# Patient Record
Sex: Female | Born: 1980 | Race: White | Hispanic: Yes | Marital: Married | Smoking: Never smoker
Health system: Southern US, Community
[De-identification: ages and names within clinical notes are randomized; demographics above are authoritative.]

## PROBLEM LIST (undated history)

## (undated) DIAGNOSIS — E559 Vitamin D deficiency, unspecified: Secondary | ICD-10-CM

## (undated) DIAGNOSIS — L709 Acne, unspecified: Secondary | ICD-10-CM

## (undated) DIAGNOSIS — E785 Hyperlipidemia, unspecified: Secondary | ICD-10-CM

## (undated) HISTORY — DX: Acne, unspecified: L70.9

## (undated) HISTORY — DX: Hyperlipidemia, unspecified: E78.5

## (undated) HISTORY — DX: Vitamin D deficiency, unspecified: E55.9

---

## 2007-10-10 ENCOUNTER — Inpatient Hospital Stay (HOSPITAL_COMMUNITY): Admission: AD | Admit: 2007-10-10 | Discharge: 2007-10-10 | Payer: Self-pay | Admitting: Gynecology

## 2007-10-10 LAB — CONVERTED CEMR LAB: Chlamydia, DNA Probe: NEGATIVE

## 2007-10-10 IMAGING — US US OB COMP LESS 14 WK
1 series · 14 of 20 positions shown · non-contrast
Comparison: none

CLINICAL DATA: 7 weeks.  By dates, bleeding

OBSTETRIC <14 WK ULTRASOUND
TECHNIQUE: Transabdominal ultrasound was performed for evaluation
of the gestation as well as the maternal uterus and adnexal
regions.

[Series 1: us ob comp less 14 wk · 0.26mm/px · 14 of 20 slices shown]
[im 1/20]
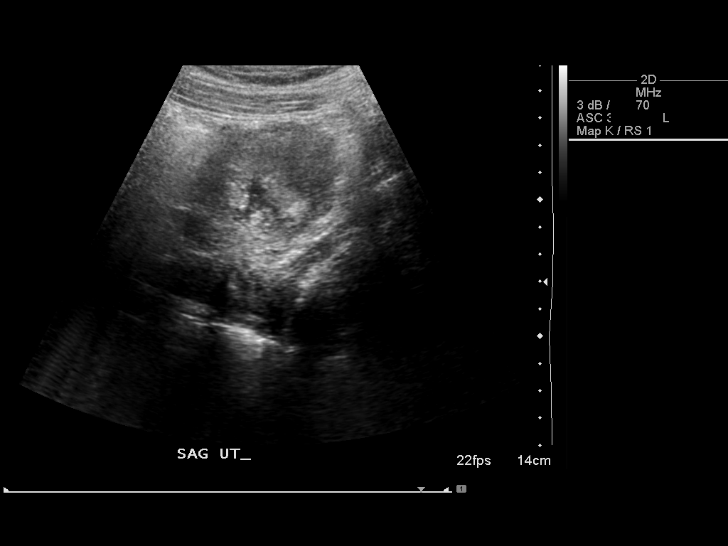
[im 3/20]
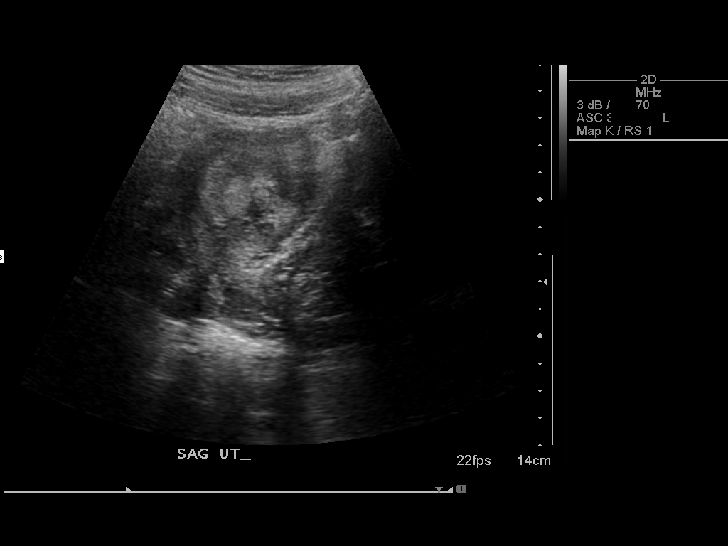
[im 4/20]
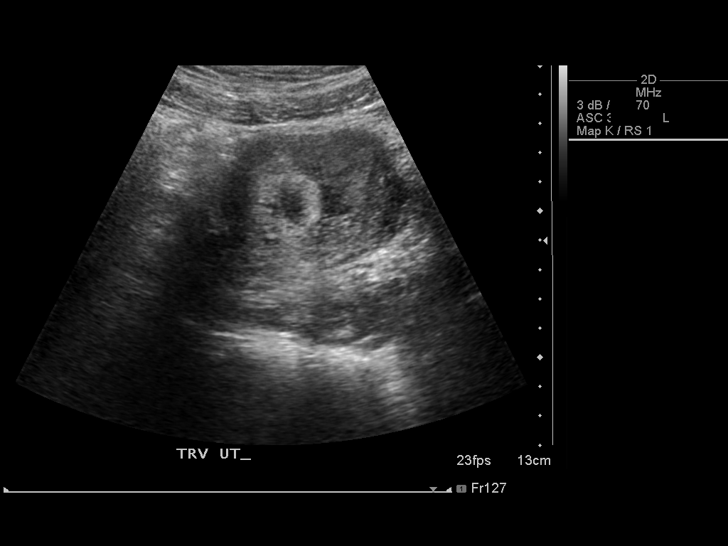
[im 6/20]
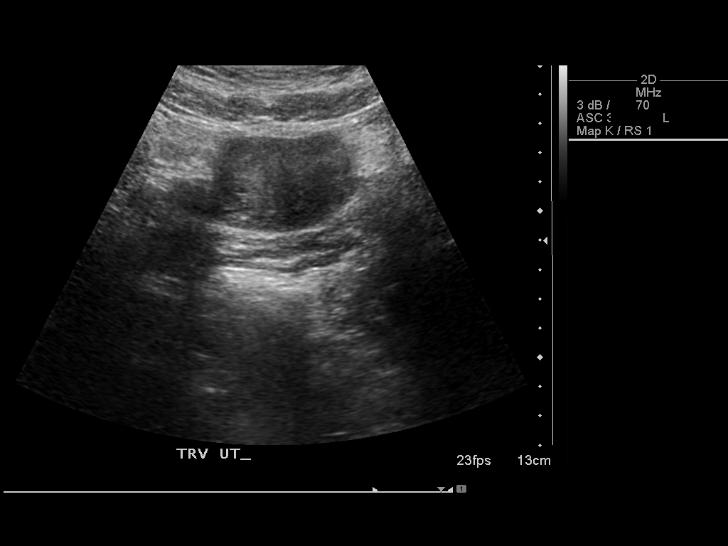
[im 7/20]
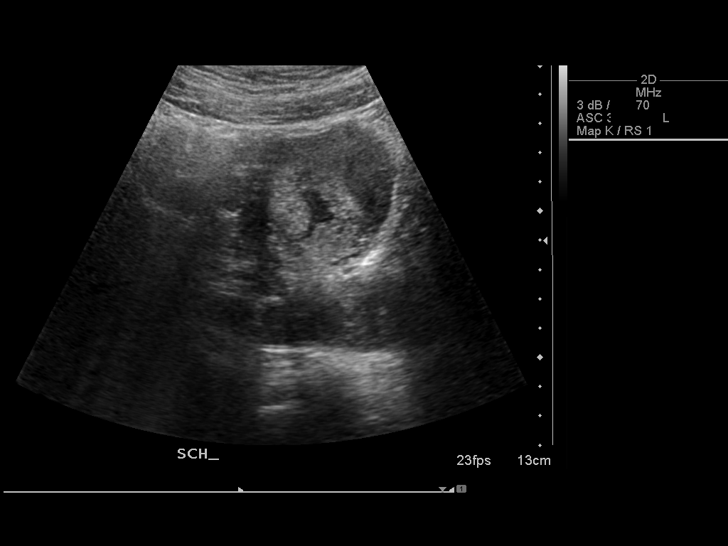
[im 8/20]
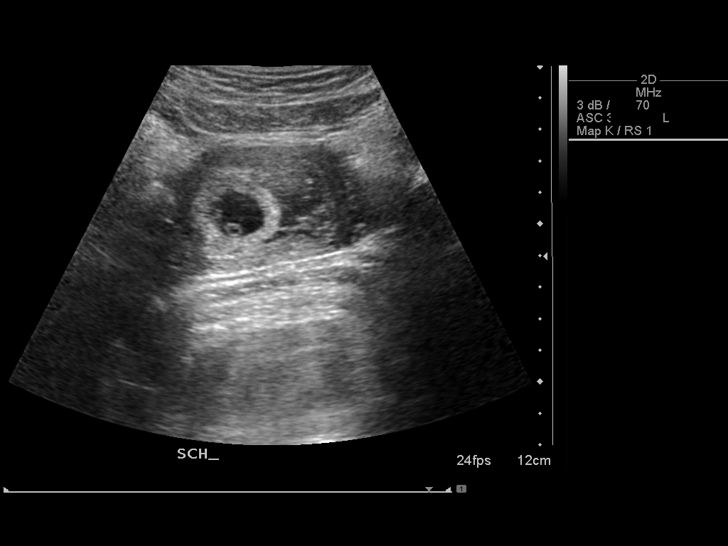
[im 10/20]
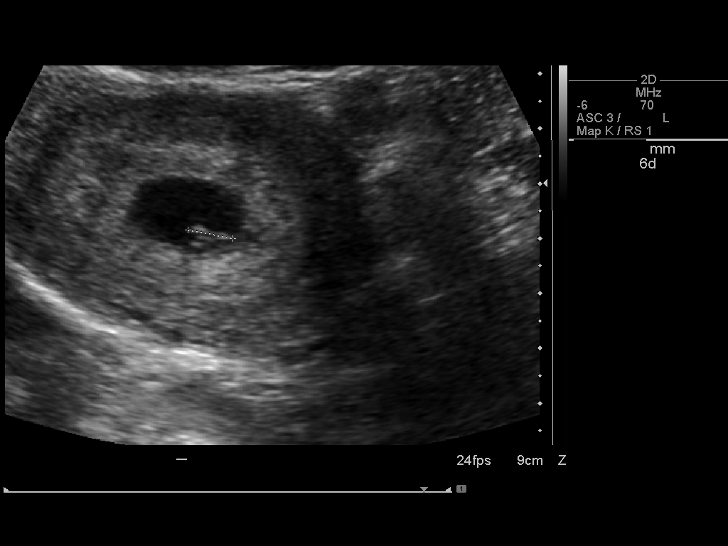
[im 11/20]
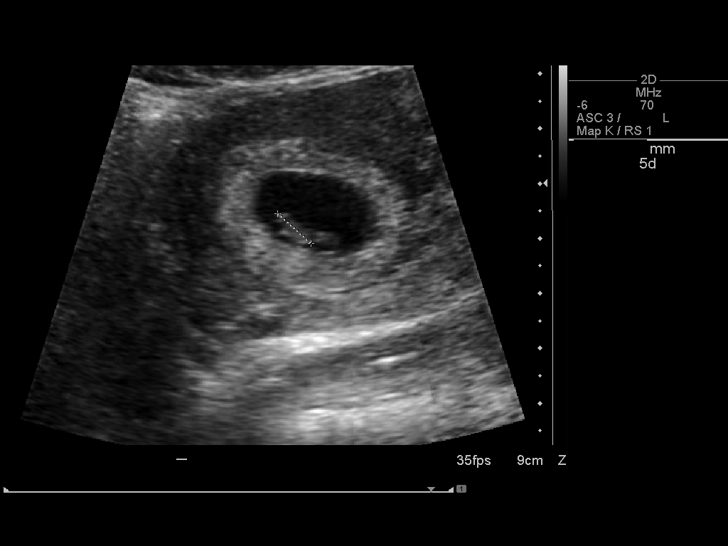
[im 13/20]
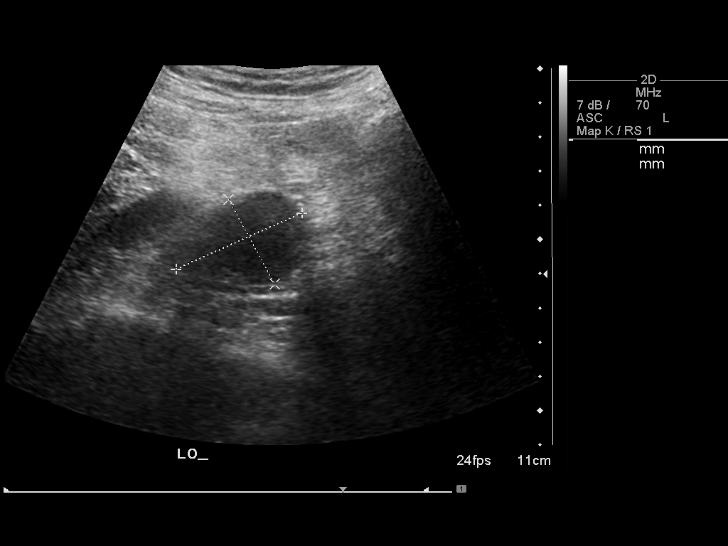
[im 14/20]
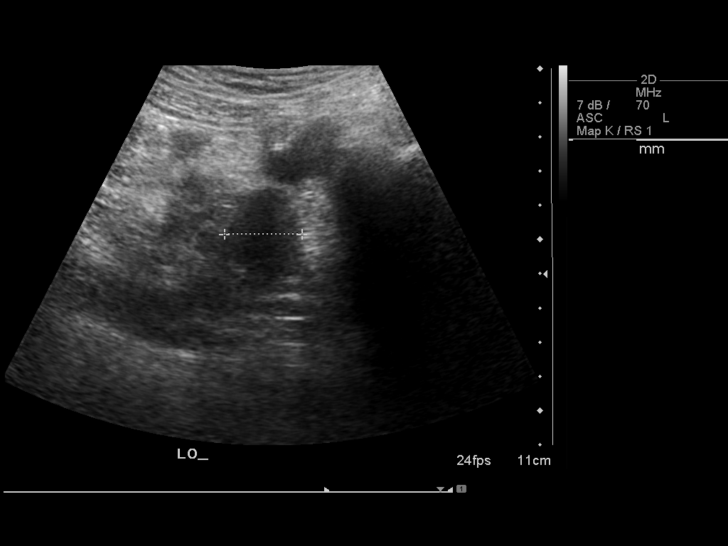
[im 16/20]
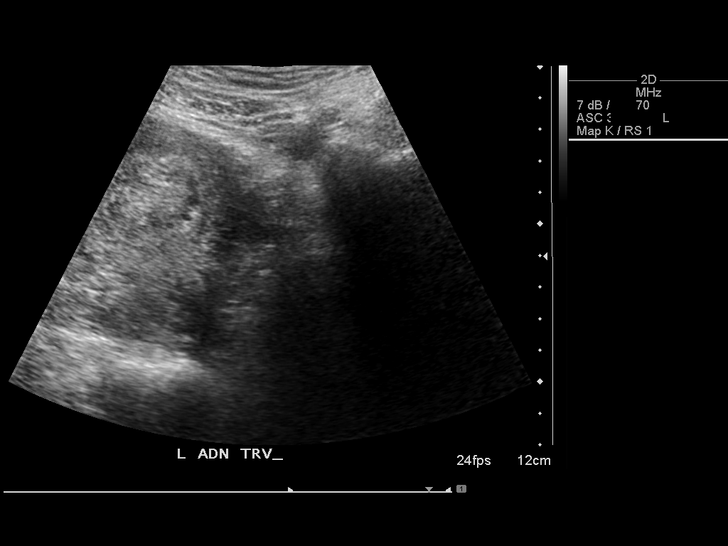
[im 17/20]
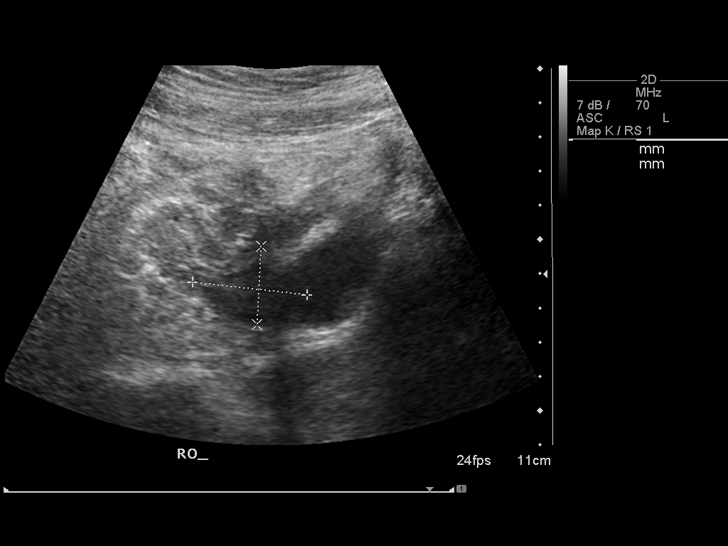
[im 18/20]
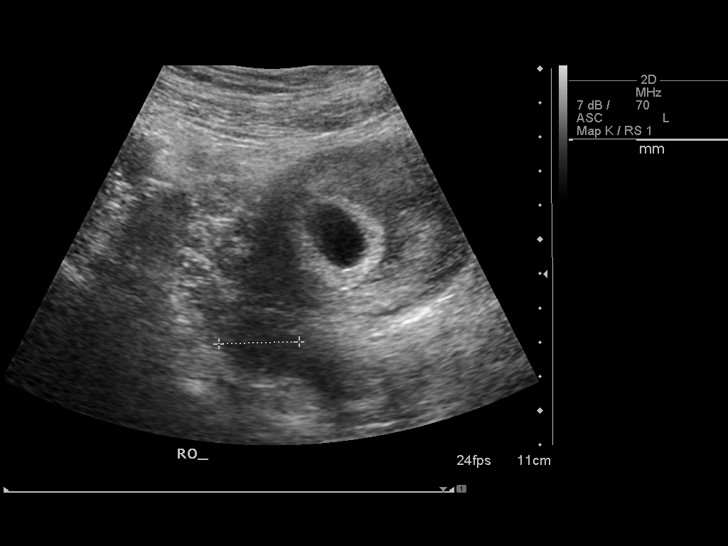
[im 20/20]
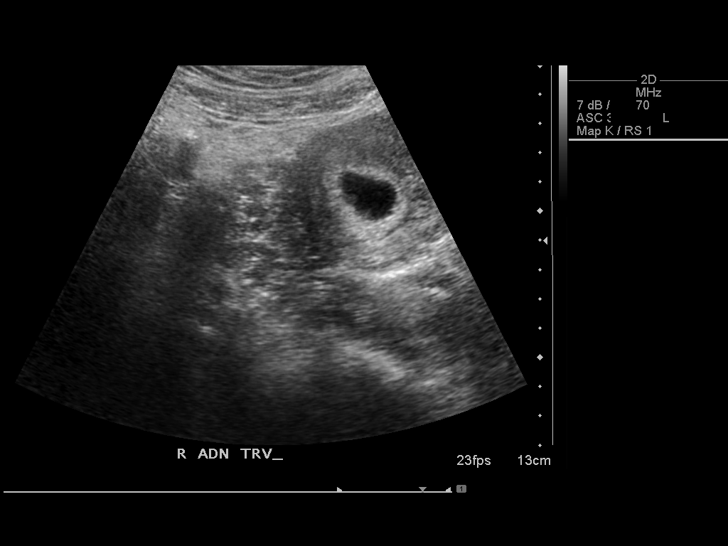

[14 of 20 positions shown; findings below may reference images not displayed]

A single intrauterine gestational sac is noted with yolk sac and
embryo.  Cardiac activity is detected measured at 126 beats per
minute.  By measurement of the crown rump length, estimated
gestational age is 6 weeks 6 days.

Intrauterine gestational sac: Single
Yolk sac: Yes
Embryo: Yes
Cardiac Activity: Yes
Heart Rate: 126 bpm

MSD: 8.3 mm  6w  6d
CRL:           w  d           US EDC:

Maternal uterus/Adnexae:
Small subchorionic hemorrhage is noted.  Both ovaries appear
normal.
IMPRESSION: Single living intrauterine fetal pole of the 6 weeks 6 days
gestation.

## 2007-12-09 ENCOUNTER — Ambulatory Visit: Payer: Self-pay | Admitting: Family Medicine

## 2007-12-09 ENCOUNTER — Encounter: Payer: Self-pay | Admitting: Family Medicine

## 2007-12-09 LAB — CONVERTED CEMR LAB
Antibody Screen: NEGATIVE
Basophils Absolute: 0 10*3/uL (ref 0.0–0.1)
Basophils Relative: 0 % (ref 0–1)
Eosinophils Absolute: 0.1 10*3/uL (ref 0.0–0.7)
Eosinophils Relative: 2 % (ref 0–5)
HCT: 38.3 % (ref 36.0–46.0)
Hepatitis B Surface Ag: NEGATIVE
Monocytes Absolute: 0.6 10*3/uL (ref 0.1–1.0)
Monocytes Relative: 7 % (ref 3–12)
Neutro Abs: 5.1 10*3/uL (ref 1.7–7.7)
Neutrophils Relative %: 68 % (ref 43–77)
Platelets: 217 10*3/uL (ref 150–400)
RDW: 14.1 % (ref 11.5–15.5)
Rubella: 88 intl units/mL — ABNORMAL HIGH

## 2007-12-16 ENCOUNTER — Encounter: Payer: Self-pay | Admitting: Family Medicine

## 2007-12-16 ENCOUNTER — Ambulatory Visit: Payer: Self-pay | Admitting: Family Medicine

## 2007-12-16 LAB — CONVERTED CEMR LAB
GC Probe Amp, Genital: NEGATIVE
Pap Smear: NORMAL
Protein, U semiquant: NEGATIVE

## 2008-01-06 ENCOUNTER — Ambulatory Visit (HOSPITAL_COMMUNITY): Admission: RE | Admit: 2008-01-06 | Discharge: 2008-01-06 | Payer: Self-pay | Admitting: Family Medicine

## 2008-01-06 ENCOUNTER — Encounter: Payer: Self-pay | Admitting: Family Medicine

## 2008-01-06 IMAGING — US US OB DETAIL+14 WK
1 of 2 series · 14 of 28 positions shown · non-contrast
Comparison: none

OBSTETRICAL ULTRASOUND:
 This ultrasound exam was performed in the [HOSPITAL] Ultrasound Department.  The OB US report was generated in the AS system, and faxed to the ordering physician.  This report is also available in [REDACTED] PACS.

[Series 1: us ob detail +14 wk · 66 acquisitions, 14 frames shown]
[im 1/66]
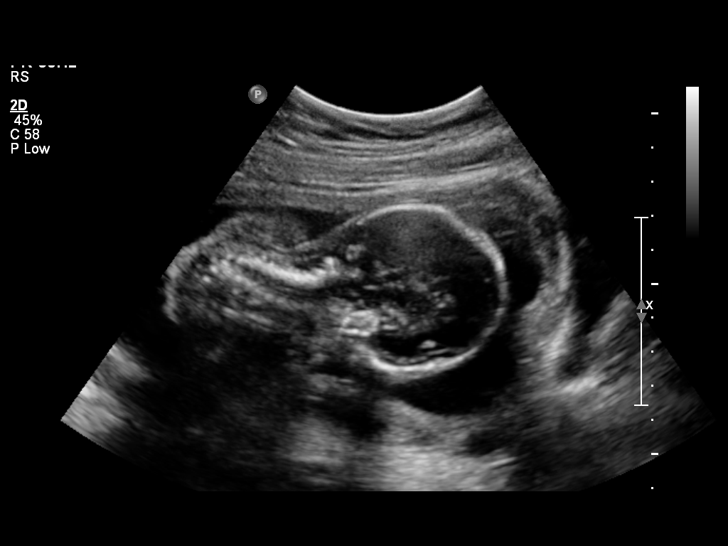
[im 6/66]
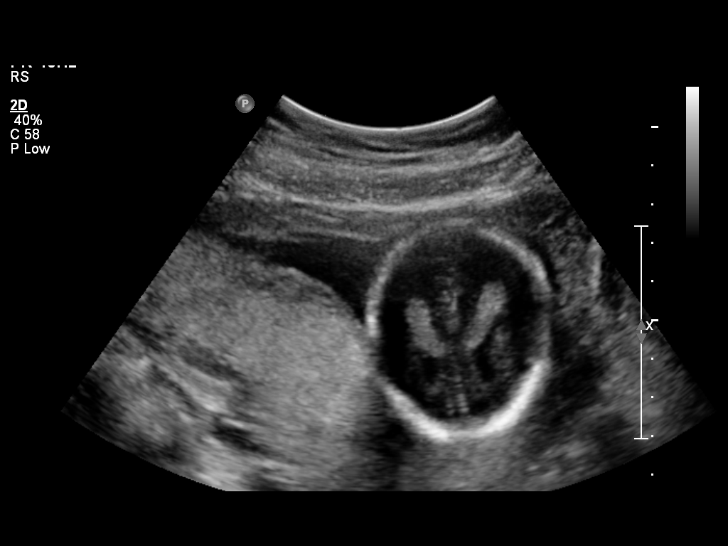
[im 11/66]
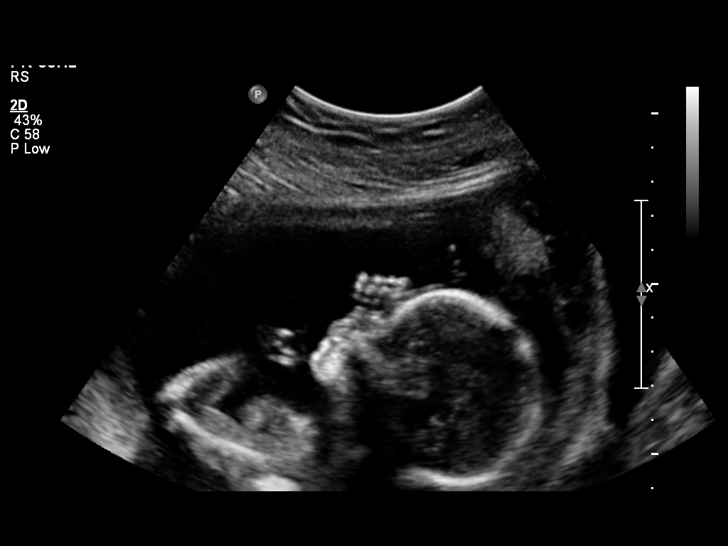
[im 16/66]
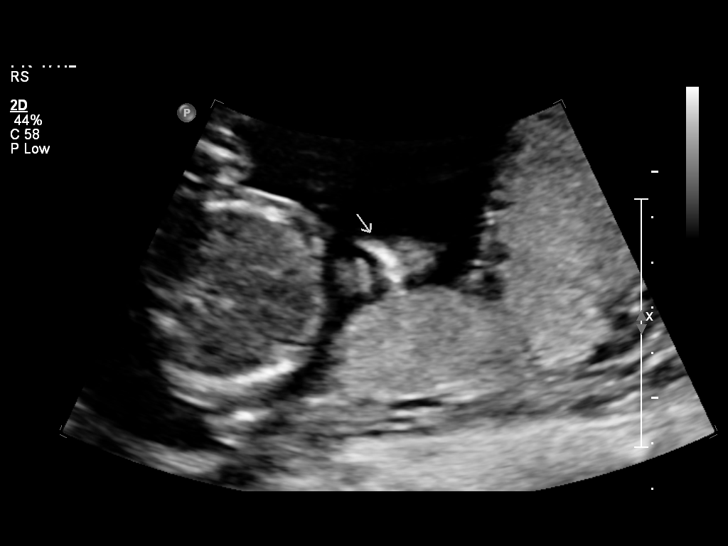
[im 21/66]
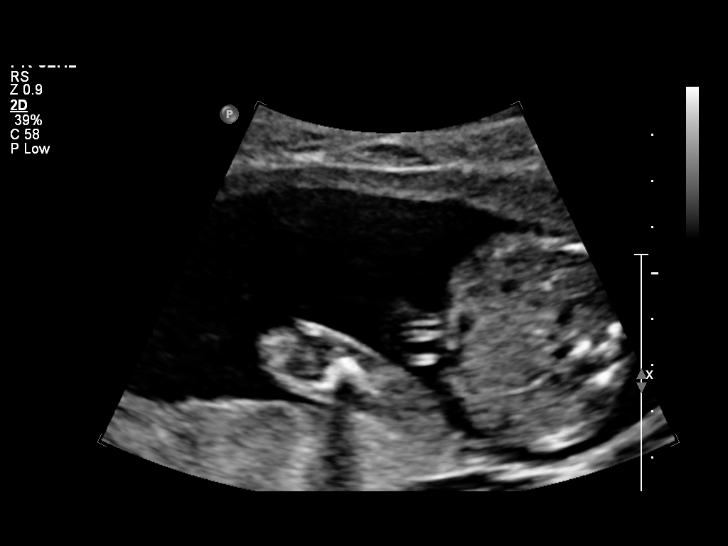
[im 26/66]
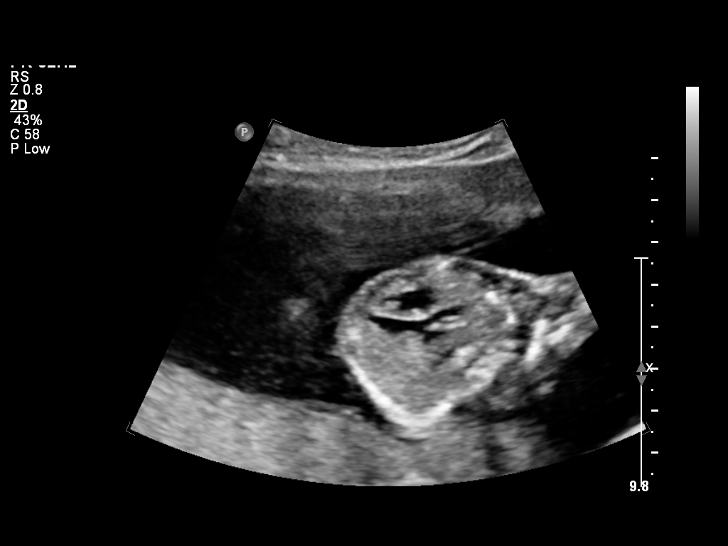
[im 31/66]
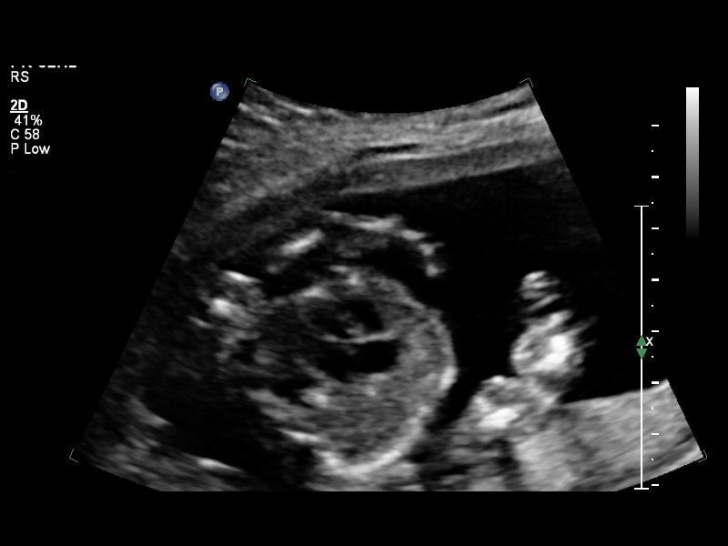
[im 36/66]
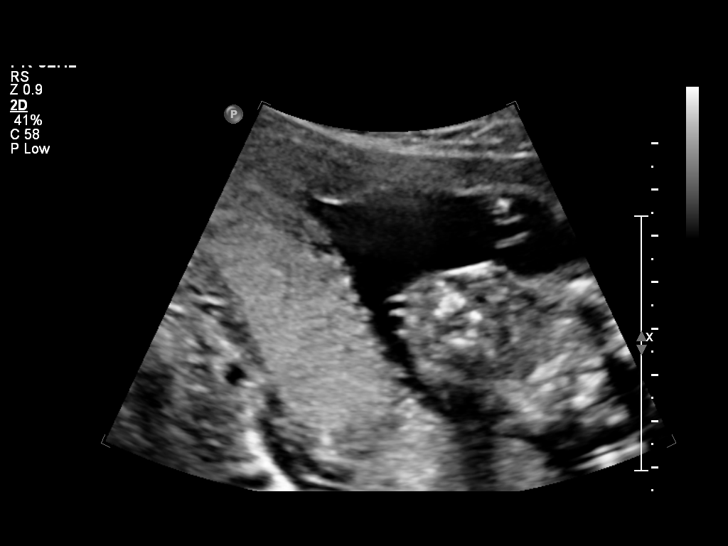
[im 41/66]
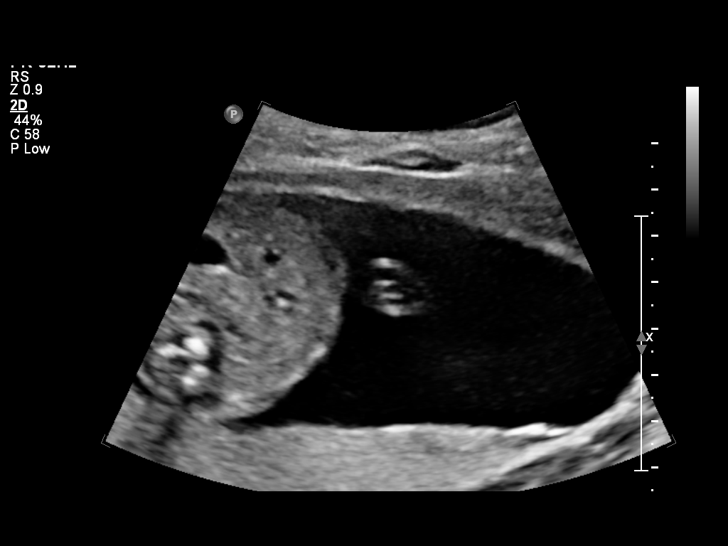
[im 46/66]
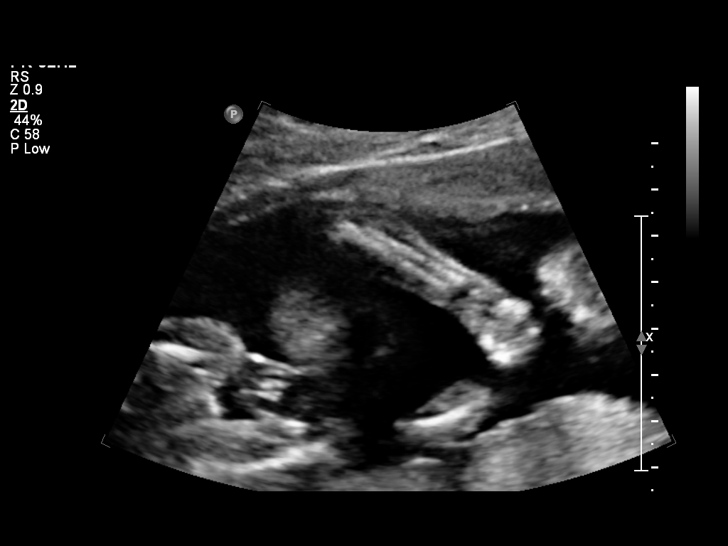
[im 51/66]
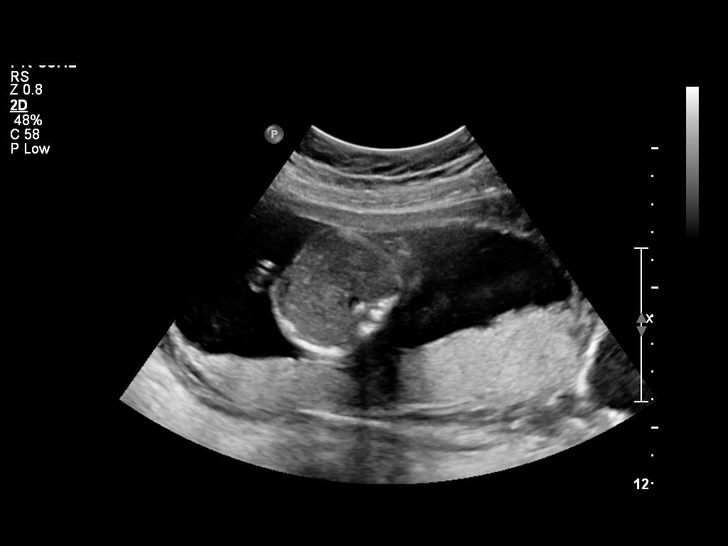
[im 56/66]
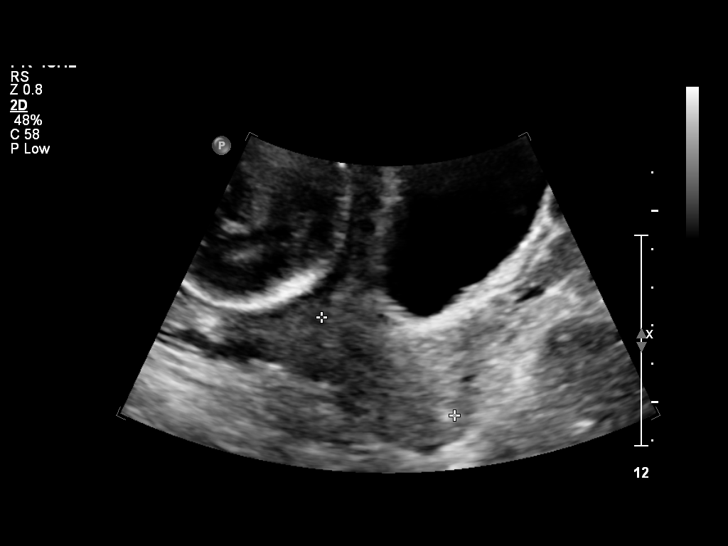
[im 61/66]
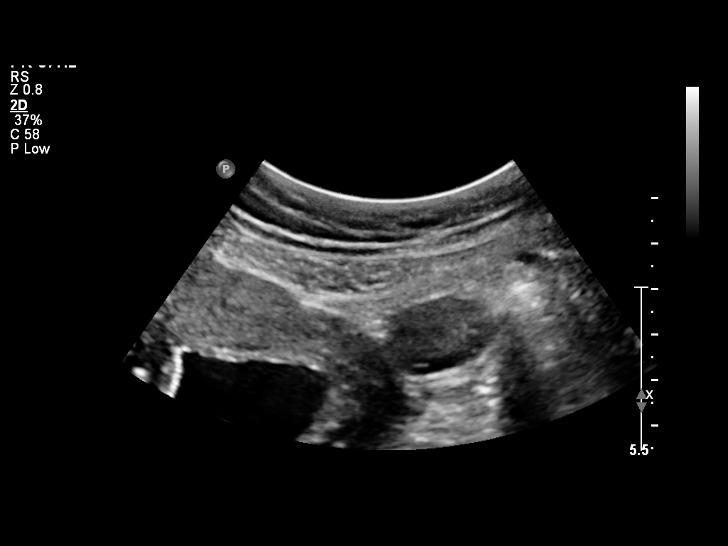
[im 66/66]
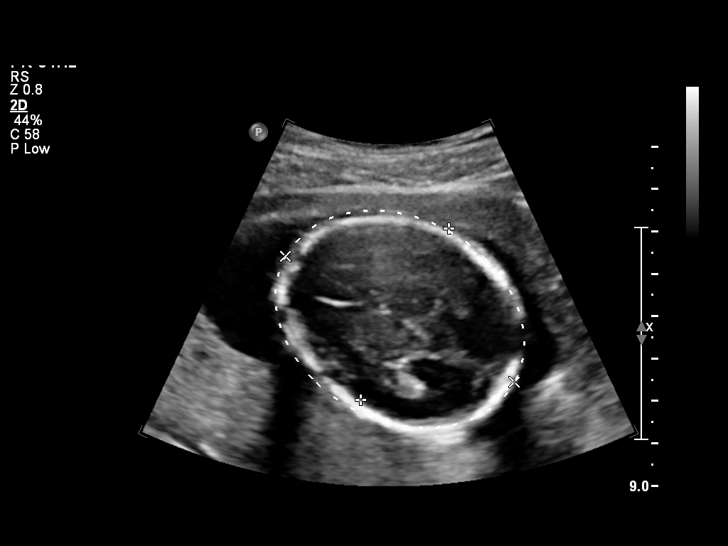

[14 of 28 positions shown; findings below may reference images not displayed]

IMPRESSION: See AS Obstetric US report.

## 2008-01-14 ENCOUNTER — Ambulatory Visit: Payer: Self-pay | Admitting: Family Medicine

## 2008-02-03 ENCOUNTER — Ambulatory Visit: Payer: Self-pay | Admitting: Family Medicine

## 2008-02-08 ENCOUNTER — Encounter: Payer: Self-pay | Admitting: Family Medicine

## 2008-02-21 ENCOUNTER — Ambulatory Visit: Payer: Self-pay | Admitting: Family Medicine

## 2008-02-21 ENCOUNTER — Telehealth: Payer: Self-pay | Admitting: *Deleted

## 2008-03-06 ENCOUNTER — Ambulatory Visit: Payer: Self-pay | Admitting: Family Medicine

## 2008-03-06 ENCOUNTER — Encounter: Payer: Self-pay | Admitting: Family Medicine

## 2008-03-08 LAB — CONVERTED CEMR LAB

## 2008-03-20 ENCOUNTER — Ambulatory Visit: Payer: Self-pay | Admitting: Family Medicine

## 2008-03-27 ENCOUNTER — Ambulatory Visit: Payer: Self-pay | Admitting: Family Medicine

## 2008-04-10 ENCOUNTER — Ambulatory Visit: Payer: Self-pay | Admitting: Family Medicine

## 2008-04-20 ENCOUNTER — Ambulatory Visit: Payer: Self-pay | Admitting: Family Medicine

## 2008-04-27 ENCOUNTER — Encounter: Payer: Self-pay | Admitting: Family Medicine

## 2008-04-27 ENCOUNTER — Ambulatory Visit: Payer: Self-pay | Admitting: Family Medicine

## 2008-04-27 LAB — CONVERTED CEMR LAB: TSH: 3.439 microintl units/mL (ref 0.350–4.50)

## 2008-05-10 ENCOUNTER — Encounter: Payer: Self-pay | Admitting: Family Medicine

## 2008-05-10 ENCOUNTER — Ambulatory Visit: Payer: Self-pay | Admitting: Family Medicine

## 2008-05-11 ENCOUNTER — Encounter: Payer: Self-pay | Admitting: Family Medicine

## 2008-05-11 LAB — CONVERTED CEMR LAB
Chlamydia, DNA Probe: NEGATIVE
GC Probe Amp, Genital: NEGATIVE

## 2008-05-15 ENCOUNTER — Ambulatory Visit: Payer: Self-pay | Admitting: Family Medicine

## 2008-05-17 ENCOUNTER — Telehealth: Payer: Self-pay | Admitting: Family Medicine

## 2008-05-17 ENCOUNTER — Inpatient Hospital Stay (HOSPITAL_COMMUNITY): Admission: AD | Admit: 2008-05-17 | Discharge: 2008-05-17 | Payer: Self-pay | Admitting: Obstetrics & Gynecology

## 2008-05-22 ENCOUNTER — Ambulatory Visit: Payer: Self-pay | Admitting: Family Medicine

## 2008-05-30 ENCOUNTER — Ambulatory Visit: Payer: Self-pay | Admitting: Family Medicine

## 2008-05-30 ENCOUNTER — Encounter: Payer: Self-pay | Admitting: Family Medicine

## 2008-05-30 LAB — CONVERTED CEMR LAB
Epithelial cells, urine: >20 /lpf
Glucose, Urine, Semiquant: NEGATIVE
Nitrite: NEGATIVE
Protein, U semiquant: 100
Urobilinogen, UA: 1
pH: 7

## 2008-05-31 ENCOUNTER — Inpatient Hospital Stay (HOSPITAL_COMMUNITY): Admission: AD | Admit: 2008-05-31 | Discharge: 2008-06-03 | Payer: Self-pay | Admitting: Obstetrics & Gynecology

## 2008-05-31 ENCOUNTER — Ambulatory Visit: Payer: Self-pay | Admitting: Obstetrics & Gynecology

## 2008-05-31 ENCOUNTER — Ambulatory Visit: Payer: Self-pay | Admitting: Family Medicine

## 2008-05-31 IMAGING — US US FETAL BPP W/O NONSTRESS
1 series · 14 of 14 positions shown · non-contrast
Comparison: none

OBSTETRICAL ULTRASOUND:
 This ultrasound exam was performed in the [HOSPITAL] Ultrasound Department.  The OB US report was generated in the AS system, and faxed to the ordering physician.  This report is also available in [REDACTED] PACS.

[Series 1: us fetal bpp w/o nonstress · 14 acquisitions, 14 frames shown]
[im 1/14]
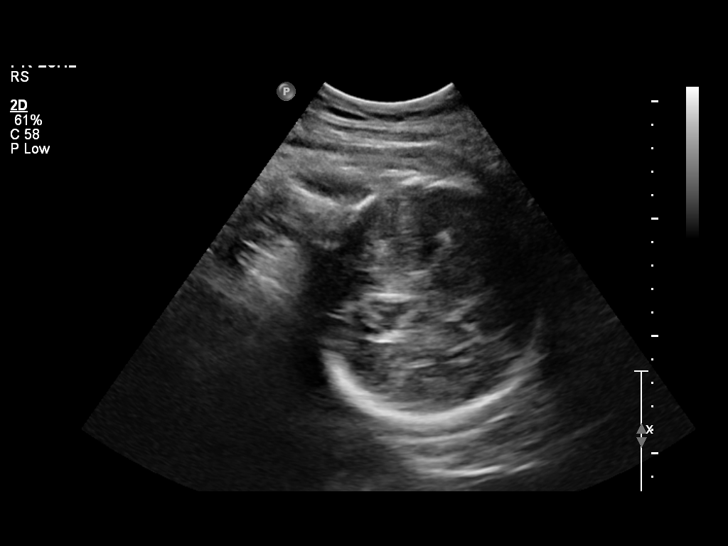
[im 2/14]
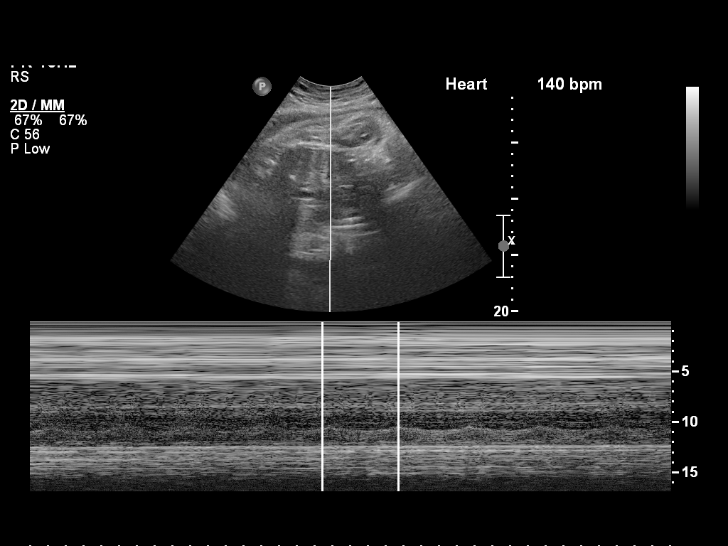
[im 3/14]
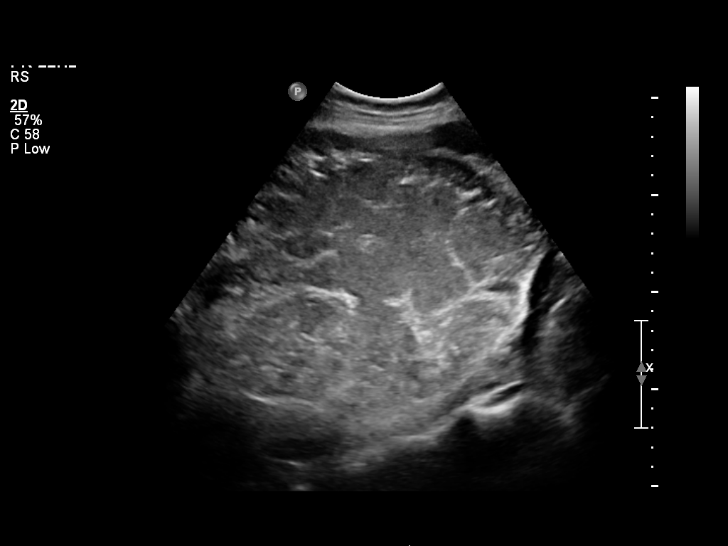
[im 4/14]
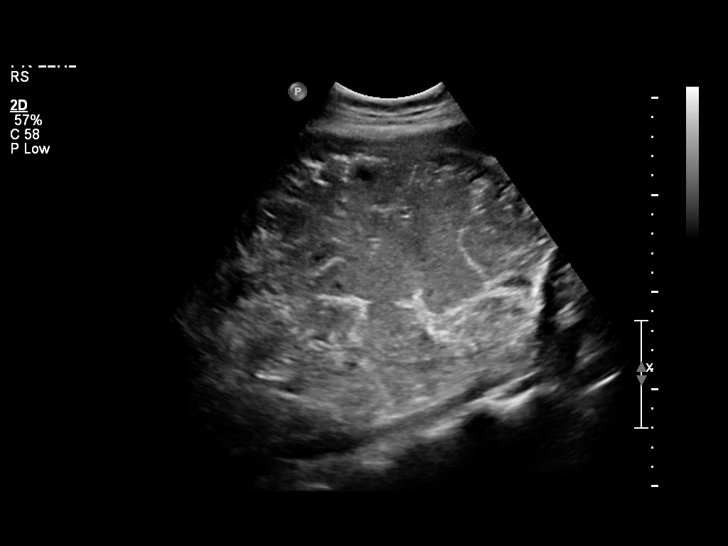
[im 5/14]
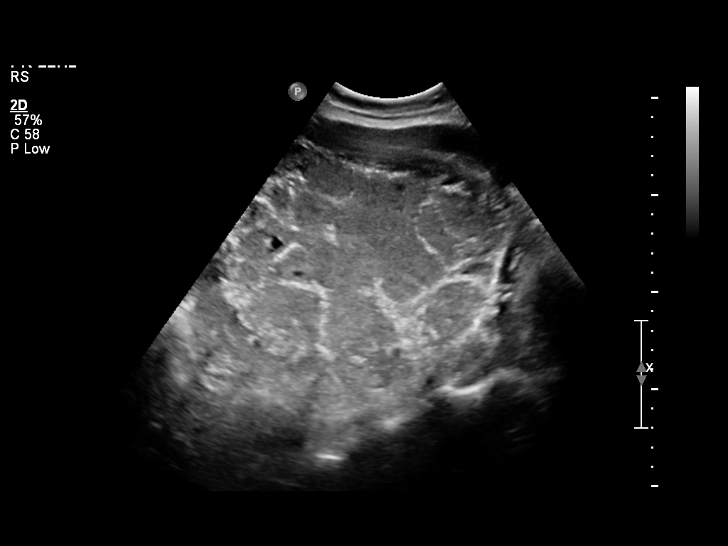
[im 6/14]
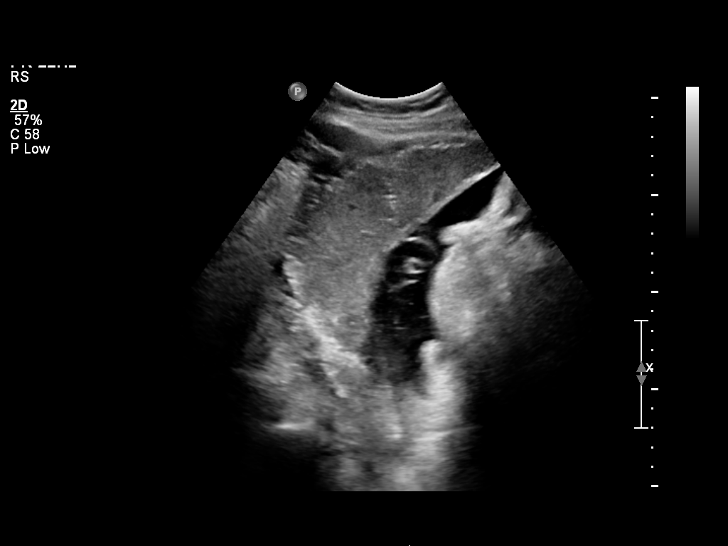
[im 7/14]
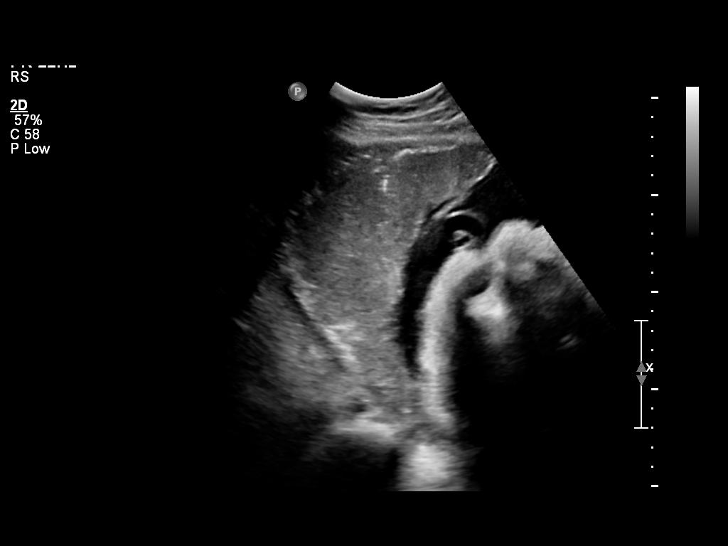
[im 8/14]
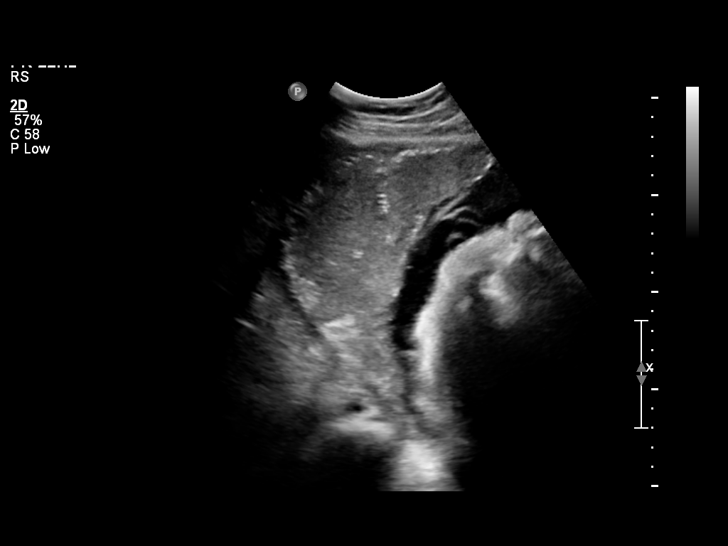
[im 9/14]
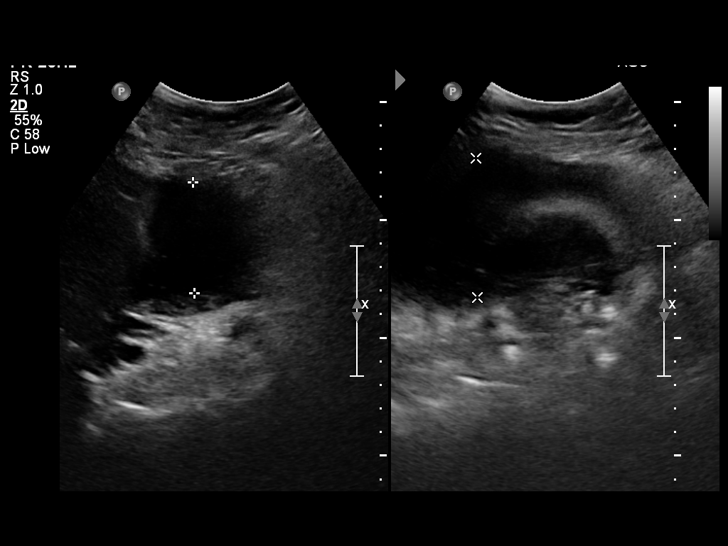
[im 10/14]
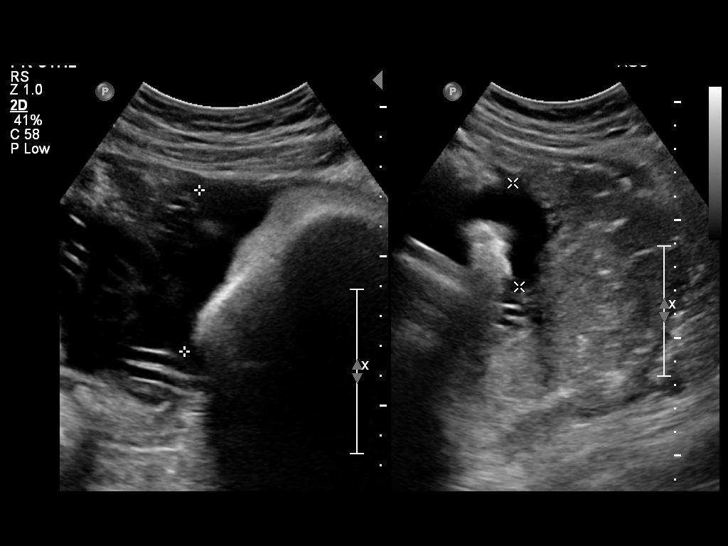
[im 11/14]
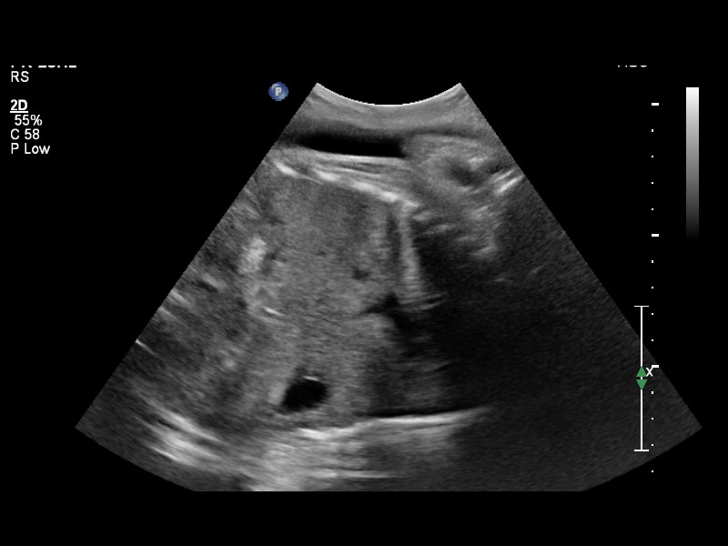
[im 12/14]
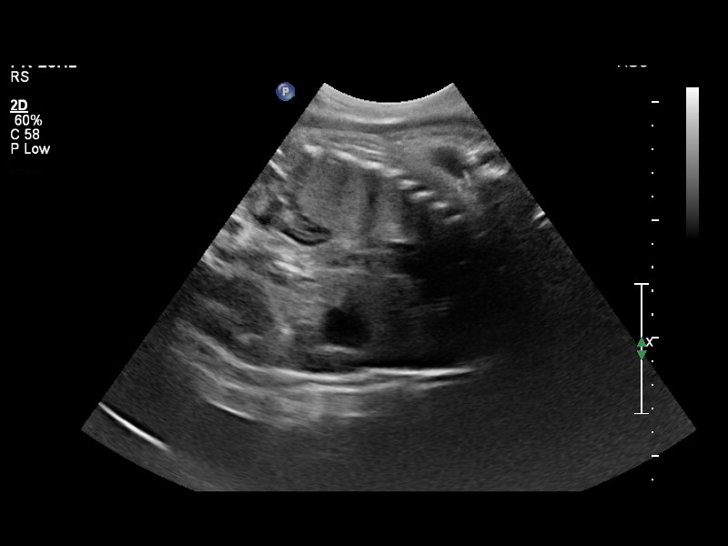
[im 13/14]
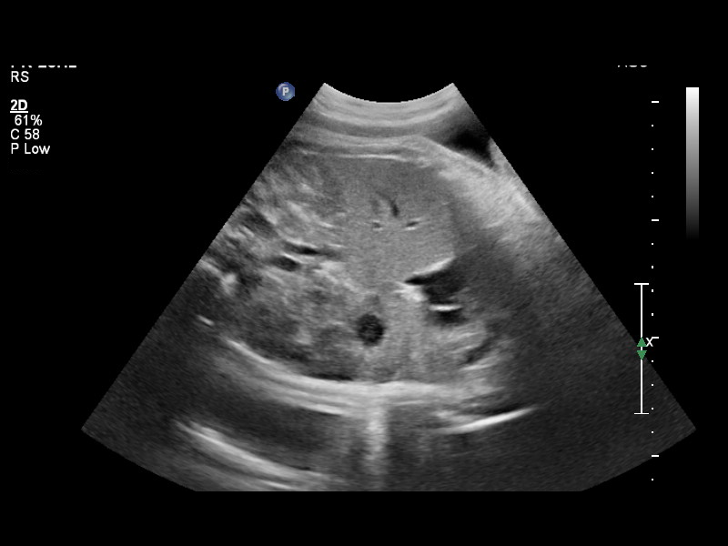
[im 14/14]
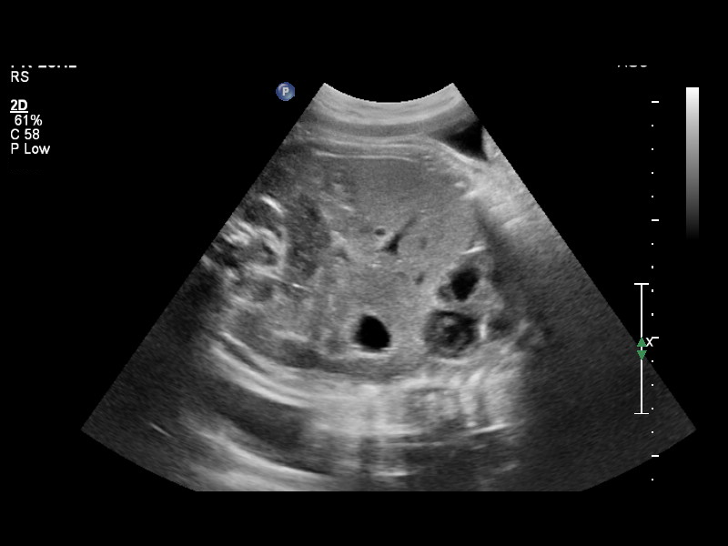

[14 of 14 positions shown; findings below may reference images not displayed]

IMPRESSION: See AS Obstetric US report.

## 2008-06-01 ENCOUNTER — Encounter: Payer: Self-pay | Admitting: Obstetrics & Gynecology

## 2008-06-03 ENCOUNTER — Telehealth (INDEPENDENT_AMBULATORY_CARE_PROVIDER_SITE_OTHER): Payer: Self-pay | Admitting: Family Medicine

## 2008-06-03 ENCOUNTER — Encounter: Payer: Self-pay | Admitting: Family Medicine

## 2008-06-05 ENCOUNTER — Ambulatory Visit: Payer: Self-pay | Admitting: Family Medicine

## 2008-06-05 LAB — CONVERTED CEMR LAB
Nitrite: NEGATIVE
Protein, U semiquant: 30
Specific Gravity, Urine: 1.015

## 2008-06-06 ENCOUNTER — Telehealth: Payer: Self-pay | Admitting: Family Medicine

## 2008-06-07 ENCOUNTER — Ambulatory Visit: Payer: Self-pay | Admitting: Family Medicine

## 2008-06-08 ENCOUNTER — Telehealth: Payer: Self-pay | Admitting: Family Medicine

## 2008-06-08 ENCOUNTER — Inpatient Hospital Stay (HOSPITAL_COMMUNITY): Admission: AD | Admit: 2008-06-08 | Discharge: 2008-06-08 | Payer: Self-pay | Admitting: Obstetrics & Gynecology

## 2008-06-08 ENCOUNTER — Ambulatory Visit: Payer: Self-pay | Admitting: Advanced Practice Midwife

## 2008-06-12 ENCOUNTER — Inpatient Hospital Stay (HOSPITAL_COMMUNITY): Admission: AD | Admit: 2008-06-12 | Discharge: 2008-06-12 | Payer: Self-pay | Admitting: Obstetrics & Gynecology

## 2008-06-12 ENCOUNTER — Ambulatory Visit: Payer: Self-pay | Admitting: Obstetrics and Gynecology

## 2008-06-14 ENCOUNTER — Ambulatory Visit: Payer: Self-pay | Admitting: Obstetrics and Gynecology

## 2008-06-16 ENCOUNTER — Ambulatory Visit: Payer: Self-pay | Admitting: Obstetrics & Gynecology

## 2008-06-19 ENCOUNTER — Ambulatory Visit: Payer: Self-pay | Admitting: Family Medicine

## 2008-06-26 ENCOUNTER — Ambulatory Visit: Payer: Self-pay | Admitting: Obstetrics & Gynecology

## 2008-07-05 ENCOUNTER — Ambulatory Visit: Payer: Self-pay | Admitting: Obstetrics & Gynecology

## 2008-07-24 ENCOUNTER — Ambulatory Visit: Payer: Self-pay | Admitting: Family Medicine

## 2008-07-24 ENCOUNTER — Encounter: Payer: Self-pay | Admitting: Family Medicine

## 2008-07-24 DIAGNOSIS — D649 Anemia, unspecified: Secondary | ICD-10-CM | POA: Insufficient documentation

## 2008-07-25 LAB — CONVERTED CEMR LAB
MCV: 84.8 fL (ref 78.0–100.0)
RBC: 4.75 M/uL (ref 3.87–5.11)

## 2008-08-08 ENCOUNTER — Ambulatory Visit: Payer: Self-pay | Admitting: Family Medicine

## 2008-08-08 ENCOUNTER — Encounter: Payer: Self-pay | Admitting: Family Medicine

## 2008-09-11 ENCOUNTER — Encounter: Payer: Self-pay | Admitting: Family Medicine

## 2008-09-11 ENCOUNTER — Ambulatory Visit: Payer: Self-pay | Admitting: Family Medicine

## 2008-09-13 LAB — CONVERTED CEMR LAB
Basophils Absolute: 0 10*3/uL (ref 0.0–0.1)
HCT: 40.8 % (ref 36.0–46.0)
Hemoglobin: 12.5 g/dL (ref 12.0–15.0)
Monocytes Absolute: 0.4 10*3/uL (ref 0.1–1.0)
Monocytes Relative: 7 % (ref 3–12)
Neutro Abs: 3 10*3/uL (ref 1.7–7.7)
RDW: 16 % — ABNORMAL HIGH (ref 11.5–15.5)

## 2010-05-26 ENCOUNTER — Encounter: Payer: Self-pay | Admitting: *Deleted

## 2010-07-18 LAB — CBC
HCT: 27 % — ABNORMAL LOW (ref 36.0–46.0)
Hemoglobin: 8.8 g/dL — ABNORMAL LOW (ref 12.0–15.0)
Platelets: 381 10*3/uL (ref 150–400)
RDW: 17.3 % — ABNORMAL HIGH (ref 11.5–15.5)
WBC: 14.4 10*3/uL — ABNORMAL HIGH (ref 4.0–10.5)

## 2010-07-18 LAB — WOUND CULTURE

## 2010-07-18 LAB — CULTURE, BLOOD (ROUTINE X 2): Culture: NO GROWTH

## 2010-07-23 LAB — COMPREHENSIVE METABOLIC PANEL
Albumin: 2.6 g/dL — ABNORMAL LOW (ref 3.5–5.2)
BUN: 6 mg/dL (ref 6–23)
Chloride: 109 mEq/L (ref 96–112)
Potassium: 4 mEq/L (ref 3.5–5.1)
Sodium: 136 mEq/L (ref 135–145)
Total Bilirubin: 0.3 mg/dL (ref 0.3–1.2)

## 2010-07-23 LAB — CBC
MCHC: 32.4 g/dL (ref 30.0–36.0)
MCHC: 33.2 g/dL (ref 30.0–36.0)
MCV: 80.7 fL (ref 78.0–100.0)
MCV: 80.9 fL (ref 78.0–100.0)
MCV: 81.2 fL (ref 78.0–100.0)
Platelets: 160 10*3/uL (ref 150–400)
Platelets: 181 10*3/uL (ref 150–400)
Platelets: 239 10*3/uL (ref 150–400)
RBC: 2.69 MIL/uL — ABNORMAL LOW (ref 3.87–5.11)
RBC: 4.27 MIL/uL (ref 3.87–5.11)
RDW: 16.9 % — ABNORMAL HIGH (ref 11.5–15.5)
WBC: 10.4 10*3/uL (ref 4.0–10.5)
WBC: 14.7 10*3/uL — ABNORMAL HIGH (ref 4.0–10.5)

## 2010-07-23 LAB — ANAEROBIC CULTURE

## 2010-07-23 LAB — RPR: RPR Ser Ql: NONREACTIVE

## 2010-07-23 LAB — WOUND CULTURE

## 2010-08-20 NOTE — Op Note (Signed)
[name], [name]                  ACCOUNT NO.:  [id number]   MEDICAL RECORD NO.:  [id number]          PATIENT TYPE:  INP   LOCATION:  9170                          FACILITY:  [facility]   PHYSICIAN:  Lazaro Arms, M.D.   DATE OF BIRTH:  17-Aug-1980   DATE OF PROCEDURE:  06/01/2008  DATE OF DISCHARGE:                               OPERATIVE REPORT   PREOPERATIVE DIAGNOSES:  1. Intrauterine pregnancy of 40-3/7th weeks' gestation.  2. Persistent fetal tachycardia.  3. Chorioamnionitis.   POSTOPERATIVE DIAGNOSES:  1. Intrauterine pregnancy of 40-3/7th weeks' gestation.  2. Persistent fetal tachycardia.  3. Chorioamnionitis.   PROCEDURE:  Primary low transverse cesarean section.   SURGEON:  Lazaro Arms, MD   ANESTHESIA:  Epidural.   FINDINGS:  Delivered a viable female at 41 with Apgars 9 and 10.  Cord  gas 7.24.   PROCEDURE:  I proceeded with C-section because the fetal heart rate had  been in the 170s and pretty flat for the last couple of hours.  I knew  that the flat strip was because of the maternal fever, and she was  treated with chorioamnionitis beginning at 0200 with ampicillin and  gentamicin and also given a couple of doses of Tylenol, but we could not  get her fever down.  So as a result, fetal heart rate stayed high.  She  was still only 7 cm and felt she was probably 3-4 hours away from  delivery at best, and so I decided to proceed with the cesarean section.   DESCRIPTION OF OPERATION:  The patient was taken to the operating room,  placed on the supine position, and had her epidural dosed up, prepped  and draped in usual sterile fashion.  A Pfannenstiel skin incision was  made, carried down sharply to the rectus fascia, scored in the midline,  and extended laterally.  The fascia was taken off the muscle superiorly  and inferiorly without difficulty.  The muscles were divided and the  peritoneal cavity was entered.  A bladder blade was placed.  The  vesicouterine  serosal flap was created.  A low-transverse hysterotomy  incision was made over this incision and delivered a viable female  infant at 32 with Apgars of 9 and 10, weight to be determined in  Nursery.   A three-vessel cord, cord gas of 7.24, and cord blood was sent.  The  fetal side of the placenta was cultured aerobically and anaerobically,  and the placenta was sent to pathology as stated.  The uterus  exteriorized, wipe cleaned with a clean lap pad and irrigated, closed in  two layers, first being running interlocking layer and the second being  the imbricating layer.  The pelvis was then irrigated vigorously.  She  was given a dose of Methergine for hypotonia.  The muscles and  peritoneum reapproximated loosely.  The fascia was closed using 0-Vicryl  running.  Subcutaneous tissues were made hemostatic and irrigated.  The  skin was closed using skin staples.  The patient tolerated this  procedure well.  She experienced 500 mL of  blood loss, taken to the  recovery room in good and stable condition.  All counts correct x3.   The patient will be maintained on ampicillin and gentamicin  postoperatively for chorioamnionitis, and she will be getting CBC in the  next couple of mornings to evaluate her white count.      Lazaro Arms, M.D.  Electronically Signed     LHE/MEDQ  D:  06/01/2008  T:  06/01/2008  Job:  [id number]

## 2010-08-20 NOTE — Discharge Summary (Signed)
[name], [name]                  ACCOUNT NO.:  [id number]   MEDICAL RECORD NO.:  [id number]          PATIENT TYPE:  INP   LOCATION:  9107                          FACILITY:  [facility]   PHYSICIAN:  Norton Blizzard, MD    DATE OF BIRTH:  January 30, 1981   DATE OF ADMISSION:  05/31/2008  DATE OF DISCHARGE:  06/03/2008                               DISCHARGE SUMMARY   DISCHARGE DIAGNOSES:  1. Status post intrauterine pregnancy at 40-3/7 weeks' gestation,      status post low-transverse cesarean section for persistent fetal      tachycardia.  2. Chorioamnionitis.   DISCHARGE LABS:  Hemoglobin was 7.2, white blood cell count was 14.7,  and platelets are 181.  Culture of the placenta did show few group B  strep.   BRIEF HOSPITAL COURSE:  This is a 30 year old Hispanic female that was  at G1, P1 at 40-3/7 weeks, that was admitted for spontaneous rupture of  membranes and labor.  During her labor, she developed fever up to 102.2.  She was started on gentamicin and ampicillin and was given doses of  Tylenol and IV fluid boluses.  Despite these measures, fetal heart rate  remained in the 170s-180s.  When she was checked, her cervix was found  to be 7 cm and it was felt that she would not delivery anytime soon;  therefore, it was decided that she would proceed to C-section.  Procedure was done by Dr. Despina Hidden and assisted by Dr. Burnadette Pop.  A viable  female was delivered; weight was 9 pounds 2 ounces with Apgar scores of 9  and 10  Placenta was inspected and cultured for infection.  The patient  was taken to Mother/Baby and continued on antibiotics for 24 hours  postpartum.  She remained afebrile.  Vital signs stable.  White blood  cell count remained stable.  Her hemoglobin level was 7.2, but she  remained asymptomatic.  On postop day #2 she was tolerating a regular  diet, passing flatus, ambulating and voiding without difficulty.  She  was deemed stable for discharge to home.   DISCHARGE  MEDICATIONS:  1. Prenatal vitamin 1 tablet p.o. daily.  2. Colace 100 mg p.o. at b.i.d.  3. Percocet 5/325 one tablet q.4-6 h. p.r.n. for pain.  4. Iron sulfate 325 one tablet p.o. b.i.d.   FOLLOWUP PLAN:  Followup in clinic on Monday for staple removal.      Jill Ivan, MD      Norton Blizzard, MD  Electronically Signed   KL/MEDQ  D:  06/03/2008  T:  06/03/2008  Job:  [id number]

## 2011-01-02 LAB — URINALYSIS, ROUTINE W REFLEX MICROSCOPIC
Glucose, UA: NEGATIVE
pH: 5.5

## 2011-01-02 LAB — WET PREP, GENITAL: Yeast Wet Prep HPF POC: NONE SEEN

## 2011-01-02 LAB — POCT PREGNANCY, URINE
Operator id: 11741
Preg Test, Ur: POSITIVE

## 2011-01-02 LAB — CBC
MCHC: 34.2
RBC: 4.45

## 2011-01-02 LAB — ABO/RH: ABO/RH(D): O POS

## 2011-01-02 LAB — HCG, QUANTITATIVE, PREGNANCY: hCG, Beta Chain, Quant, S: 57972 — ABNORMAL HIGH

## 2011-01-07 LAB — GLUCOSE, CAPILLARY: Glucose-Capillary: 105 — ABNORMAL HIGH

## 2012-01-06 ENCOUNTER — Encounter (HOSPITAL_COMMUNITY): Payer: Self-pay

## 2012-01-06 ENCOUNTER — Other Ambulatory Visit: Payer: Self-pay | Admitting: Obstetrics and Gynecology

## 2012-01-06 ENCOUNTER — Ambulatory Visit (HOSPITAL_COMMUNITY)
Admission: RE | Admit: 2012-01-06 | Discharge: 2012-01-06 | Disposition: A | Discharge status: Home / Self Care | Payer: Self-pay | Source: Ambulatory Visit | Attending: Obstetrics and Gynecology | Admitting: Obstetrics and Gynecology

## 2012-01-06 DIAGNOSIS — Z1239 Encounter for other screening for malignant neoplasm of breast: Secondary | ICD-10-CM

## 2012-01-06 DIAGNOSIS — N6320 Unspecified lump in the left breast, unspecified quadrant: Secondary | ICD-10-CM

## 2012-01-06 DIAGNOSIS — N644 Mastodynia: Secondary | ICD-10-CM

## 2012-01-09 ENCOUNTER — Ambulatory Visit
Admission: RE | Admit: 2012-01-09 | Discharge: 2012-01-09 | Disposition: A | Discharge status: Home / Self Care | Payer: No Typology Code available for payment source | Source: Ambulatory Visit | Attending: Obstetrics and Gynecology | Admitting: Obstetrics and Gynecology

## 2012-01-09 DIAGNOSIS — N6320 Unspecified lump in the left breast, unspecified quadrant: Secondary | ICD-10-CM

## 2012-01-09 DIAGNOSIS — N644 Mastodynia: Secondary | ICD-10-CM

## 2012-01-26 NOTE — Progress Notes (Signed)
Detailed notes scanned into EPIC under media. 

## 2012-01-26 NOTE — Patient Instructions (Signed)
Detailed notes scanned into EPIC under media. 

## 2012-03-17 NOTE — Addendum Note (Signed)
Encounter addended by: Saintclair Halsted, RN on: 03/17/2012  2:43 PM<BR>     Documentation filed: Charges VN

## 2012-09-30 ENCOUNTER — Encounter (HOSPITAL_COMMUNITY): Payer: Self-pay | Admitting: *Deleted

## 2012-09-30 ENCOUNTER — Emergency Department (HOSPITAL_COMMUNITY)
Admission: EM | Admit: 2012-09-30 | Discharge: 2012-09-30 | Disposition: A | Discharge status: Home / Self Care | Payer: Self-pay | Attending: Emergency Medicine | Admitting: Emergency Medicine

## 2012-09-30 DIAGNOSIS — J329 Chronic sinusitis, unspecified: Secondary | ICD-10-CM | POA: Insufficient documentation

## 2012-09-30 DIAGNOSIS — H539 Unspecified visual disturbance: Secondary | ICD-10-CM | POA: Insufficient documentation

## 2012-09-30 DIAGNOSIS — R42 Dizziness and giddiness: Secondary | ICD-10-CM | POA: Insufficient documentation

## 2012-09-30 DIAGNOSIS — R05 Cough: Secondary | ICD-10-CM | POA: Insufficient documentation

## 2012-09-30 DIAGNOSIS — B309 Viral conjunctivitis, unspecified: Secondary | ICD-10-CM | POA: Insufficient documentation

## 2012-09-30 DIAGNOSIS — J019 Acute sinusitis, unspecified: Secondary | ICD-10-CM

## 2012-09-30 DIAGNOSIS — R51 Headache: Secondary | ICD-10-CM | POA: Insufficient documentation

## 2012-09-30 DIAGNOSIS — R059 Cough, unspecified: Secondary | ICD-10-CM | POA: Insufficient documentation

## 2012-09-30 DIAGNOSIS — H5789 Other specified disorders of eye and adnexa: Secondary | ICD-10-CM | POA: Insufficient documentation

## 2012-09-30 DIAGNOSIS — R197 Diarrhea, unspecified: Secondary | ICD-10-CM | POA: Insufficient documentation

## 2012-09-30 DIAGNOSIS — Z792 Long term (current) use of antibiotics: Secondary | ICD-10-CM | POA: Insufficient documentation

## 2012-09-30 MED ORDER — AMOXICILLIN-POT CLAVULANATE 875-125 MG PO TABS: 1.0000 | ORAL_TABLET | Freq: Two times a day (BID) | ORAL | Status: DC

## 2012-09-30 MED ORDER — AMOXICILLIN-POT CLAVULANATE 600-42.9 MG/5ML PO SUSR: 875.0000 mg | Freq: Two times a day (BID) | ORAL | Status: DC

## 2012-09-30 MED ORDER — TETRACAINE HCL 0.5 % OP SOLN
2.0000 [drp] | Freq: Once | OPHTHALMIC | Status: AC
  Administered 2012-09-30: 2 [drp] via OPHTHALMIC
  Filled 2012-09-30: qty 2

## 2012-09-30 MED ORDER — NAPHAZOLINE HCL 0.1 % OP SOLN: 1.0000 [drp] | Freq: Four times a day (QID) | OPHTHALMIC | Status: DC | PRN

## 2012-09-30 MED ORDER — FLUORESCEIN SODIUM 1 MG OP STRP
1.0000 | ORAL_STRIP | Freq: Once | OPHTHALMIC | Status: AC
  Administered 2012-09-30: 1 via OPHTHALMIC
  Filled 2012-09-30: qty 1

## 2012-09-30 NOTE — ED Notes (Signed)
Pt reports that her bilateral eyes have been itching x 1 week.  Pt reports that when she bends down and stands back up quickly she feels dizzy.  pts (L) eye noted to be more red than her (R). Denies eye drainage.

## 2012-09-30 NOTE — ED Provider Notes (Signed)
History    This chart was scribed for non-physician practitioner Dierdre Forth, PA working with Gilda Crease, * by Quintella Reichert, ED Scribe. This patient was seen in room TR04C/TR04C and the patient's care was started at 10:25 PM .   CSN: [id number]  Arrival date & time 09/30/12  2024    Chief Complaint  Patient presents with  . Eye Problem    The history is provided by the patient and a relative. No language interpreter was used.    HPI Comments: [name] is a 32 y.o. female who presents to the Emergency Department complaining of eye discomfort.  Pt reports that 8 days ago she began to experience a sensation of "heaviness" to both eyes, and several days later they became red.  She notes that it feels like there are "dots" in her eyes.  Eyes are not itchy or painful.  For 3 days she has also experienced dizziness when she bends down and stands back up quickly.  Today she also began to experience mild facial pain under the left eye.  She has also been experiencing intermittent blurred vision to the left eye, intermittent mild headache, one episode of diarrhea today, and a mild cough.  She denies sinus congestion, rhinorrhea, sore throat, SOB, emesis, rash, or any other associated symptoms.  She denies recent sick contact.     History reviewed. No pertinent past medical history.  Past Surgical History  Procedure Laterality Date  . Cesarean section     History reviewed. No pertinent family history. History  Substance Use Topics  . Smoking status: Never Smoker   . Smokeless tobacco: Not on file  . Alcohol Use: Yes     Comment: socially   OB History   Grav Para Term Preterm Abortions TAB SAB Ect Mult Living                   Review of Systems  Constitutional: Negative for fever, diaphoresis, appetite change, fatigue and unexpected weight change.  HENT: Negative for mouth sores and neck stiffness.   Eyes: Positive for redness and visual disturbance.  Negative for pain, discharge and itching.       Sensation of heaviness  Respiratory: Positive for cough. Negative for chest tightness, shortness of breath and wheezing.   Cardiovascular: Negative for chest pain.  Gastrointestinal: Positive for diarrhea. Negative for abdominal pain and constipation.  Endocrine: Negative for polydipsia, polyphagia and polyuria.  Genitourinary: Negative for dysuria, urgency, frequency and hematuria.  Musculoskeletal: Negative for back pain.  Skin: Negative for rash.  Allergic/Immunologic: Negative for immunocompromised state.  Neurological: Positive for dizziness and headaches. Negative for syncope and light-headedness.  Hematological: Does not bruise/bleed easily.  Psychiatric/Behavioral: Negative for sleep disturbance. The patient is not nervous/anxious.       Allergies  Review of patient's allergies indicates no known allergies.  Home Medications   Current Outpatient Rx  Name  Route  Sig  Dispense  Refill  . tetrahydrozoline-zinc (VISINE-AC) 0.05-0.25 % ophthalmic solution   Both Eyes   Place 2 drops into both eyes 3 (three) times daily as needed (dry eyes).         Marland Kitchen amoxicillin-clavulanate (AUGMENTIN) 875-125 MG per tablet   Oral   Take 1 tablet by mouth every 12 (twelve) hours.   14 tablet   0   . naphazoline (NAPHCON) 0.1 % ophthalmic solution   Both Eyes   Place 1 drop into both eyes 4 (four) times daily as needed.  15 mL   0    BP 125/86  Pulse 112  Temp(Src) 98.2 F (36.8 C) (Oral)  Resp 20  SpO2 100%  Physical Exam  Nursing note and vitals reviewed. Constitutional: She appears well-developed and well-nourished. No distress.  HENT:  Head: Normocephalic and atraumatic.  Nose: Mucosal edema present. No rhinorrhea. Right sinus exhibits no maxillary sinus tenderness and no frontal sinus tenderness. Left sinus exhibits maxillary sinus tenderness. Left sinus exhibits no frontal sinus tenderness.  Mouth/Throat: Oropharynx is  clear and moist. No oropharyngeal exudate.  Eyes: Right conjunctiva is injected. Left conjunctiva is injected. No scleral icterus.  Mild erythema of the conjunctiva bilaterally. Mild scleral injection. No hyphema. No subconjunctival hemorrhage.  Neck: Normal range of motion. Neck supple.  Cardiovascular: Regular rhythm and intact distal pulses.  Tachycardia present.   Pulmonary/Chest: Effort normal and breath sounds normal. No respiratory distress. She has no wheezes. She has no rales.  Abdominal: Soft. Bowel sounds are normal. She exhibits no mass. There is no tenderness. There is no rebound and no guarding.  Musculoskeletal: Normal range of motion. She exhibits no edema.  Lymphadenopathy:    She has no cervical adenopathy.    She has no axillary adenopathy.  No head or neck adenopathy  Neurological: She is alert.  Speech is clear and goal oriented Moves extremities without ataxia  Skin: Skin is warm and dry. She is not diaphoretic.  Psychiatric: She has a normal mood and affect.    ED Course  Procedures (including critical care time)  DIAGNOSTIC STUDIES: Oxygen Saturation is 100% on room air, normal by my interpretation.    COORDINATION OF CARE: 10:32 PM-Explained that symptoms are most likely due to sinus infection.  Discussed treatment plan which includes slit lamp exam to rule out eye injury with pt at bedside and pt agreed to plan.    SLIT LAMP EXAM: Tetracaine 2 drops used.  Lids everted and swept for exam, no evidence of foreign body.  Conjunctivae: Mild injection   Cornea: No evidence of abrasion.  EOM: Intact  Pupils: PERRL  Fluorescein uptake: None Patient tolerated procedure well without immediate complications.  10:55 PM: Informed pt that exam ruled out abrasions.  Discussed treatment plan including antibiotics for sinus infection and eye drops for eye infection with pt at bedside and she agrees to plan.    Labs Reviewed - No data to display No results  found. 1. Viral conjunctivitis   2. Acute sinus infection     MDM  [place] Rocker presentation consistent with viral conjunctivitis.  No purulent discharge, corneal abrasions, entrapment, consensual photophobia, or dendritic staining with fluorescein study.  Presentation non-concerning for iritis, bacterial conjunctivitis, corneal abrasions, or HSV.  No antibiotics are indicated and patient will be prescribed naphazoline for itching.  Personal hygiene and frequent handwashing discussed.  Patient advised to followup with ophthalmologist if symptoms persist or worsen in any way including vision change or purulent discharge.  Patient complaining of symptoms of sinusitis.  Severe symptoms have been present in only one sinus with maxillary sinus pain and dizziness with position changes.  Concern for acute bacterial rhinosinusitis.  Patient discharged with Augmentin.  Instructions given for warm saline nasal wash and recommendations for follow-up with primary care physician.  I have also discussed reasons to return immediately to the ER.  Patient expresses understanding and agrees with plan.  I personally performed the services described in this documentation, which was scribed in my presence. The recorded information has been reviewed and  is accurate.    Dahlia Client Jeanean Hollett, PA-C 09/30/12 [id number]

## 2012-09-30 NOTE — ED Notes (Signed)
PT reported a 1 week HX of red eyes and blurred vision in Lt eye. Pt also reports pressure under Lt eye .

## 2012-10-04 NOTE — ED Provider Notes (Signed)
Medical screening examination/treatment/procedure(s) were performed by non-physician practitioner and as supervising physician I was immediately available for consultation/collaboration.    Christopher J. Pollina, MD 10/04/12 1537 

## 2013-06-20 ENCOUNTER — Other Ambulatory Visit (HOSPITAL_COMMUNITY)
Admission: RE | Admit: 2013-06-20 | Discharge: 2013-06-20 | Disposition: A | Discharge status: Home / Self Care | Payer: BC Managed Care – PPO | Source: Ambulatory Visit | Attending: Gynecology | Admitting: Gynecology

## 2013-06-20 ENCOUNTER — Encounter: Payer: Self-pay | Admitting: Gynecology

## 2013-06-20 ENCOUNTER — Ambulatory Visit (INDEPENDENT_AMBULATORY_CARE_PROVIDER_SITE_OTHER): Payer: BC Managed Care – PPO | Admitting: Gynecology

## 2013-06-20 VITALS — BP 118/82

## 2013-06-20 DIAGNOSIS — Z113 Encounter for screening for infections with a predominantly sexual mode of transmission: Secondary | ICD-10-CM

## 2013-06-20 DIAGNOSIS — N898 Other specified noninflammatory disorders of vagina: Secondary | ICD-10-CM

## 2013-06-20 DIAGNOSIS — Z01419 Encounter for gynecological examination (general) (routine) without abnormal findings: Secondary | ICD-10-CM

## 2013-06-20 DIAGNOSIS — Z1151 Encounter for screening for human papillomavirus (HPV): Secondary | ICD-10-CM | POA: Insufficient documentation

## 2013-06-20 LAB — WET PREP FOR TRICH, YEAST, CLUE
CLUE CELLS WET PREP: NONE SEEN
TRICH WET PREP: NONE SEEN
YEAST WET PREP: NONE SEEN

## 2013-06-20 LAB — CBC WITH DIFFERENTIAL/PLATELET
BASOS ABS: 0 10*3/uL (ref 0.0–0.1)
BASOS PCT: 0 % (ref 0–1)
EOS ABS: 0.1 10*3/uL (ref 0.0–0.7)
EOS PCT: 1 % (ref 0–5)
HEMATOCRIT: 39.4 % (ref 36.0–46.0)
Hemoglobin: 13.5 g/dL (ref 12.0–15.0)
LYMPHS PCT: 29 % (ref 12–46)
Lymphs Abs: 1.9 10*3/uL (ref 0.7–4.0)
MCH: 30.3 pg (ref 26.0–34.0)
MCHC: 34.3 g/dL (ref 30.0–36.0)
MCV: 88.5 fL (ref 78.0–100.0)
MONO ABS: 0.3 10*3/uL (ref 0.1–1.0)
Monocytes Relative: 5 % (ref 3–12)
Neutro Abs: 4.3 10*3/uL (ref 1.7–7.7)
Neutrophils Relative %: 65 % (ref 43–77)
PLATELETS: 244 10*3/uL (ref 150–400)
RBC: 4.45 MIL/uL (ref 3.87–5.11)
RDW: 13.4 % (ref 11.5–15.5)
WBC: 6.6 10*3/uL (ref 4.0–10.5)

## 2013-06-20 NOTE — Patient Instructions (Addendum)
Levonorgestrel intrauterine device (IUD) Qu es este medicamento? El LEVONORGESTREL (DIU) es un dispositivo anticonceptivo (control de natalidad). El dispositivo se coloca dentro del tero por un profesional de la salud. Se utiliza para Therapist, occupational y tambin se puede Risk manager para tratar el sangrado abundante que ocurre durante su perodo. Dependiendo del dispositivo, se puede utilizar por 3 a 5 aos. Este medicamento puede ser utilizado para otros usos; si tiene alguna pregunta consulte con su proveedor de atencin mdica o con su farmacutico. MARCAS COMERCIALES DISPONIBLES: Kennieth Rad le debo informar a mi profesional de la salud antes de tomar este medicamento? Necesita saber si usted presenta alguno de los siguientes problemas o situaciones: -exmen de Papanicolaou anormal -cncer de mama, cuello del tero o tero -diabetes -endometritis -si tiene una infeccin plvica o genital actual o en el pasado -tiene ms de una pareja sexual o si su pareja tiene ms de una pareja -enfermedad cardiaca -antecedente de embarazo tubrico o ectpico -problemas del sistema inmunolgico -DIU colocado -enfermedad heptica o tumor del hgado -problemas con la coagulacin o si toma diluyentes sanguneos -[place] medicamentos intravenoso -forma inusual del tero -sangrado vaginal que no tiene explicacin -una reaccin alrgica o inusual al levonorgestrel, a otras hormonas, a la silicona o polietilenos, a otros medicamentos, alimentos, colorantes o conservantes -si est embarazada o buscando quedar embarazada -si est amamantando a un beb Cmo debo utilizar este medicamento? Un profesional de Estate agent este dispositivo en el tero. Hable con su pediatra para informarse acerca del uso de este medicamento en nios. Puede requerir atencin especial. Sobredosis: Pngase en contacto inmediatamente con un centro toxicolgico o una sala de urgencia si usted cree que haya tomado demasiado  medicamento. ATENCIN: [employer] es solo para usted. No comparta este medicamento con nadie. Qu sucede si me olvido de una dosis? No se aplica en este caso. Qu puede interactuar con este medicamento? No tome esta medicina con ninguno de los siguientes medicamentos: -amprenavir -bosentano -fosamprenavir Esta medicina tambin puede interactuar con los siguientes medicamentos: -aprepitant -barbitricos para producir el sueo o para el tratamiento de convulsiones -bexaroteno -griseofulvina -medicamentos para tratar los convulsiones, tales como [place], [place], [place], [place], [place], topiramato -modafinilo -pioglitazona -rifabutina -rifampicina -rifapentina -algunos medicamentos para tratar el virus VIH, tales como atazanavir, indinavir, lopinavir, nelfinavir, tipranavir, ritonavir -hierba de [place] -warfarina Puede ser que esta lista no menciona todas las posibles interacciones. Informe a su profesional de [employer]	[place] de [employer] productos a base de hierbas, medicamentos de [place] o suplementos nutritivos que est tomando. Si usted fuma, consume bebidas alcohlicas o si utiliza drogas ilegales, indqueselo tambin a su profesional de [employer]	[place]. Algunas sustancias pueden interactuar con su medicamento. A qu debo estar atento al usar [employer]? Visite a su mdico o a su profesional de la salud para chequear su evolucin peridicamente. Visite a su mdico si usted o su pareja tiene relaciones sexuales con [employer], se vuelve VIH positivo o contrae una enfermedad de transmisin sexual. Este medicamento no la protege de la infeccin por VIH (SIDA) ni de ninguna otra enfermedad de transmisin sexual. Puede controlar la ubicacin del DIU usted misma palpando con sus dedos limpios los hilos en la parte anterior de la vagina. No tire de los hilos. Es un buen hbito controlar la ubicacin del dispositivo despus de cada perodo menstrual. Si no slo  siente los hilos sino que adems siente otra parte ms del DIU o si no puede sentir los hilos, consulte a su mdico  inmediatamente. El DIU puede salirse por s solo. Puede quedar embarazada si el dispositivo se sale de Chief of Staff. Utilice un mtodo anticonceptivo adicional, como preservativos, y consulte a su proveedor de atencin mdica s observa que el DIU se sali de Chief of Staff. La utilizacin de tampones no cambia la posicin del DIU y no hay inconvenientes en usarlos durante su perodo. Qu efectos secundarios puedo tener al [employer] este medicamento? Efectos secundarios que debe informar a su mdico o a Barrister's clerk de la salud tan pronto como sea posible: -Chief of Staff como erupcin cutnea, picazn o urticarias, hinchazn de la cara, labios o lengua -fiebre, sntomas gripales -llagas genitales -alta presin sangunea -ausencia de un perodo menstrual durante 6 semanas mientras lo utiliza -Social research officer, government, Occupational hygienist en las piernas -dolor o sensibilidad del plvico -dolor de cabeza repentino o severo -signos de Media planner -calambres estomacales -falta de aliento repentina -problemas de coordinacin, del habla, al caminar -sangrado, flujo vaginal inusual -color amarillento de los ojos o la piel Efectos secundarios que, por lo general, no requieren atencin mdica (debe informarlos a su mdico o a su profesional de la salud si persisten o si son molestos): -acn -dolor de pecho -cambios en el deseo sexual o capacidad -cambios de peso -calambres, Tree surgeon o sensacin de [employer] se introduce el dispositivo -dolor de cabeza -sangrado menstruales irregulares en los primeros 3 a 6 meses de usar -nuseas Puede ser que esta lista no menciona todos los posibles efectos secundarios. Comunquese a su mdico por asesoramiento mdico [employer]. Usted puede informar los efectos secundarios a la FDA por telfono al 1-800-FDA-1088. Dnde debo guardar mi medicina? No  se aplica en este caso. ATENCIN: Este folleto es un resumen. Puede ser que no cubra toda la posible informacin. Si usted tiene preguntas acerca de esta medicina, consulte con su mdico, su farmacutico o su profesional de Technical sales engineer.  2014, Elsevier/Gold Standard. (2011-05-13 16:57:41) BRCA-1 y BRCA-2 (BRCA-1 and BRCA-2) Los genes BRCA-1 y BRCA-2 estn vinculados al cncer de mama y al cncer de ovario hereditarios. Cada ao, a aproximadamente 200000 mujeres se les diagnostica cncer de mama invasivo, y a alrededor de 23000, se les diagnostica cncer de ovario (segn la American Cancer Society [Sociedad Estadounidense del Cncer]). De estos tipos de cncer, entre el 5% y el 10% se deben a una mutacin en uno de los genes BRCA. Los hombres tambin pueden heredar un riesgo mayor de Horticulturist, commercial de mama, principalmente debido a una alteracin en el gen BRCA-2.  Las Engineer, manufacturing con [employer] en el gen BRCA-1 o BRCA-2 tienen un riesgo significativamente mayor de Best boy cncer de mama (riesgo de por vida de hasta el 80%), de ovario (riesgo de por vida de hasta el 40%), de mama bilateral y otros tipos de cncer. Las [employer] BRCA se heredan y traspasan de generacin a generacin. [place], se heredan del lado paterno.  Se [place] el ADN de los glbulos blancos para detectar las mutaciones en los genes BRCA. Si bien los productos gnicos (protenas) de los genes BRCA actan solo en los tejidos de la mama y de los ovarios, los genes se presentan en cada clula del organismo y Physiological scientist la sangre es la forma ms sencilla de acceder a Patent examiner. PREPARACIN PARA EL ESTUDIO El anlisis para Chief Operating Officer en los genes BRCA se hace con Truddie Coco de sangre obtenida con Ardelia Mems aguja en una vena del brazo. El anlisis no requiere [place] biopsia [place] del tejido [employer]  u ovrico.  HALLAZGOS NORMALES Ningn signo de mutaciones genticas. Los rangos para los resultados normales pueden variar  entre diferentes laboratorios y hospitales. Consulte siempre con su mdico despus de Psychologist, counselling estudio para Armed forces logistics/support/administrative officer significado de los [place] y si los valores se consideran "dentro de los lmites normales". SIGNIFICADO DEL ESTUDIO  El mdico leer los resultados y Electrical engineer con usted sobre la importancia y el significado de los [place], as como las opciones de tratamiento y la necesidad de Optometrist pruebas adicionales, si fuera necesario. OBTENCIN DE LOS RESULTADOS DE LAS PRUEBAS Es su responsabilidad retirar el resultado del [place]. Consulte en el laboratorio cundo y cmo podr [employer]. OTRAS COSAS A TENER EN [place] Los resultados de su anlisis pueden tener repercusiones para otros familiares. Cuando un familiar se Network engineer para Chief Operating Officer en el BRCA, suelen surgir dudas sobre si compartir o no esta informacin con el resto de la familia. Asesrese con un consejero gentico sobre cmo Careers information officer resultado a sus familiares.  No est de ms insistir en que consulte a un mdico informado sobre pruebas genticas antes y despus del anlisis.  Deben considerarse muchos puntos durante la preparacin para un [employer] gentico y al [employer], y un consejero gentico tiene la experiencia y los conocimientos que lo ayudarn a Licensed conveyancer.  Si el anlisis de BRCA confirma la mutacin, las opciones son aumentar la frecuencia de los controles (por [place], [place], anlisis de sangre de CA-125 o ecografa transvaginal); recibir medicamentos para reducir Catering manager (por ejemplo, anticonceptivos orales o tamoxifeno); o someterse a una ciruga para extraer las mamas o los ovarios. Existen muchas variables involucradas y es Designer, jewellery sus opciones con su mdico y su consejero gentico. Segn los informes de los estudios de investigacin, cada 1016mjeres sin mutaciones en los genes BRCA, entre 12 y 4Firefighterde mama antes de [place] y  [place]3 y 4Engineer, petroleumde ovario antes de esa edad. El riesgo aumenta con la edad. El anlisis puede indicarlo un mdico, preferentemente uno capaz de ofrecer asesoramiento gentico. LCornelia Copade sSpring Gapse enviar a un laboratorio especializado en anlisis de BRCA. LCorporate investment bankerof Clinical Oncology (Sociedad Estadounidense de Oncologa C[place] y [place](CW[place]olt aPsychologist, forensica las mujeres que se realizan el anlisis para que participen en estudios de rCharlotte Parka largo plazo para ayudar a oTherapist, musicinformacin sobre la efectividad de los distintos controles y las opciones de tClinical research associate Document Released: 01/12/2013 E[facility] Care CenterPatient Information 2014 EPlessis LMaine

## 2013-06-21 ENCOUNTER — Telehealth: Payer: Self-pay | Admitting: *Deleted

## 2013-06-21 LAB — URINALYSIS W MICROSCOPIC + REFLEX CULTURE
BILIRUBIN URINE: NEGATIVE
Casts: NONE SEEN
Crystals: NONE SEEN
GLUCOSE, UA: NEGATIVE mg/dL
HGB URINE DIPSTICK: NEGATIVE
Ketones, ur: NEGATIVE mg/dL
LEUKOCYTES UA: NEGATIVE
Nitrite: NEGATIVE
PROTEIN: NEGATIVE mg/dL
Specific Gravity, Urine: <1.005 — ABNORMAL LOW (ref 1.005–1.030)
Squamous Epithelial / LPF: NONE SEEN
UROBILINOGEN UA: 0.2 mg/dL (ref 0.0–1.0)
pH: 6 (ref 5.0–8.0)

## 2013-06-21 LAB — COMPREHENSIVE METABOLIC PANEL
ALT: 18 U/L (ref 0–35)
AST: 13 U/L (ref 0–37)
Albumin: 4.6 g/dL (ref 3.5–5.2)
Alkaline Phosphatase: 43 U/L (ref 39–117)
BILIRUBIN TOTAL: 0.7 mg/dL (ref 0.2–1.2)
BUN: 6 mg/dL (ref 6–23)
CALCIUM: 9.4 mg/dL (ref 8.4–10.5)
CHLORIDE: 103 meq/L (ref 96–112)
CO2: 23 mEq/L (ref 19–32)
CREATININE: 0.55 mg/dL (ref 0.50–1.10)
Glucose, Bld: 94 mg/dL (ref 70–99)
Potassium: 3.5 mEq/L (ref 3.5–5.3)
Sodium: 136 mEq/L (ref 135–145)
Total Protein: 7.2 g/dL (ref 6.0–8.3)

## 2013-06-21 LAB — CHOLESTEROL, TOTAL: Cholesterol: 166 mg/dL (ref 0–200)

## 2013-06-21 LAB — HEPATITIS B SURFACE ANTIGEN: Hepatitis B Surface Ag: NEGATIVE

## 2013-06-21 LAB — RPR

## 2013-06-21 LAB — HIV ANTIBODY (ROUTINE TESTING W REFLEX): HIV: NONREACTIVE

## 2013-06-21 LAB — GC/CHLAMYDIA PROBE AMP
CT Probe RNA: NEGATIVE
GC Probe RNA: NEGATIVE

## 2013-06-21 LAB — TSH: TSH: 2.724 u[IU]/mL (ref 0.350–4.500)

## 2013-06-21 LAB — HEPATITIS C ANTIBODY: HCV AB: NEGATIVE

## 2013-06-21 NOTE — Telephone Encounter (Signed)
Message copied by Thamas Jaegers on Tue Jun 21, 2013  2:40 PM ------      Message from: Terrance Mass      Created: Mon Jun 20, 2013  4:24 PM       Please schedule appointment at [facility]. Patient with strong family history of breast cancer for BrCa testing ------

## 2013-06-21 NOTE — Progress Notes (Addendum)
[name] 1980-11-22 [id number]   History:    33 y.o.  for annual gyn exam who is a new patient to the practice. Patient was complaining of a questionable yeast infection and was wondering if she needed to have her IUD removed. She had a Mirena IUD placed in June of 2010. Patient was concern whether she may have a yeast infection. The patient wanted to have STD screening.  Patient with strong family history of breast cancer as follows: Mother diagnosed with breast cancer at the age of 67 recently deceased 5 diagnosed with breast cancer at the age of 45 1 of 3 sisters in process of going through genetic counseling for BRCA1 and BRCA2 gene mutation testing.  Patient by over-the-counter Mycolog and took for 7 days and wanted to see her for yeast infection had cleared.  Past medical history,surgical history, family history and social history were all reviewed and documented in the EPIC chart.  Gynecologic History No LMP recorded. Patient is not currently having periods (Reason: IUD). Contraception: IUD Last Pap: A urine half ago. Results were: [place] report to be normal by patient Last mammogram: Not indicated. Results were: Not indicated  Obstetric History OB History  Gravida Para Term Preterm AB SAB TAB Ectopic Multiple Living  _0 # Outcome Date GA Lbr Len/2nd Weight Sex Delivery Anes PTL Lv  1 PAR                ROS: A ROS was performed and pertinent positives and negatives are included in the history.  GENERAL: No fevers or chills. HEENT: No change in vision, no earache, sore throat or sinus congestion. NECK: No pain or stiffness. CARDIOVASCULAR: No chest pain or pressure. No palpitations. PULMONARY: No shortness of breath, cough or wheeze. GASTROINTESTINAL: No abdominal pain, nausea, vomiting or diarrhea, melena or bright red blood per rectum. GENITOURINARY: No urinary frequency, urgency, hesitancy or dysuria. MUSCULOSKELETAL: No joint  or muscle pain, no back pain, no recent trauma. DERMATOLOGIC: No rash, no itching, no lesions. ENDOCRINE: No polyuria, polydipsia, no heat or cold intolerance. No recent change in weight. HEMATOLOGICAL: No anemia or easy bruising or bleeding. NEUROLOGIC: No headache, seizures, numbness, tingling or weakness. PSYCHIATRIC: No depression, no loss of interest in normal activity or change in sleep pattern.     Exam: chaperone present  BP 118/82  There is no weight on file to calculate BMI.  General appearance : Well developed well nourished female. No acute distress HEENT: Neck supple, trachea midline, no carotid bruits, no thyroidmegaly Lungs: Clear to auscultation, no rhonchi or wheezes, or rib retractions  Heart: Regular rate and rhythm, no murmurs or gallops Breast:Examined in sitting and supine position were symmetrical in appearance, no palpable masses or tenderness,  no skin retraction, no nipple inversion, no nipple discharge, no skin discoloration, no axillary or supraclavicular lymphadenopathy Abdomen: no palpable masses or tenderness, no rebound or guarding Extremities: no edema or skin discoloration or tenderness  Pelvic:  Bartholin, Urethra, Skene Glands: Within normal limits             Vagina: No gross lesions or discharge  Cervix: No gross lesions or discharge, IUD string seen  Uterus  anteverted, normal size, shape and consistency, non-tender and mobile  Adnexa  Without masses or tenderness  Anus and perineum  normal   Rectovaginal  normal sphincter tone without palpated masses or tenderness  Hemoccult not indicated     Assessment/Plan:  33 y.o. female for annual exam who will be referred to the [facility] for genetic counseling for BRCA1 and BRCA2 testing. Her wet prep today was negative. The following labs were were: CBC, fasting lipid profile, comprehensive metabolic panel, TSH, urinalysis, HIV, RPR, hepatitis B and C. as well as GC and  chlamydia culture. Pap smear was done today and then will begin to follow the new guide lines.. Patient was reminded to do her monthly breast exams. She will return back next month to remove her IUD and reinsert with a new one.  Note: This dictation was prepared with  Dragon/digital dictation along withSmart phrase technology. Any transcriptional errors that result from this process are unintentional.   Terrance Mass MD, 9:58 AM 06/21/2013

## 2013-06-21 NOTE — Telephone Encounter (Signed)
[facility] they will contact to schedule.

## 2013-06-22 ENCOUNTER — Telehealth: Payer: Self-pay | Admitting: Physician Assistant

## 2013-06-22 ENCOUNTER — Encounter: Payer: Self-pay | Admitting: Physician Assistant

## 2013-06-22 ENCOUNTER — Ambulatory Visit (INDEPENDENT_AMBULATORY_CARE_PROVIDER_SITE_OTHER): Payer: BC Managed Care – PPO | Admitting: Physician Assistant

## 2013-06-22 ENCOUNTER — Ambulatory Visit (HOSPITAL_BASED_OUTPATIENT_CLINIC_OR_DEPARTMENT_OTHER)
Admission: RE | Admit: 2013-06-22 | Discharge: 2013-06-22 | Disposition: A | Discharge status: Home / Self Care | Payer: BC Managed Care – PPO | Source: Ambulatory Visit | Attending: Physician Assistant | Admitting: Physician Assistant

## 2013-06-22 VITALS — BP 108/68 | HR 80 | Temp 98.4°F | Resp 16 | Ht 61.0 in | Wt 143.2 lb

## 2013-06-22 DIAGNOSIS — R109 Unspecified abdominal pain: Secondary | ICD-10-CM

## 2013-06-22 DIAGNOSIS — K219 Gastro-esophageal reflux disease without esophagitis: Secondary | ICD-10-CM

## 2013-06-22 DIAGNOSIS — R1084 Generalized abdominal pain: Secondary | ICD-10-CM | POA: Insufficient documentation

## 2013-06-22 DIAGNOSIS — I10 Essential (primary) hypertension: Secondary | ICD-10-CM

## 2013-06-22 DIAGNOSIS — Z0000 Encounter for general adult medical examination without abnormal findings: Secondary | ICD-10-CM

## 2013-06-22 DIAGNOSIS — E119 Type 2 diabetes mellitus without complications: Secondary | ICD-10-CM

## 2013-06-22 LAB — BASIC METABOLIC PANEL
BUN: 6 mg/dL (ref 6–23)
CO2: 26 meq/L (ref 19–32)
CREATININE: 0.57 mg/dL (ref 0.50–1.10)
Calcium: 9.5 mg/dL (ref 8.4–10.5)
Chloride: 102 mEq/L (ref 96–112)
Glucose, Bld: 94 mg/dL (ref 70–99)
POTASSIUM: 3.8 meq/L (ref 3.5–5.3)
Sodium: 136 mEq/L (ref 135–145)

## 2013-06-22 LAB — HEPATIC FUNCTION PANEL
ALK PHOS: 51 U/L (ref 39–117)
ALT: 18 U/L (ref 0–35)
AST: 16 U/L (ref 0–37)
Albumin: 4.7 g/dL (ref 3.5–5.2)
BILIRUBIN DIRECT: 0.2 mg/dL (ref 0.0–0.3)
BILIRUBIN INDIRECT: 0.7 mg/dL (ref 0.2–1.2)
Total Bilirubin: 0.9 mg/dL (ref 0.2–1.2)
Total Protein: 7.4 g/dL (ref 6.0–8.3)

## 2013-06-22 LAB — CBC WITH DIFFERENTIAL/PLATELET
BASOS ABS: 0 10*3/uL (ref 0.0–0.1)
Basophils Relative: 0 % (ref 0–1)
EOS PCT: 2 % (ref 0–5)
Eosinophils Absolute: 0.1 10*3/uL (ref 0.0–0.7)
HCT: 39.6 % (ref 36.0–46.0)
Hemoglobin: 13.5 g/dL (ref 12.0–15.0)
LYMPHS ABS: 1.5 10*3/uL (ref 0.7–4.0)
LYMPHS PCT: 30 % (ref 12–46)
MCH: 30.1 pg (ref 26.0–34.0)
MCHC: 34.1 g/dL (ref 30.0–36.0)
MCV: 88.4 fL (ref 78.0–100.0)
Monocytes Absolute: 0.4 10*3/uL (ref 0.1–1.0)
Monocytes Relative: 8 % (ref 3–12)
NEUTROS ABS: 2.9 10*3/uL (ref 1.7–7.7)
NEUTROS PCT: 60 % (ref 43–77)
Platelets: 249 10*3/uL (ref 150–400)
RBC: 4.48 MIL/uL (ref 3.87–5.11)
RDW: 13.5 % (ref 11.5–15.5)
WBC: 4.9 10*3/uL (ref 4.0–10.5)

## 2013-06-22 LAB — LIPID PANEL
CHOL/HDL RATIO: 3.7 ratio
CHOLESTEROL: 159 mg/dL (ref 0–200)
HDL: 43 mg/dL (ref >39)
LDL Cholesterol: 98 mg/dL (ref 0–99)
Triglycerides: 88 mg/dL (ref <150)
VLDL: 18 mg/dL (ref 0–40)

## 2013-06-22 LAB — URINE CULTURE

## 2013-06-22 LAB — TSH: TSH: 3.824 u[IU]/mL (ref 0.350–4.500)

## 2013-06-22 IMAGING — US US ABDOMEN COMPLETE
1 series · 14 of 25 positions shown · non-contrast
Comparison: None.

CLINICAL DATA: General abdominal pain

EXAM:
ULTRASOUND ABDOMEN COMPLETE

[Series 1: us abdomen complete · 0.32mm/px · 14 of 72 slices shown]
[im 1/72]
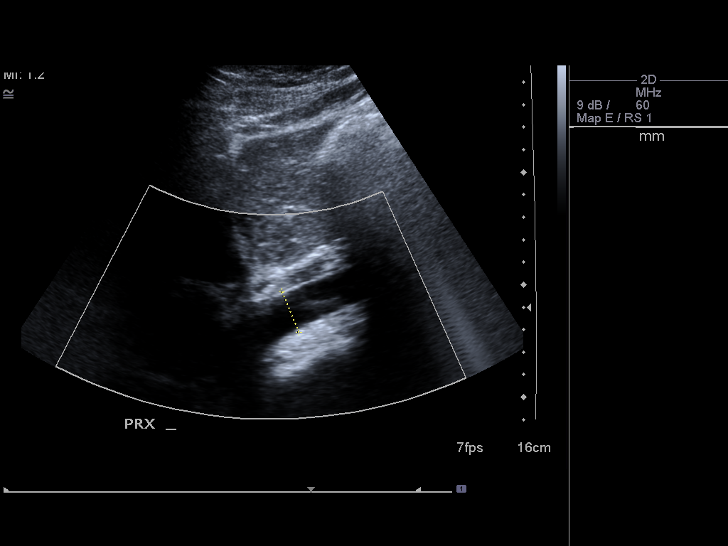
[im 6/72]
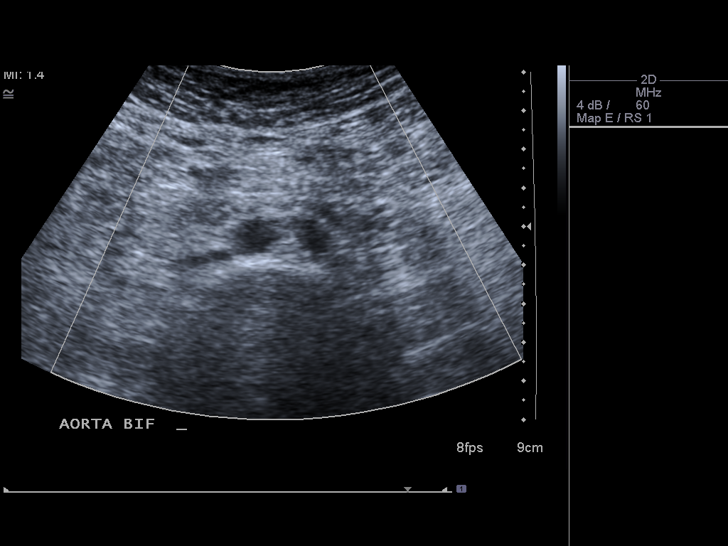
[im 12/72]
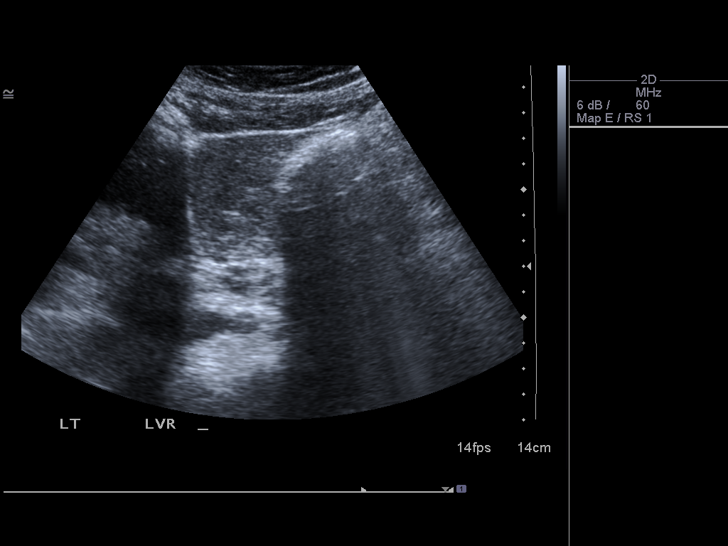
[im 18/72]
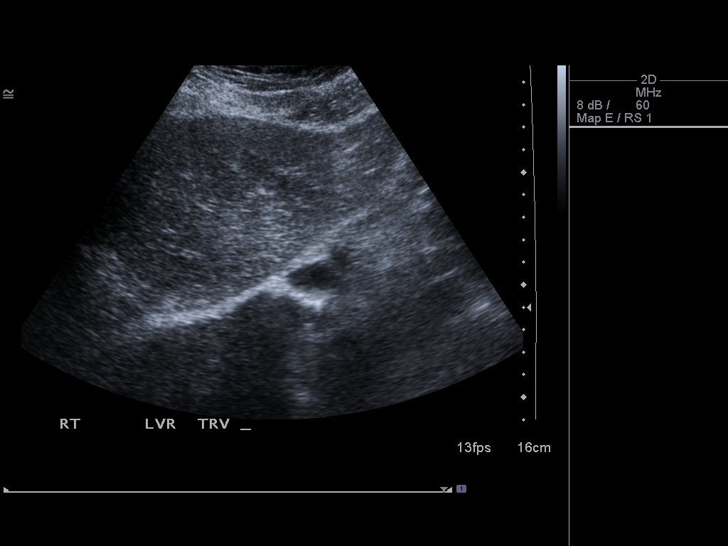
[im 24/72]
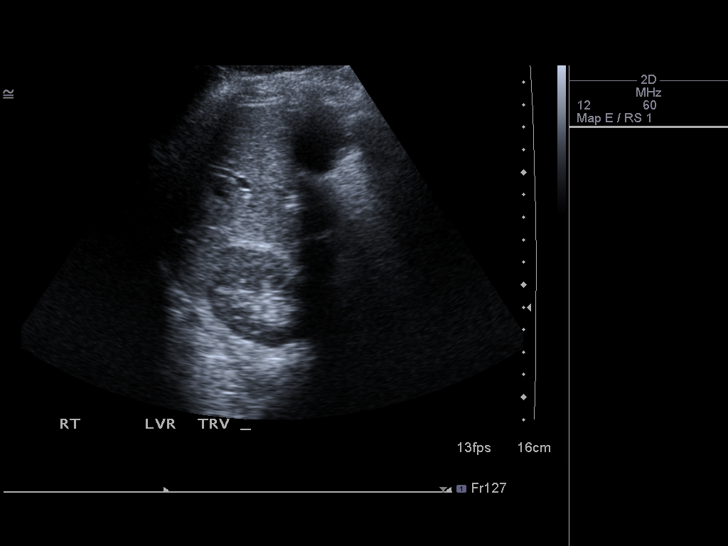
[im 27/72]
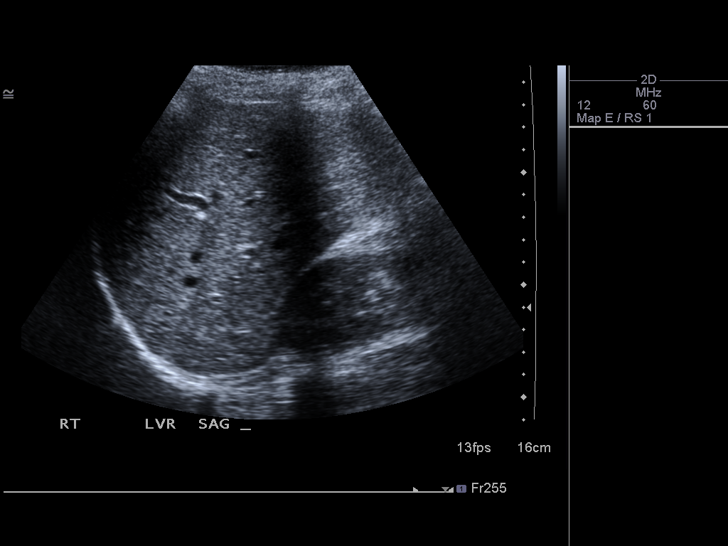
[im 33/72]
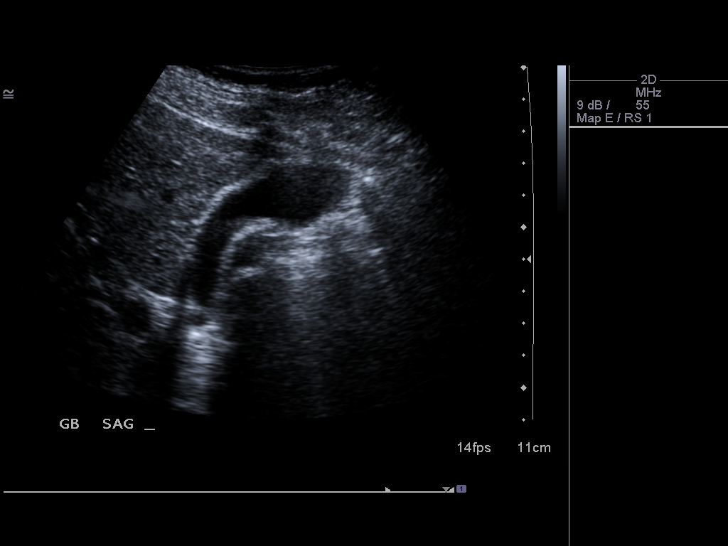
[im 39/72]
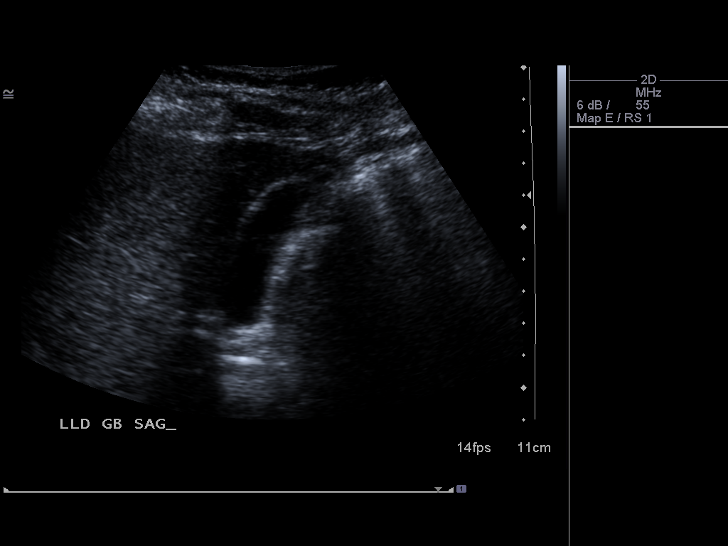
[im 45/72]
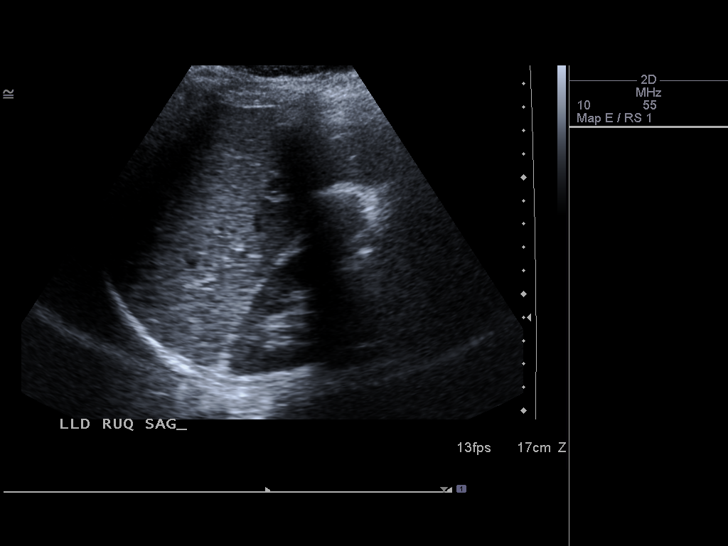
[im 48/72]
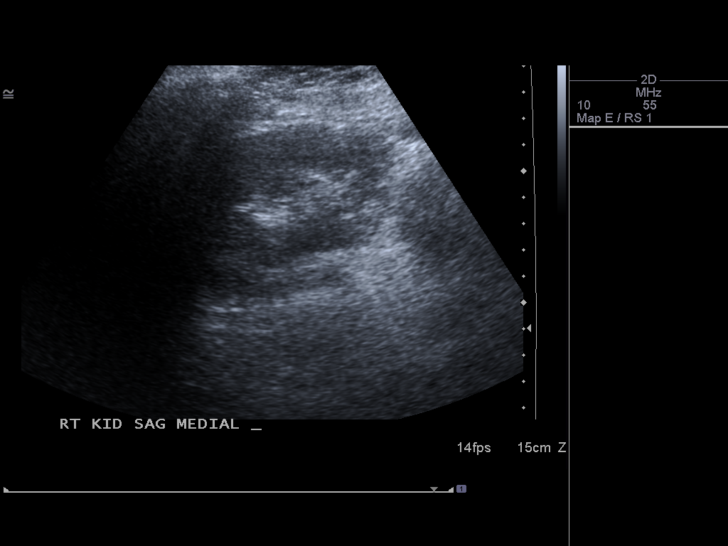
[im 54/72]
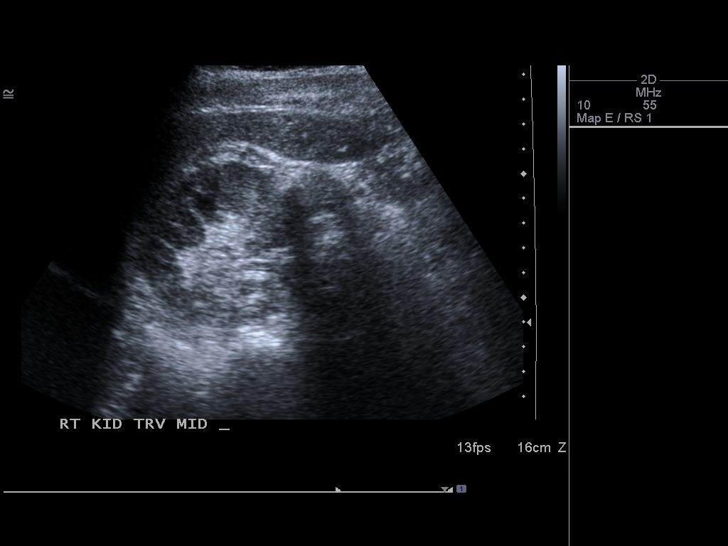
[im 60/72]
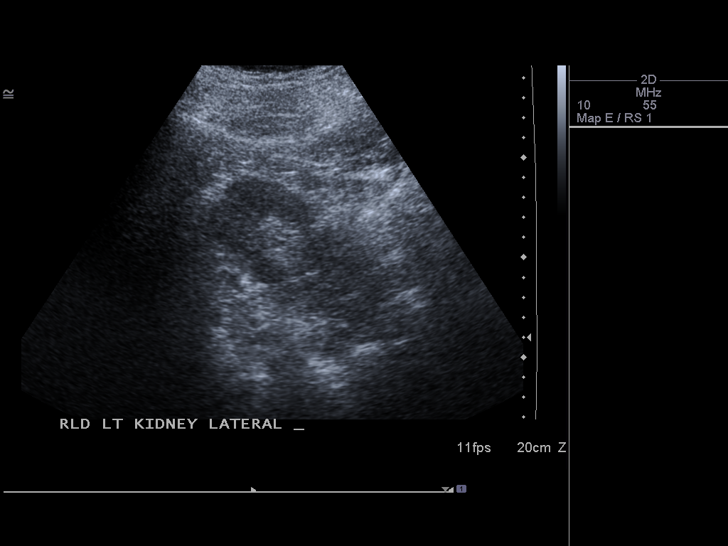
[im 66/72]
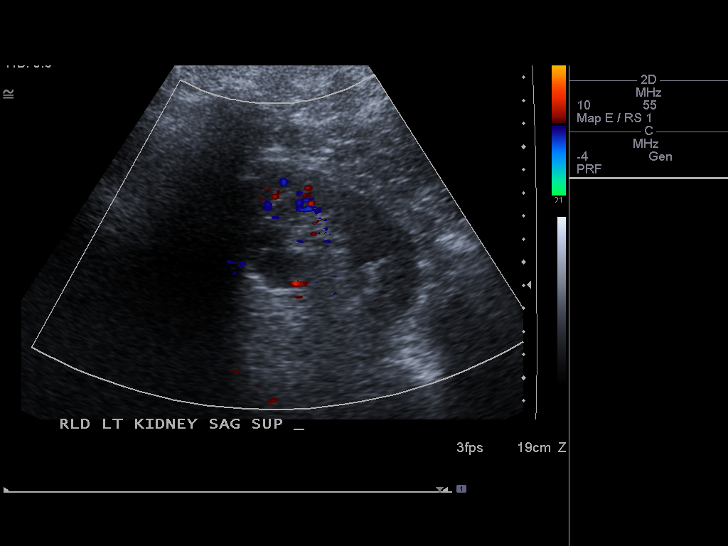
[im 72/72]
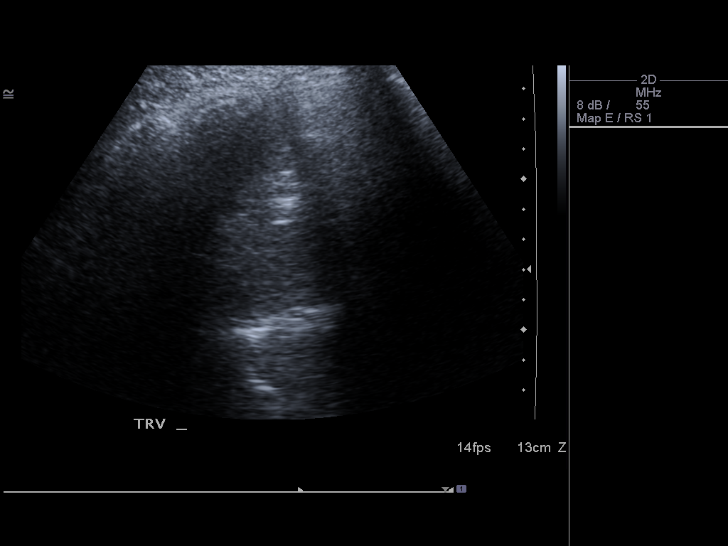

[14 of 25 positions shown; findings below may reference images not displayed]

FINDINGS: Gallbladder:

No gallstones or wall thickening visualized. No sonographic Murphy
sign noted.

Common bile duct:

Diameter: 2 mm

Liver:

No focal lesions identified.  There is a heterogeneous echotexture.

IVC:

No abnormality visualized.

Pancreas:

The pancreas is suboptimally visualized secondary to overlying bowel
gas.

Spleen:

Spleen is limited in evaluation secondary to overlying bowel gas.

Right Kidney:

Length: 10.6 cm. Echogenicity within normal limits. No mass or
hydronephrosis visualized.

Left Kidney:

Length: 10.7 cm. There is a 1.1 x 1.8 x 2.6 cm hypoechoic left upper
pole renal mass which is difficult to visualize secondary to bowel
gas.. Echogenicity within normal limits. No hydronephrosis
visualized.

Abdominal aorta:

No aneurysm visualized.

Other findings:

None.
IMPRESSION: 1. No cholelithiasis or sonographic evidence of acute cholecystitis.
2. There is a indeterminate 1.1 x 1.8 x 2.6 cm hypoechoic left upper
pole renal mass. Recommend further evaluation with a multiphasic CT
or MRI for better characterization.

## 2013-06-22 MED ORDER — OMEPRAZOLE 2 MG/ML ORAL SUSPENSION: 20.0000 mg | Freq: Every day | ORAL | Status: DC

## 2013-06-22 NOTE — Telephone Encounter (Signed)
Relevant patient education assigned to patient using Emmi. ° °

## 2013-06-22 NOTE — Progress Notes (Signed)
Pre visit review using our clinic review tool, if applicable. No additional management support is needed unless otherwise documented below in the visit note/SLS  

## 2013-06-22 NOTE — Assessment & Plan Note (Signed)
History reviewed.  Health Maintenance UTD.  Will obtains fasting labs.

## 2013-06-22 NOTE — Addendum Note (Signed)
Addended by: Regis BillSCATES, Maty Zeisler L on: 06/22/2013 03:07 PM   Modules accepted: Orders

## 2013-06-22 NOTE — Progress Notes (Signed)
Patient presents to clinic today to establish care.  Acute Concerns: GERD -- Controlled in the past with Nexium.  Has been without medication for year due to lack of insurance.  Endorses occasional epigastric pain.  Denies melena, hematochezia.  Denies excess NSAID use.  Unsure of H. Pylori history.  Chronic Issues: + Family history of Breast Cancer -- patient sees OB/GYN.  Has pending BRCA genetic testing.  Has had normal breat ultrasound and examinations.  Health Maintenance: Dental -- overdue Vision -- overdue Immunizations -- Declines flu shot. Endorses tetanus 1 year ago. Breat Ultrasound - 2 years ago. PAP -- Followed by OB/GYN. Last PAP 2 days ago.  No history of abnormal pap smear.  History reviewed. No pertinent past medical history.  Past Surgical History  Procedure Laterality Date  . Cesarean section      No current outpatient prescriptions on file prior to visit.   No current facility-administered medications on file prior to visit.    No Known Allergies  Family History  Problem Relation Age of Onset  . Breast cancer Mother   . Hypertension Father   . Breast cancer Maternal Grandmother     History   Social History  . Marital Status: Married    Spouse Name: N/A    Number of Children: N/A  . Years of Education: N/A   Occupational History  . Not on file.   Social History Main Topics  . Smoking status: Light Tobacco Smoker  . Smokeless tobacco: Never Used  . Alcohol Use: Yes     Comment: socially  . Drug Use: No  . Sexual Activity: Yes     Comment: MIRENA   Other Topics Concern  . Not on file   Social History Narrative  . No narrative on file    Review of Systems  Constitutional: Negative for fever and weight loss.  HENT: Negative for ear discharge, ear pain, hearing loss and tinnitus.   Eyes: Negative for blurred vision, double vision, photophobia and pain.  Respiratory: Negative for cough and shortness of breath.   Cardiovascular: Negative  for chest pain and palpitations.  Gastrointestinal: Positive for heartburn, abdominal pain and melena. Negative for nausea, vomiting, diarrhea, constipation and blood in stool.  Genitourinary: Negative for dysuria, urgency, frequency, hematuria and flank pain.  Neurological: Negative for dizziness, loss of consciousness and headaches.  Psychiatric/Behavioral: Negative for depression, suicidal ideas, hallucinations and substance abuse. The patient is not nervous/anxious.     BP 108/68  Pulse 80  Temp(Src) 98.4 F (36.9 C) (Oral)  Resp 16  Ht 5' 1"  (1.549 m)  Wt 143 lb 4 oz (64.978 kg)  BMI 27.08 kg/m2  SpO2 97%  Physical Exam  Vitals reviewed. Constitutional: She is oriented to person, place, and time and well-developed, well-nourished, and in no distress.  HENT:  Head: Normocephalic and atraumatic.  Right Ear: External ear normal.  Left Ear: External ear normal.  Nose: Nose normal.  Mouth/Throat: Oropharynx is clear and moist. No oropharyngeal exudate.  TM within normal limits bilaterally.  Eyes: Conjunctivae are normal. Pupils are equal, round, and reactive to light.  Neck: Neck supple.  Cardiovascular: Normal rate, regular rhythm, normal heart sounds and intact distal pulses.   Pulmonary/Chest: Effort normal and breath sounds normal. No respiratory distress. She has no wheezes. She has no rales. She exhibits no tenderness.  Abdominal: Soft. Bowel sounds are normal. She exhibits no distension and no mass. There is no rebound and no guarding.  Mild diffuse tenderness noted.  Musculoskeletal: Normal range of motion.  Lymphadenopathy:    She has no cervical adenopathy.  Neurological: She is alert and oriented to person, place, and time.  Skin: Skin is warm and dry. No rash noted.  Psychiatric: Affect normal.    Recent Results (from the past 2160 hour(s))  URINALYSIS W MICROSCOPIC + REFLEX CULTURE     Status: Abnormal   Collection Time    06/20/13  4:34 PM      Result Value  Ref Range   Color, Urine YELLOW  YELLOW   APPearance CLEAR  CLEAR   Specific Gravity, Urine <1.005 (*) 1.005 - 1.030   pH 6.0  5.0 - 8.0   Glucose, UA NEG  NEG mg/dL   Bilirubin Urine NEG  NEG   Ketones, ur NEG  NEG mg/dL   Hgb urine dipstick NEG  NEG   Protein, ur NEG  NEG mg/dL   Urobilinogen, UA 0.2  0.0 - 1.0 mg/dL   Nitrite NEG  NEG   Leukocytes, UA NEG  NEG   Squamous Epithelial / LPF NONE SEEN  RARE   Crystals NONE SEEN  NONE SEEN   Casts NONE SEEN  NONE SEEN   WBC, UA 0-2  <3 WBC/hpf   RBC / HPF 0-2  <3 RBC/hpf   Bacteria, UA FEW (*) RARE  URINE CULTURE     Status: None   Collection Time    06/20/13  4:34 PM      Result Value Ref Range   Colony Count 20,OOO COLONIES/ML     Organism ID, Bacteria Multiple bacterial morphotypes present, none     Organism ID, Bacteria predominant. Suggest appropriate recollection if      Organism ID, Bacteria clinically indicated.    CBC WITH DIFFERENTIAL     Status: None   Collection Time    06/20/13  4:38 PM      Result Value Ref Range   WBC 6.6  4.0 - 10.5 K/uL   RBC 4.45  3.87 - 5.11 MIL/uL   Hemoglobin 13.5  12.0 - 15.0 g/dL   HCT 39.4  36.0 - 46.0 %   MCV 88.5  78.0 - 100.0 fL   MCH 30.3  26.0 - 34.0 pg   MCHC 34.3  30.0 - 36.0 g/dL   RDW 13.4  11.5 - 15.5 %   Platelets 244  150 - 400 K/uL   Neutrophils Relative % 65  43 - 77 %   Neutro Abs 4.3  1.7 - 7.7 K/uL   Lymphocytes Relative 29  12 - 46 %   Lymphs Abs 1.9  0.7 - 4.0 K/uL   Monocytes Relative 5  3 - 12 %   Monocytes Absolute 0.3  0.1 - 1.0 K/uL   Eosinophils Relative 1  0 - 5 %   Eosinophils Absolute 0.1  0.0 - 0.7 K/uL   Basophils Relative 0  0 - 1 %   Basophils Absolute 0.0  0.0 - 0.1 K/uL   Smear Review Criteria for review not met    COMPREHENSIVE METABOLIC PANEL     Status: None   Collection Time    06/20/13  4:38 PM      Result Value Ref Range   Sodium 136  135 - 145 mEq/L   Potassium 3.5  3.5 - 5.3 mEq/L   Chloride 103  96 - 112 mEq/L   CO2 23  19 - 32  mEq/L   Glucose, Bld 94  70 - 99 mg/dL  BUN 6  6 - 23 mg/dL   Creat 0.55  0.50 - 1.10 mg/dL   Total Bilirubin 0.7  0.2 - 1.2 mg/dL   Alkaline Phosphatase 43  39 - 117 U/L   AST 13  0 - 37 U/L   ALT 18  0 - 35 U/L   Total Protein 7.2  6.0 - 8.3 g/dL   Albumin 4.6  3.5 - 5.2 g/dL   Calcium 9.4  8.4 - 10.5 mg/dL  CHOLESTEROL, TOTAL     Status: None   Collection Time    06/20/13  4:38 PM      Result Value Ref Range   Cholesterol 166  0 - 200 mg/dL   Comment: ATP III Classification:           < 200        mg/dL        Desirable          200 - 239     mg/dL        Borderline High          >= 240        mg/dL        High        TSH     Status: None   Collection Time    06/20/13  4:38 PM      Result Value Ref Range   TSH 2.724  0.350 - 4.500 uIU/mL  RPR     Status: None   Collection Time    06/20/13  4:38 PM      Result Value Ref Range   RPR NON REAC  NON REAC  HIV ANTIBODY (ROUTINE TESTING)     Status: None   Collection Time    06/20/13  4:38 PM      Result Value Ref Range   HIV NON REACTIVE  NON REACTIVE   Comment:       Effective July 11, 2013, [employer] will no longer offer the     current 3rd Generation HIV diagnostic screening assay, HIV Antibodies,     HIV-1/2 EIA, with reflexes. At that time, [employer] will     only offer HIV-1/2 Ag/Ab, 4th Gen, w/ Reflexes as recommended by the     CDC. This HIV diagnostic screening assay tests for antibodies to HIV-1     and HIV-2 as well as HIV p24 antigen and provides greater sensitivity     for the detection of recent infection. Any orders for the 3rd     Generation assay will automatically be referred to the 4th Generation     assay.  HEPATITIS C ANTIBODY     Status: None   Collection Time    06/20/13  4:38 PM      Result Value Ref Range   HCV Ab NEGATIVE  NEGATIVE  HEPATITIS B SURFACE ANTIGEN     Status: None   Collection Time    06/20/13  4:38 PM      Result Value Ref Range   Hepatitis B Surface Ag  NEGATIVE  NEGATIVE  WET PREP FOR TRICH, YEAST, CLUE     Status: None   Collection Time    06/20/13  4:43 PM      Result Value Ref Range   Yeast Wet Prep HPF POC NONE SEEN  NONE SEEN   Trich, Wet Prep NONE SEEN  NONE SEEN   Clue Cells Wet Prep HPF POC NONE SEEN  NONE  SEEN   WBC, Wet Prep HPF POC FEW  NONE SEEN   Comment: FEW BACTERIA SEEN     (3-6) EPITH. CELLS PER HPF     AMINE NEGATIVE  GC/CHLAMYDIA PROBE AMP     Status: None   Collection Time    06/20/13  4:43 PM      Result Value Ref Range   CT Probe RNA NEGATIVE     GC Probe RNA NEGATIVE     Comment:                                                                                             **Normal Reference Range: Negative**                 Assay performed using the Gen-Probe APTIMA COMBO2 (R) Assay.           Acceptable specimen types for this assay include APTIMA Swabs (Unisex,     endocervical, urethral, or vaginal), first void urine, and ThinPrep     liquid based cytology samples.    Assessment/Plan: GERD (gastroesophageal reflux disease) Patient requesting liquid medication.  Rx Prilosec suspension daily.  Avoid late-night eating.  Elevate HOB.  Decrease alcohol. Will obtain H. Pylori testing due to occasional abdominal discomfort with acid reflux symptoms.  Visit for preventive health examination History reviewed.  Health Maintenance UTD.  Will obtains fasting labs.  Abdominal pain, unspecified site CBC, CMP, H. Pylori testing, Lipase.  Will obtain abdominal US.  Patient with GERD - given Rx prilosec. If workup unremarkable, may need referral to GI.

## 2013-06-22 NOTE — Patient Instructions (Signed)
Please obtain labs.  I will call you with your results.  Start Prilosec Liquid daily as directed.  Take a daily probiotic -- Activia Yogurt, Culturelle, Digestive advantage.  Limit caffeine intake.  You will be contacted for an Ultrasound of your abdomen. I will call you with these results.  Follow-up in 3-4 weeks.  Dieta para el reflujo gastroesofgico - Adultos  (Diet for Gastroesophageal Reflux Disease, Adult)  El reflujo se produce cuando el cido del estmago sube hacia el esfago. El esfago se irrita y duele (inflamacin). Cuando hay reflujo frecuente, y es intenso, aparece la enfermedad por reflujo gastroesofgico (ERGE). Consumir algunos alimentos puede ayudar a Crown Holdingscalmar las molestias que produce el Hartford FinancialERGE. ALIMENTOS O BEBIDAS QUE DEBE EVITAR O LIMITAR   Caf, t negro, con o sin cafena  AlbaniaBebidas (gaseosas) que contengan cafena o bebidas energizantes.  Condimentos muy picantes, como pimienta, pimienta de cayena, curry o polvo de chili.  Menta y mentol.  Chocolate.  Alimentos con alto contenido de [place], como carne, frituras, aceite, [place] y nueces.  Nils PyleFrutas y verduras que causen [place]molestias. Se incluyen ctricos y tomates.  Alcohol. Si ciertos alimentos o bebidas le producen ERGE, evtelos.  OTROS FACTORES QUE PUEDEN ALIVIAR EL ERGE SON:   Coma lentamente.  Realice 5 o 6 comidas pequeas por da y no 3 grandes.  No consuma por un tiempo los alimentos que causen problemas.  Espere 3 horas despus de comer para acostarse.  Mantenga la cabecera de la cama elevada entre 6 y 9 pulgadas (15 a -23 centmetros). Use una cua de gomaespuma o coloque bloques debajo de las patas de la cama.  Mantngase fsicamente activo. Bajar de peso, si es necesario, lo ayudar a Altria Groupaliviar las molestias  Use ropas sueltas.  No Fume ni masque tabaco. Document Released: 09/23/2011 [facility] Patient Information 2014 [place], [place].

## 2013-06-22 NOTE — Telephone Encounter (Signed)
Orders entered in Error per WCM/sls

## 2013-06-22 NOTE — Assessment & Plan Note (Signed)
Patient requesting liquid medication.  Rx Prilosec suspension daily.  Avoid late-night eating.  Elevate HOB.  Decrease alcohol. Will obtain H. Pylori testing due to occasional abdominal discomfort with acid reflux symptoms.

## 2013-06-22 NOTE — Telephone Encounter (Signed)
Patient presented to lab.  No orders placed by PCP.  Placed orders for annual labs with PSA.

## 2013-06-22 NOTE — Assessment & Plan Note (Signed)
CBC, CMP, H. Pylori testing, Lipase.  Will obtain abdominal US.  Patient with GERD - given Rx prilosec. If workup unremarkable, may need referral to GI.

## 2013-06-23 ENCOUNTER — Other Ambulatory Visit: Payer: Self-pay | Admitting: Physician Assistant

## 2013-06-23 ENCOUNTER — Telehealth: Payer: Self-pay | Admitting: Physician Assistant

## 2013-06-23 DIAGNOSIS — N2889 Other specified disorders of kidney and ureter: Secondary | ICD-10-CM

## 2013-06-23 DIAGNOSIS — K219 Gastro-esophageal reflux disease without esophagitis: Secondary | ICD-10-CM

## 2013-06-23 DIAGNOSIS — A048 Other specified bacterial intestinal infections: Secondary | ICD-10-CM

## 2013-06-23 LAB — H. PYLORI ANTIBODY, IGG: H Pylori IgG: 6.84 {ISR} — ABNORMAL HIGH

## 2013-06-23 LAB — URINALYSIS, ROUTINE W REFLEX MICROSCOPIC
Bilirubin Urine: NEGATIVE
Glucose, UA: NEGATIVE mg/dL
Hgb urine dipstick: NEGATIVE
Ketones, ur: NEGATIVE mg/dL
Leukocytes, UA: NEGATIVE
Nitrite: NEGATIVE
Protein, ur: NEGATIVE mg/dL
Specific Gravity, Urine: 1.011 (ref 1.005–1.030)
Urobilinogen, UA: 0.2 mg/dL (ref 0.0–1.0)
pH: 6.5 (ref 5.0–8.0)

## 2013-06-23 MED ORDER — OMEPRAZOLE 2 MG/ML ORAL SUSPENSION: 20.0000 mg | Freq: Two times a day (BID) | ORAL | Status: DC

## 2013-06-23 MED ORDER — METRONIDAZOLE 500 MG PO TABS: 500.0000 mg | ORAL_TABLET | Freq: Two times a day (BID) | ORAL | Status: DC

## 2013-06-23 MED ORDER — CLARITHROMYCIN 500 MG PO TABS: 500.0000 mg | ORAL_TABLET | Freq: Two times a day (BID) | ORAL | Status: DC

## 2013-06-23 NOTE — Telephone Encounter (Signed)
Will need translator -- H. Pylori testing is positive.  This infection is likely causing all of her symptoms.  She needs to take the liquid prilosec dose twice daily instead of once daily for the next 2 weeks .  Also needs to take Biaxin twice daily and Flagyl twice daily for 14 days.  This should cure the infection and relieve her symptoms.  No alcohol while on these medications. I need to see her in two weeks to reassess her symptoms.

## 2013-06-23 NOTE — Telephone Encounter (Signed)
Sent Email to [facility] Spanish Translator Julie Sowell: [name],  Could you please assist us again with patient [name] DOB: 06-03-1980, Phone [phone] in translating Important lab result information:  Piedad ClimesWilliam Cody Martin, PA-C at 06/23/2013 11:19 AM    Status: Signed     Will need translator -- H. Pylori testing is positive. This infection is likely causing all of her symptoms. She needs to take the liquid prilosec dose twice daily instead of once daily for the next 2 weeks . Also needs to take Biaxin twice daily and Flagyl twice daily for 14 days. This should cure the infection and relieve her symptoms. No alcohol while on these medications. I need to see her in two weeks to reassess her symptoms.  [facility]; it is very important that she takes as directed and completes all of the medication to resolve this bacterial infection.  Thanks so much for your time & help.  Best Regards,  Jani FilesSharon L. Alto Gandolfo, CMA (AAMA) for  Waldon MerlWilliam C. Martin, PA-C

## 2013-06-24 NOTE — Telephone Encounter (Signed)
Ms. [name],  [id number] [place] [place] - [address] [facility] [address] [name] already has a follow-up appointment scheduled for Wednesday, April 8 at 9:15am with Mr. Daphine DeutscherMartin, [place]PA-C; please inform patient to keep this appointment as her follow-up.  Thank you for your time and help with this matter.  [name], CMA (AAMA) for  Waldon MerlWilliam C. Martin, PA-C  REPLY REPLY ALL Donne AnonFORWARD  Mark as [name]   [name], [name]  Thu 06/23/2013 3:47 PM  Inbox  To:  Burnard LeighScates, Jill;  You replied on 06/24/2013 8:31 AM.  She said thank you  And asking [facility]? And her 2weeks follow up appmt, day and time when, please.  Thank You  [name]  Sent from my Boost phone.

## 2013-06-27 ENCOUNTER — Other Ambulatory Visit: Payer: Self-pay | Admitting: Physician Assistant

## 2013-06-27 ENCOUNTER — Encounter (HOSPITAL_BASED_OUTPATIENT_CLINIC_OR_DEPARTMENT_OTHER): Payer: Self-pay

## 2013-06-27 ENCOUNTER — Ambulatory Visit (HOSPITAL_BASED_OUTPATIENT_CLINIC_OR_DEPARTMENT_OTHER)
Admission: RE | Admit: 2013-06-27 | Discharge: 2013-06-27 | Disposition: A | Discharge status: Home / Self Care | Payer: BC Managed Care – PPO | Source: Ambulatory Visit | Attending: Physician Assistant | Admitting: Physician Assistant

## 2013-06-27 DIAGNOSIS — N289 Disorder of kidney and ureter, unspecified: Secondary | ICD-10-CM | POA: Insufficient documentation

## 2013-06-27 DIAGNOSIS — N2889 Other specified disorders of kidney and ureter: Secondary | ICD-10-CM

## 2013-06-27 IMAGING — CT CT ABDOMEN WO/W CM
2 of 9 series · 12 of 46 positions shown, 19 images · IV contrast (APPLIED)
Comparison: US ABDOMEN COMPLETE dated [DATE]

CLINICAL DATA: Left renal mass found on ultrasound.

EXAM:
CT ABDOMEN WITHOUT AND WITH CONTRAST
TECHNIQUE: Multidetector CT imaging of the abdomen was performed following the
standard protocol before and following the bolus administration of
intravenous contrast.
CONTRAST:  100mL OMNIPAQUE IOHEXOL 350 MG/ML SOLN

[Series 3: renal w/o 3.0 · axial · non-contrast · 0.65mm/px · z∈[-274,-48]mm · 10 of 93 slices shown, 16 images]
[im 9/93  soft-tissue]
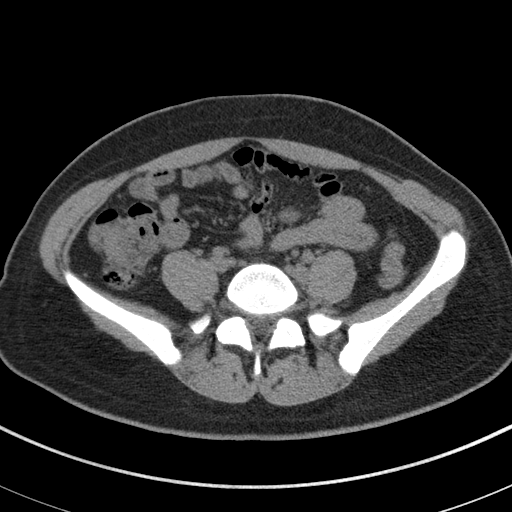
[im 9/93  bone]
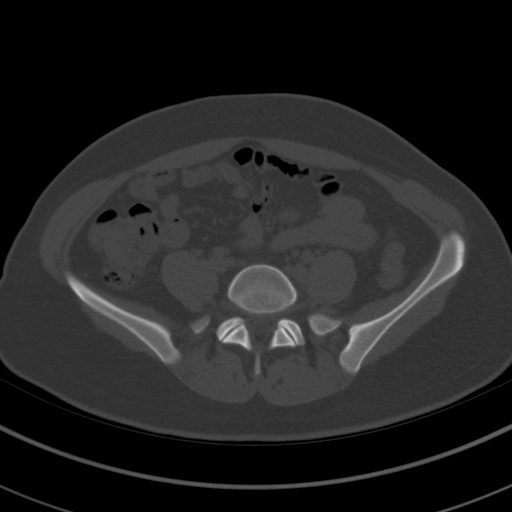
[im 17/93  soft-tissue]
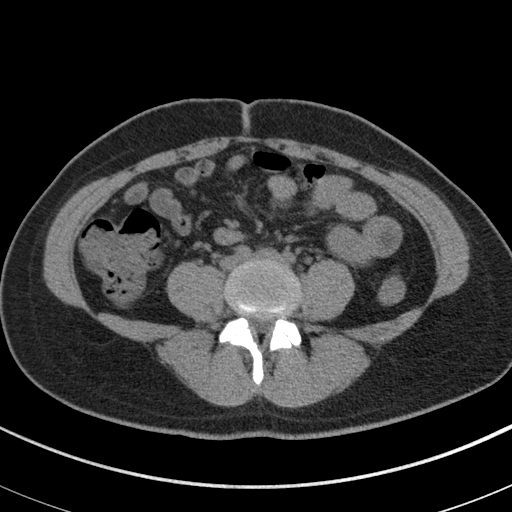
[im 26/93  soft-tissue]
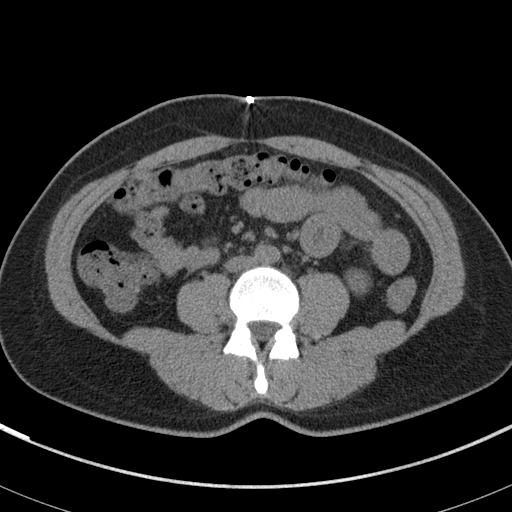
[im 34/93  soft-tissue]
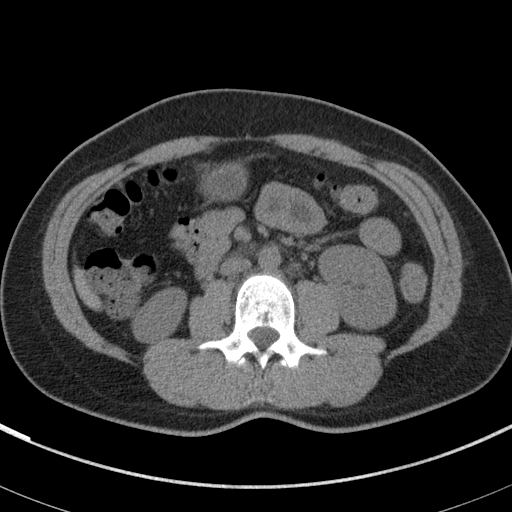
[im 42/93  soft-tissue]
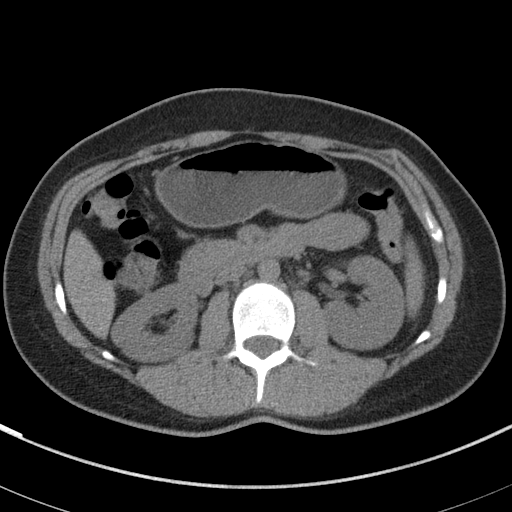
[im 51/93  soft-tissue]
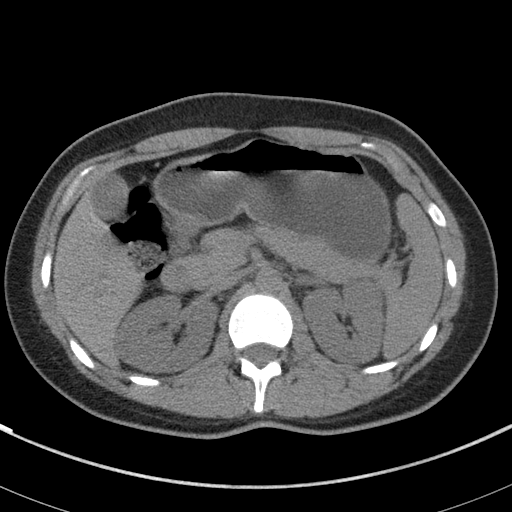
[im 59/93  soft-tissue]
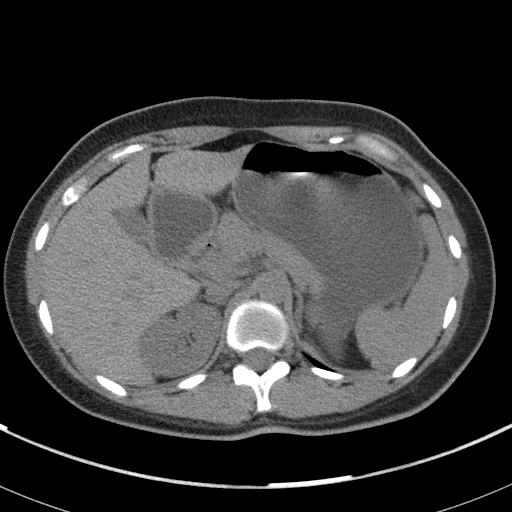
[im 59/93  lung]
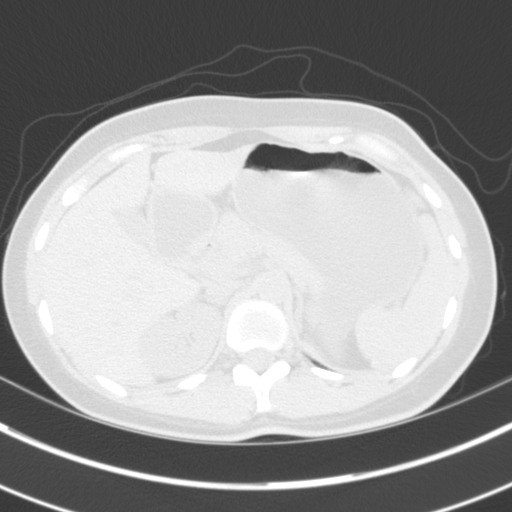
[im 67/93  soft-tissue]
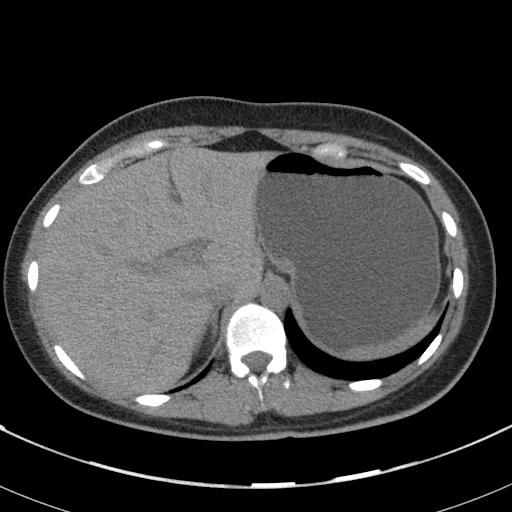
[im 67/93  lung]
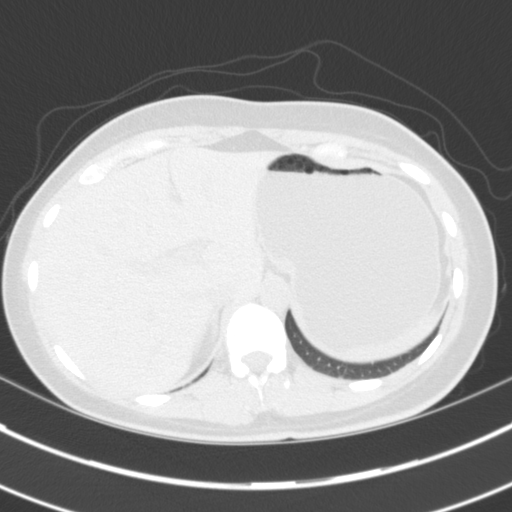
[im 76/93  soft-tissue]
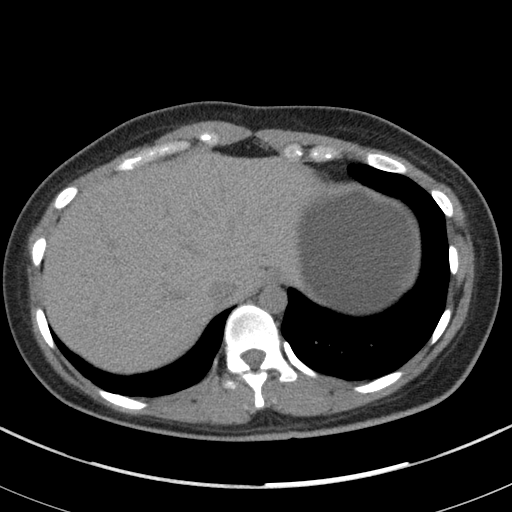
[im 76/93  lung]
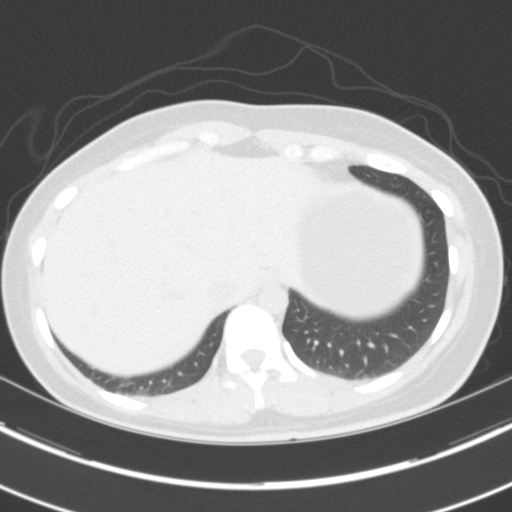
[im 76/93  bone]
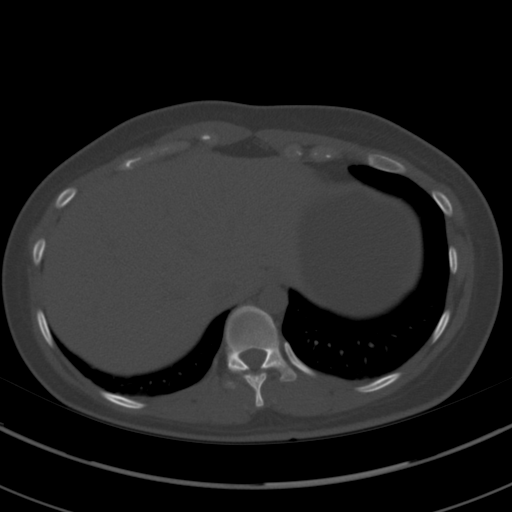
[im 84/93  soft-tissue]
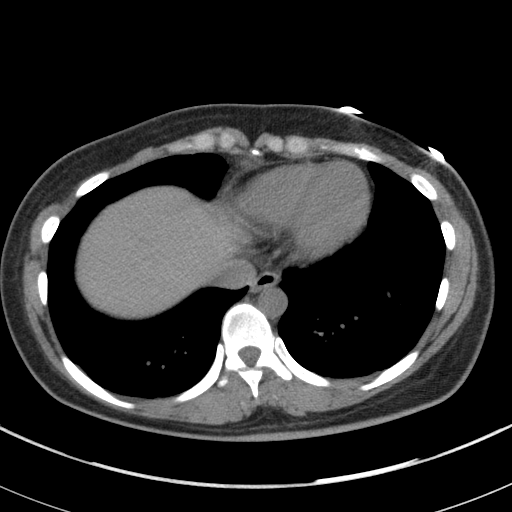
[im 84/93  lung]
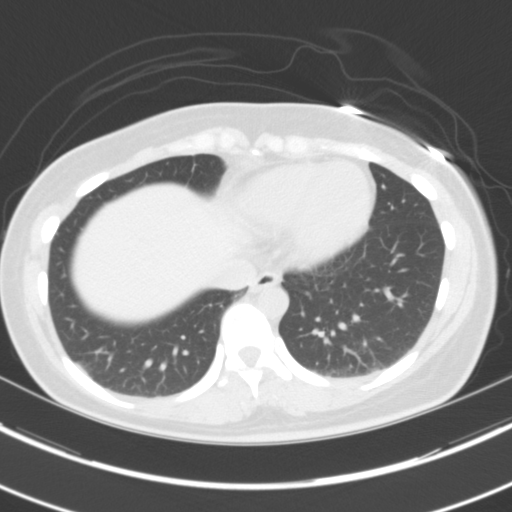

[Series 6: renal w/o 3.0 coronal · coronal · non-contrast · 0.66mm/px · 2 of 77 slices shown, 3 images]
[im 26/77  soft-tissue]
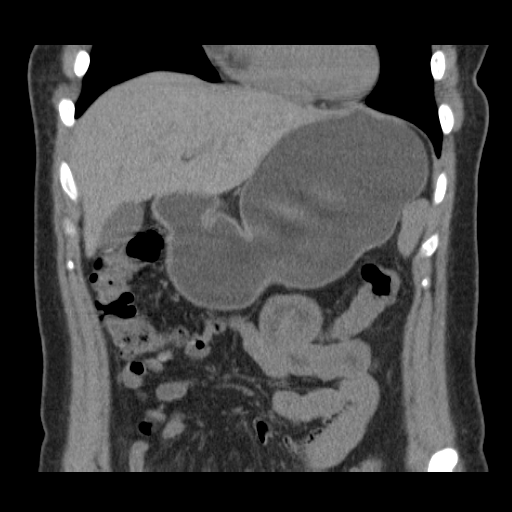
[im 26/77  bone]
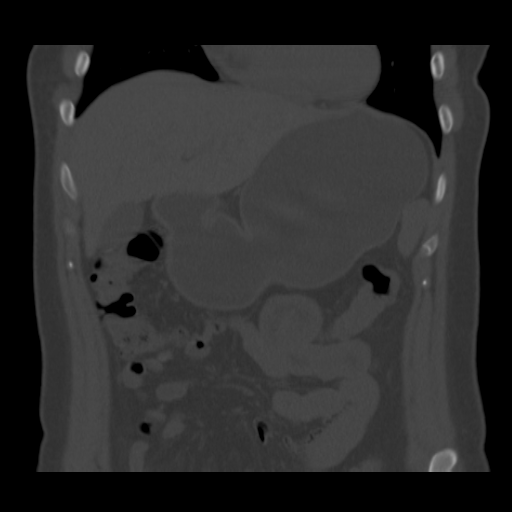
[im 51/77  soft-tissue]
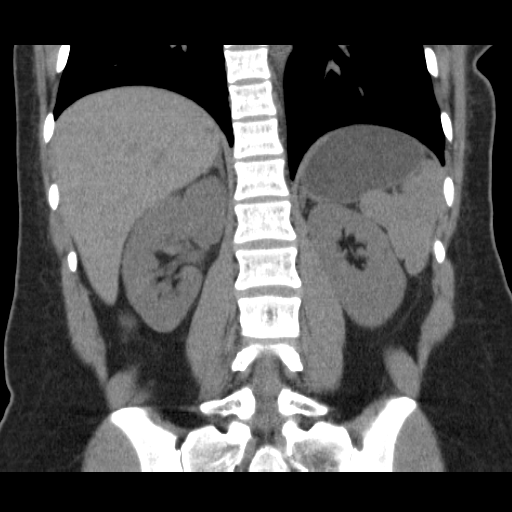

[12 of 46 positions shown; findings below may reference images not displayed]

FINDINGS: Lower Chest: Clear lung bases. Normal heart size without pericardial
or pleural effusion.

Abdomen: No renal calculi or other calcified lesions on unenhanced
images of the abdomen.

No hypervascular lesions within the liver, pancreas, or kidneys on
arterial phase images.

Nephrographic phase images demonstrate normal liver, spleen,
stomach, pancreas, gallbladder, biliary tract, adrenal glands.

No renal mass, including within the left upper pole.

No retroperitoneal or retrocrural adenopathy. Normal abdominal
portions of large and small bowel, without ascites.

Bones/Musculoskeletal: No acute osseous abnormality. Disc bulges at
L4-5 and L5-S1 incidentally noted.
IMPRESSION: No evidence of renal mass, including within the left upper pole.
This was likely artifactual. No acute abdominal process.

## 2013-06-27 MED ORDER — IOHEXOL 350 MG/ML SOLN
100.0000 mL | Freq: Once | INTRAVENOUS | Status: AC | PRN
  Administered 2013-06-27: 100 mL via INTRAVENOUS

## 2013-07-04 ENCOUNTER — Telehealth: Payer: Self-pay | Admitting: Gynecology

## 2013-07-04 ENCOUNTER — Other Ambulatory Visit: Payer: Self-pay | Admitting: Gynecology

## 2013-07-04 DIAGNOSIS — Z3049 Encounter for surveillance of other contraceptives: Secondary | ICD-10-CM

## 2013-07-04 DIAGNOSIS — Z3046 Encounter for surveillance of implantable subdermal contraceptive: Secondary | ICD-10-CM

## 2013-07-04 MED ORDER — LEVONORGESTREL 20 MCG/24HR IU IUD: INTRAUTERINE_SYSTEM | Freq: Once | INTRAUTERINE | Status: DC

## 2013-07-04 NOTE — Telephone Encounter (Signed)
07/04/13-Pt [facility] today [facility] removal of existing Mirena and insertion of new at 100%, no copay. She already has an appt for this with [facility]

## 2013-07-13 ENCOUNTER — Encounter: Payer: Self-pay | Admitting: Physician Assistant

## 2013-07-13 ENCOUNTER — Ambulatory Visit (INDEPENDENT_AMBULATORY_CARE_PROVIDER_SITE_OTHER): Payer: BC Managed Care – PPO | Admitting: Physician Assistant

## 2013-07-13 VITALS — BP 98/68 | HR 82 | Temp 98.8°F | Resp 16 | Ht 61.0 in | Wt 145.2 lb

## 2013-07-13 DIAGNOSIS — A048 Other specified bacterial intestinal infections: Secondary | ICD-10-CM

## 2013-07-13 DIAGNOSIS — R109 Unspecified abdominal pain: Secondary | ICD-10-CM

## 2013-07-13 DIAGNOSIS — K219 Gastro-esophageal reflux disease without esophagitis: Secondary | ICD-10-CM

## 2013-07-13 NOTE — Assessment & Plan Note (Signed)
Resolved

## 2013-07-13 NOTE — Progress Notes (Signed)
Patient presents to clinic today for 3-week follow-up after initiating triple therapy for H. Pylori infection.  Since starting treatment, patient states she has taken all medication as described.  Patient endorses resolution of abdominal pain and nausea.  Patient still with occasional acid reflux but is controlled with Prilosec.  Patient denies fever, chills, tenesmus, hematochezia, melena, N/V.  Does state her stools have been darker, but she wouldn't call them melanic.  At last visit, abdominal US was performed giving patient's symptoms and symptom duration.  Possible renal mass was discovered with [place].  CT was performed and was negative for mass.  Radiologist feels "mass" noted on [place] was artifact and not an actual pathology.  Patient has been informed of her results.    No past medical history on file.  Current Outpatient Prescriptions on File Prior to Visit  Medication Sig Dispense Refill  . omeprazole (PRILOSEC) 2 mg/mL SUSP Take 10 mLs (20 mg total) by mouth 2 (two) times daily before a meal.  300 mL  1   Current Facility-Administered Medications on File Prior to Visit  Medication Dose Route Frequency Provider Last Rate Last Dose  . levonorgestrel (MIRENA) 20 MCG/24HR IUD   Intrauterine Once Terrance Mass, MD        No Known Allergies  Family History  Problem Relation Age of Onset  . Breast cancer Mother   . Hypertension Father   . Breast cancer Maternal Grandmother     History   Social History  . Marital Status: Married    Spouse Name: N/A    Number of Children: N/A  . Years of Education: N/A   Social History Main Topics  . Smoking status: Light Tobacco Smoker  . Smokeless tobacco: Never Used  . Alcohol Use: Yes     Comment: socially  . Drug Use: No  . Sexual Activity: Yes     Comment: MIRENA   Other Topics Concern  . None   Social History Narrative  . None   Review of Systems - See HPI.  All other ROS are negative.  BP 98/68  Pulse 82  Temp(Src) 98.8 F  (37.1 C) (Oral)  Resp 16  Ht 5' 1"  (1.549 m)  Wt 145 lb 4 oz (65.885 kg)  BMI 27.46 kg/m2  SpO2 98%  Physical Exam  Vitals reviewed. Constitutional: She is oriented to person, place, and time and well-developed, well-nourished, and in no distress.  HENT:  Head: Normocephalic and atraumatic.  Right Ear: External ear normal.  Left Ear: External ear normal.  Nose: Nose normal.  Mouth/Throat: Oropharynx is clear and moist. No oropharyngeal exudate.  Eyes: Conjunctivae are normal. Pupils are equal, round, and reactive to light.  Cardiovascular: Normal rate, regular rhythm, normal heart sounds and intact distal pulses.   Pulmonary/Chest: Effort normal and breath sounds normal. No respiratory distress. She has no wheezes. She has no rales. She exhibits no tenderness.  Abdominal: Soft. Bowel sounds are normal. She exhibits no distension and no mass. There is no tenderness. There is no rebound and no guarding.  Neurological: She is alert and oriented to person, place, and time.  Skin: Skin is warm and dry. No rash noted.  Psychiatric: Affect normal.    Recent Results (from the past 2160 hour(s))  URINALYSIS W MICROSCOPIC + REFLEX CULTURE     Status: Abnormal   Collection Time    06/20/13  4:34 PM      Result Value Ref Range   Color, Urine YELLOW  YELLOW  APPearance CLEAR  CLEAR   Specific Gravity, Urine <1.005 (*) 1.005 - 1.030   pH 6.0  5.0 - 8.0   Glucose, UA NEG  NEG mg/dL   Bilirubin Urine NEG  NEG   Ketones, ur NEG  NEG mg/dL   Hgb urine dipstick NEG  NEG   Protein, ur NEG  NEG mg/dL   Urobilinogen, UA 0.2  0.0 - 1.0 mg/dL   Nitrite NEG  NEG   Leukocytes, UA NEG  NEG   Squamous Epithelial / LPF NONE SEEN  RARE   Crystals NONE SEEN  NONE SEEN   Casts NONE SEEN  NONE SEEN   WBC, UA 0-2  <3 WBC/hpf   RBC / HPF 0-2  <3 RBC/hpf   Bacteria, UA FEW (*) RARE  URINE CULTURE     Status: None   Collection Time    06/20/13  4:34 PM      Result Value Ref Range   Colony Count  20,OOO COLONIES/ML     Organism ID, Bacteria Multiple bacterial morphotypes present, none     Organism ID, Bacteria predominant. Suggest appropriate recollection if      Organism ID, Bacteria clinically indicated.    CBC WITH DIFFERENTIAL     Status: None   Collection Time    06/20/13  4:38 PM      Result Value Ref Range   WBC 6.6  4.0 - 10.5 K/uL   RBC 4.45  3.87 - 5.11 MIL/uL   Hemoglobin 13.5  12.0 - 15.0 g/dL   HCT 39.4  36.0 - 46.0 %   MCV 88.5  78.0 - 100.0 fL   MCH 30.3  26.0 - 34.0 pg   MCHC 34.3  30.0 - 36.0 g/dL   RDW 13.4  11.5 - 15.5 %   Platelets 244  150 - 400 K/uL   Neutrophils Relative % 65  43 - 77 %   Neutro Abs 4.3  1.7 - 7.7 K/uL   Lymphocytes Relative 29  12 - 46 %   Lymphs Abs 1.9  0.7 - 4.0 K/uL   Monocytes Relative 5  3 - 12 %   Monocytes Absolute 0.3  0.1 - 1.0 K/uL   Eosinophils Relative 1  0 - 5 %   Eosinophils Absolute 0.1  0.0 - 0.7 K/uL   Basophils Relative 0  0 - 1 %   Basophils Absolute 0.0  0.0 - 0.1 K/uL   Smear Review Criteria for review not met    COMPREHENSIVE METABOLIC PANEL     Status: None   Collection Time    06/20/13  4:38 PM      Result Value Ref Range   Sodium 136  135 - 145 mEq/L   Potassium 3.5  3.5 - 5.3 mEq/L   Chloride 103  96 - 112 mEq/L   CO2 23  19 - 32 mEq/L   Glucose, Bld 94  70 - 99 mg/dL   BUN 6  6 - 23 mg/dL   Creat 0.55  0.50 - 1.10 mg/dL   Total Bilirubin 0.7  0.2 - 1.2 mg/dL   Alkaline Phosphatase 43  39 - 117 U/L   AST 13  0 - 37 U/L   ALT 18  0 - 35 U/L   Total Protein 7.2  6.0 - 8.3 g/dL   Albumin 4.6  3.5 - 5.2 g/dL   Calcium 9.4  8.4 - 10.5 mg/dL  CHOLESTEROL, TOTAL     Status: None  Collection Time    06/20/13  4:38 PM      Result Value Ref Range   Cholesterol 166  0 - 200 mg/dL   Comment: ATP III Classification:           < 200        mg/dL        Desirable          200 - 239     mg/dL        Borderline High          >= 240        mg/dL        High        TSH     Status: None   Collection Time     06/20/13  4:38 PM      Result Value Ref Range   TSH 2.724  0.350 - 4.500 uIU/mL  RPR     Status: None   Collection Time    06/20/13  4:38 PM      Result Value Ref Range   RPR NON REAC  NON REAC  HIV ANTIBODY (ROUTINE TESTING)     Status: None   Collection Time    06/20/13  4:38 PM      Result Value Ref Range   HIV NON REACTIVE  NON REACTIVE   Comment:       Effective July 11, 2013, [employer] will no longer offer the     current 3rd Generation HIV diagnostic screening assay, HIV Antibodies,     HIV-1/2 EIA, with reflexes. At that time, [employer] will     only offer HIV-1/2 Ag/Ab, 4th Gen, w/ Reflexes as recommended by the     CDC. This HIV diagnostic screening assay tests for antibodies to HIV-1     and HIV-2 as well as HIV p24 antigen and provides greater sensitivity     for the detection of recent infection. Any orders for the 3rd     Generation assay will automatically be referred to the 4th Generation     assay.  HEPATITIS C ANTIBODY     Status: None   Collection Time    06/20/13  4:38 PM      Result Value Ref Range   HCV Ab NEGATIVE  NEGATIVE  HEPATITIS B SURFACE ANTIGEN     Status: None   Collection Time    06/20/13  4:38 PM      Result Value Ref Range   Hepatitis B Surface Ag NEGATIVE  NEGATIVE  WET PREP FOR TRICH, YEAST, CLUE     Status: None   Collection Time    06/20/13  4:43 PM      Result Value Ref Range   Yeast Wet Prep HPF POC NONE SEEN  NONE SEEN   Trich, Wet Prep NONE SEEN  NONE SEEN   Clue Cells Wet Prep HPF POC NONE SEEN  NONE SEEN   WBC, Wet Prep HPF POC FEW  NONE SEEN   Comment: FEW BACTERIA SEEN     (3-6) EPITH. CELLS PER HPF     AMINE NEGATIVE  GC/CHLAMYDIA PROBE AMP     Status: None   Collection Time    06/20/13  4:43 PM      Result Value Ref Range   CT Probe RNA NEGATIVE     GC Probe RNA NEGATIVE     Comment:                                                                                             **  Normal Reference  Range: Negative**                 Assay performed using the Gen-Probe APTIMA COMBO2 (R) Assay.           Acceptable specimen types for this assay include APTIMA Swabs (Unisex,     endocervical, urethral, or vaginal), first void urine, and ThinPrep     liquid based cytology samples.  CBC WITH DIFFERENTIAL     Status: None   Collection Time    06/22/13  9:49 AM      Result Value Ref Range   WBC 4.9  4.0 - 10.5 K/uL   RBC 4.48  3.87 - 5.11 MIL/uL   Hemoglobin 13.5  12.0 - 15.0 g/dL   HCT 39.6  36.0 - 46.0 %   MCV 88.4  78.0 - 100.0 fL   MCH 30.1  26.0 - 34.0 pg   MCHC 34.1  30.0 - 36.0 g/dL   RDW 13.5  11.5 - 15.5 %   Platelets 249  150 - 400 K/uL   Neutrophils Relative % 60  43 - 77 %   Neutro Abs 2.9  1.7 - 7.7 K/uL   Lymphocytes Relative 30  12 - 46 %   Lymphs Abs 1.5  0.7 - 4.0 K/uL   Monocytes Relative 8  3 - 12 %   Monocytes Absolute 0.4  0.1 - 1.0 K/uL   Eosinophils Relative 2  0 - 5 %   Eosinophils Absolute 0.1  0.0 - 0.7 K/uL   Basophils Relative 0  0 - 1 %   Basophils Absolute 0.0  0.0 - 0.1 K/uL   Smear Review Criteria for review not met    BASIC METABOLIC PANEL     Status: None   Collection Time    06/22/13  9:49 AM      Result Value Ref Range   Sodium 136  135 - 145 mEq/L   Potassium 3.8  3.5 - 5.3 mEq/L   Chloride 102  96 - 112 mEq/L   CO2 26  19 - 32 mEq/L   Glucose, Bld 94  70 - 99 mg/dL   BUN 6  6 - 23 mg/dL   Creat 0.57  0.50 - 1.10 mg/dL   Calcium 9.5  8.4 - 10.5 mg/dL  HEPATIC FUNCTION PANEL     Status: None   Collection Time    06/22/13  9:49 AM      Result Value Ref Range   Total Bilirubin 0.9  0.2 - 1.2 mg/dL   Bilirubin, Direct 0.2  0.0 - 0.3 mg/dL   Indirect Bilirubin 0.7  0.2 - 1.2 mg/dL   Alkaline Phosphatase 51  39 - 117 U/L   AST 16  0 - 37 U/L   ALT 18  0 - 35 U/L   Total Protein 7.4  6.0 - 8.3 g/dL   Albumin 4.7  3.5 - 5.2 g/dL  TSH     Status: None   Collection Time    06/22/13  9:49 AM      Result Value Ref Range   TSH 3.824   0.350 - 4.500 uIU/mL  URINALYSIS, ROUTINE W REFLEX MICROSCOPIC     Status: None   Collection Time    06/22/13  9:49 AM      Result Value Ref Range   Color, Urine YELLOW  YELLOW   APPearance CLEAR  CLEAR   Specific Gravity, Urine 1.011  1.005 - 1.030   pH  6.5  5.0 - 8.0   Glucose, UA NEG  NEG mg/dL   Bilirubin Urine NEG  NEG   Ketones, ur NEG  NEG mg/dL   Hgb urine dipstick NEG  NEG   Protein, ur NEG  NEG mg/dL   Urobilinogen, UA 0.2  0.0 - 1.0 mg/dL   Nitrite NEG  NEG   Leukocytes, UA NEG  NEG  LIPID PANEL     Status: None   Collection Time    06/22/13  9:49 AM      Result Value Ref Range   Cholesterol 159  0 - 200 mg/dL   Comment: ATP III Classification:           < 200        mg/dL        Desirable          200 - 239     mg/dL        Borderline High          >= 240        mg/dL        High         Triglycerides 88  <150 mg/dL   HDL 43  >39 mg/dL   Total CHOL/HDL Ratio 3.7     VLDL 18  0 - 40 mg/dL   LDL Cholesterol 98  0 - 99 mg/dL   Comment:       Total Cholesterol/HDL Ratio:CHD Risk                            Coronary Heart Disease Risk Table                                            Men       Women              1/2 Average Risk              3.4        3.3                  Average Risk              5.0        4.4               2X Average Risk              9.6        7.1               3X Average Risk             23.4       11.0     Use the calculated Patient Ratio above and the CHD Risk table      to determine the patient's CHD Risk.     ATP III Classification (LDL):           < 100        mg/dL         Optimal          100 - 129     mg/dL         Near or Above Optimal          130 - 159     mg/dL  Borderline High          160 - 189     mg/dL         High           > 190        mg/dL         Very High        H. PYLORI ANTIBODY, IGG     Status: Abnormal   Collection Time    06/22/13  9:49 AM      Result Value Ref Range   H Pylori IgG 6.84 (*)     Comment: IgG antibody to H. pylori detected suggestive of previous exposure or     active infection.              ISR = Immune Status Ratio                  <0.90         ISR       Negative                  0.90 - 1.09   ISR       Equivocal                  >=1.10        ISR       Positive           The above results were obtained with the Immulite 2000 H. pylori IgG     EIA.  Results obtained from other manufacturer's assay methods may not     be used interchangeably.         Assessment/Plan: H. pylori infection Resolution of abdominal pain.  Continuing with Prilosec BID for now.  Will obtain IFOB to rule out occult blood in stool.  Patient to return in 2-3 weeks for H. Pylori test of cure.  GERD (gastroesophageal reflux disease) Continue current regimen.  Abdominal pain, unspecified site Resolved.

## 2013-07-13 NOTE — Assessment & Plan Note (Signed)
Resolution of abdominal pain.  Continuing with Prilosec BID for now.  Will obtain IFOB to rule out occult blood in stool.  Patient to return in 2-3 weeks for H. Pylori test of cure.

## 2013-07-13 NOTE — Assessment & Plan Note (Signed)
Continue current regimen

## 2013-07-13 NOTE — Patient Instructions (Signed)
I am glad you are feeling better.  Please continue the Prilosec as directed.  I would like for you to get a stool sample and use the kit provided to send in the sample to our lab.  Return to our office in 2-3 weeks for lab work.  This will let us know if the stomach infection has been exterminated.  If you develop a recurrence of abdominal pain or rectal bleeding, please return or call the office.  We will need to send you to a specialist.

## 2013-07-14 ENCOUNTER — Encounter: Payer: Self-pay | Admitting: Gynecology

## 2013-07-14 ENCOUNTER — Ambulatory Visit (INDEPENDENT_AMBULATORY_CARE_PROVIDER_SITE_OTHER): Payer: BC Managed Care – PPO | Admitting: Gynecology

## 2013-07-14 VITALS — BP 116/78

## 2013-07-14 DIAGNOSIS — N942 Vaginismus: Secondary | ICD-10-CM

## 2013-07-14 DIAGNOSIS — Z803 Family history of malignant neoplasm of breast: Secondary | ICD-10-CM

## 2013-07-14 DIAGNOSIS — Z30433 Encounter for removal and reinsertion of intrauterine contraceptive device: Secondary | ICD-10-CM

## 2013-07-14 DIAGNOSIS — Z975 Presence of (intrauterine) contraceptive device: Secondary | ICD-10-CM | POA: Insufficient documentation

## 2013-07-14 MED ORDER — KETOROLAC TROMETHAMINE 30 MG/ML IJ SOLN
30.0000 mg | Freq: Once | INTRAMUSCULAR | Status: AC
  Administered 2013-07-14: 30 mg via INTRAMUSCULAR

## 2013-07-14 NOTE — Patient Instructions (Signed)
Levonorgestrel intrauterine device (IUD) Qu es este medicamento? El LEVONORGESTREL (DIU) es un dispositivo anticonceptivo (control de natalidad). El dispositivo se coloca dentro del tero por un profesional de la salud. Se utiliza para evitar el embarazo y tambin se puede utilizar para tratar el sangrado abundante que ocurre durante su perodo. Dependiendo del dispositivo, se puede utilizar por 3 a 5 aos. Este medicamento puede ser utilizado para otros usos; si tiene alguna pregunta consulte con su proveedor de atencin mdica o con su farmacutico. MARCAS COMERCIALES DISPONIBLES: Mirena, Skyla Qu le debo informar a mi profesional de la salud antes de tomar este medicamento? Necesita saber si usted presenta alguno de los siguientes problemas o situaciones: -exmen de Papanicolaou anormal -cncer de mama, cuello del tero o tero -diabetes -endometritis -si tiene una infeccin plvica o genital actual o en el pasado -tiene ms de una pareja sexual o si su pareja tiene ms de una pareja -enfermedad cardiaca -antecedente de embarazo tubrico o ectpico -problemas del sistema inmunolgico -DIU colocado -enfermedad heptica o tumor del hgado -problemas con la coagulacin o si toma diluyentes sanguneos -usa medicamentos intravenoso -forma inusual del tero -sangrado vaginal que no tiene explicacin -una reaccin alrgica o inusual al levonorgestrel, a otras hormonas, a la silicona o polietilenos, a otros medicamentos, alimentos, colorantes o conservantes -si est embarazada o buscando quedar embarazada -si est amamantando a un beb Cmo debo utilizar este medicamento? Un profesional de la salud coloca este dispositivo en el tero. Hable con su pediatra para informarse acerca del uso de este medicamento en nios. Puede requerir atencin especial. Sobredosis: Pngase en contacto inmediatamente con un centro toxicolgico o una sala de urgencia si usted cree que haya tomado demasiado  medicamento. ATENCIN: Este medicamento es solo para usted. No comparta este medicamento con nadie. Qu sucede si me olvido de una dosis? No se aplica en este caso. Qu puede interactuar con este medicamento? No tome esta medicina con ninguno de los siguientes medicamentos: -amprenavir -bosentano -fosamprenavir Esta medicina tambin puede interactuar con los siguientes medicamentos: -aprepitant -barbitricos para producir el sueo o para el tratamiento de convulsiones -bexaroteno -griseofulvina -medicamentos para tratar los convulsiones, tales como carbamazepina, etotona, felbamato, oxcarbazepina, fenitona, topiramato -modafinilo -pioglitazona -rifabutina -rifampicina -rifapentina -algunos medicamentos para tratar el virus VIH, tales como atazanavir, indinavir, lopinavir, nelfinavir, tipranavir, ritonavir -hierba de [place] -warfarina Puede ser que esta lista no menciona todas las posibles interacciones. Informe a su profesional de la salud de todos los productos a base de hierbas, medicamentos de venta libre o suplementos nutritivos que est tomando. Si usted fuma, consume bebidas alcohlicas o si utiliza drogas ilegales, indqueselo tambin a su profesional de la salud. Algunas sustancias pueden interactuar con su medicamento. A qu debo estar atento al usar este medicamento? Visite a su mdico o a su profesional de la salud para chequear su evolucin peridicamente. Visite a su mdico si usted o su pareja tiene relaciones sexuales con otras personas, se vuelve VIH positivo o contrae una enfermedad de transmisin sexual. Este medicamento no la protege de la infeccin por VIH (SIDA) ni de ninguna otra enfermedad de transmisin sexual. Puede controlar la ubicacin del DIU usted misma palpando con sus dedos limpios los hilos en la parte anterior de la vagina. No tire de los hilos. Es un buen hbito controlar la ubicacin del dispositivo despus de cada perodo menstrual. Si no slo  siente los hilos sino que adems siente otra parte ms del DIU o si no puede sentir los hilos, consulte a su mdico   inmediatamente. El DIU puede salirse por s solo. Puede quedar embarazada si el dispositivo se sale de su lugar. Utilice un mtodo anticonceptivo adicional, como preservativos, y consulte a su proveedor de atencin mdica s observa que el DIU se sali de su lugar. La utilizacin de tampones no cambia la posicin del DIU y no hay inconvenientes en usarlos durante su perodo. Qu efectos secundarios puedo tener al utilizar este medicamento? Efectos secundarios que debe informar a su mdico o a su profesional de la salud tan pronto como sea posible: -reacciones alrgicas como erupcin cutnea, picazn o urticarias, hinchazn de la cara, labios o lengua -fiebre, sntomas gripales -llagas genitales -alta presin sangunea -ausencia de un perodo menstrual durante 6 semanas mientras lo utiliza -dolor, hinchazn o calor en las piernas -dolor o sensibilidad del plvico -dolor de cabeza repentino o severo -signos de embarazo -calambres estomacales -falta de aliento repentina -problemas de coordinacin, del habla, al caminar -sangrado, flujo vaginal inusual -color amarillento de los ojos o la piel Efectos secundarios que, por lo general, no requieren atencin mdica (debe informarlos a su mdico o a su profesional de la salud si persisten o si son molestos): -acn -dolor de pecho -cambios en el deseo sexual o capacidad -cambios de peso -calambres, mareos o sensacin de desmayo mientras se introduce el dispositivo -dolor de cabeza -sangrado menstruales irregulares en los primeros 3 a 6 meses de usar -nuseas Puede ser que esta lista no menciona todos los posibles efectos secundarios. Comunquese a su mdico por asesoramiento mdico sobre los efectos secundarios. Usted puede informar los efectos secundarios a la FDA por telfono al 1-800-FDA-1088. Dnde debo guardar mi medicina? No  se aplica en este caso. ATENCIN: Este folleto es un resumen. Puede ser que no cubra toda la posible informacin. Si usted tiene preguntas acerca de esta medicina, consulte con su mdico, su farmacutico o su profesional de la salud.  2014, [employer]. (2011-05-13 16:57:41)  

## 2013-07-14 NOTE — Progress Notes (Signed)
                                                                     IUD procedure note       Patient presented to the office today for removal of outdated Mirena IUD with placement  of new Mirena IUD. The patient had previously been provided with literature information on this method of contraception. The risks benefits and pros and cons were discussed and all her questions were answered. She is fully aware that this form of contraception is 99% effective and is good for 5 years.  Pelvic exam: Bartholin urethra Skene glands: Within normal limits Vagina: No lesions or discharge Cervix: No lesions or discharge Uterus: Anteverted position Adnexa: No masses or tenderness Rectal exam: Not done  The cervix was cleansed with Betadine solution. A single-tooth tenaculum was placed on the anterior cervical lip. The uterus sounded to 7 1/2 centimeter. The IUD was shown to the patient and inserted in a sterile fashion. The IUD string was trimmed. The single-tooth tenaculum was removed. Patient was instructed to return back to the office in one month for follow up.       Patient was given Toradol 30 mg IM prior to procedure.  Lot number:[id number]

## 2013-07-18 ENCOUNTER — Telehealth: Payer: Self-pay | Admitting: Genetic Counselor

## 2013-07-18 ENCOUNTER — Encounter: Payer: Self-pay | Admitting: Gynecology

## 2013-07-18 NOTE — Telephone Encounter (Signed)
S/W PATIENT BY PACIFIC INTERPRETER AND GAVE GENETIC APPT FOR 05/13 @ 12:30. INTERPETER SCHEDULED.

## 2013-07-18 NOTE — Telephone Encounter (Signed)
Pt scheduled on 08/17/13 @ 12:30 pm

## 2013-08-05 ENCOUNTER — Telehealth: Payer: Self-pay | Admitting: *Deleted

## 2013-08-05 ENCOUNTER — Other Ambulatory Visit: Payer: BC Managed Care – PPO

## 2013-08-05 DIAGNOSIS — A048 Other specified bacterial intestinal infections: Secondary | ICD-10-CM

## 2013-08-05 LAB — FECAL OCCULT BLOOD, IMMUNOCHEMICAL: FECAL OCCULT BLD: NEGATIVE

## 2013-08-05 NOTE — Telephone Encounter (Signed)
[place] w[facility] called to ask for IFOB order to be re-entered in EMR as future; order placed/SLS

## 2013-08-11 ENCOUNTER — Ambulatory Visit: Payer: BC Managed Care – PPO | Admitting: Gynecology

## 2013-08-17 ENCOUNTER — Other Ambulatory Visit: Payer: Self-pay | Admitting: Gynecology

## 2013-08-17 ENCOUNTER — Ambulatory Visit (HOSPITAL_BASED_OUTPATIENT_CLINIC_OR_DEPARTMENT_OTHER): Payer: BC Managed Care – PPO | Admitting: Genetic Counselor

## 2013-08-17 ENCOUNTER — Ambulatory Visit (INDEPENDENT_AMBULATORY_CARE_PROVIDER_SITE_OTHER): Payer: BC Managed Care – PPO

## 2013-08-17 ENCOUNTER — Ambulatory Visit (INDEPENDENT_AMBULATORY_CARE_PROVIDER_SITE_OTHER): Payer: BC Managed Care – PPO | Admitting: Gynecology

## 2013-08-17 ENCOUNTER — Encounter: Payer: Self-pay | Admitting: Gynecology

## 2013-08-17 ENCOUNTER — Other Ambulatory Visit: Payer: BC Managed Care – PPO

## 2013-08-17 ENCOUNTER — Encounter: Payer: Self-pay | Admitting: Genetic Counselor

## 2013-08-17 VITALS — BP 118/76

## 2013-08-17 DIAGNOSIS — N83202 Unspecified ovarian cyst, left side: Secondary | ICD-10-CM

## 2013-08-17 DIAGNOSIS — N83209 Unspecified ovarian cyst, unspecified side: Secondary | ICD-10-CM

## 2013-08-17 DIAGNOSIS — N942 Vaginismus: Secondary | ICD-10-CM

## 2013-08-17 DIAGNOSIS — Z975 Presence of (intrauterine) contraceptive device: Secondary | ICD-10-CM

## 2013-08-17 DIAGNOSIS — Z803 Family history of malignant neoplasm of breast: Secondary | ICD-10-CM

## 2013-08-17 DIAGNOSIS — Z30431 Encounter for routine checking of intrauterine contraceptive device: Secondary | ICD-10-CM

## 2013-08-17 NOTE — Patient Instructions (Signed)
Marcador tumoral CA-125 (CA-125 Tumor Marker) El CA-125 es un marcador tumoral que sirve para controlar la evolucin del cncer de ovario o de endometrio. PREPARACIN PARA EL ESTUDIO No se requiere una preparacin especial. HALLAZGOS NORMALES Adultos: 0a35unidades/ml (0a35miliunidades/l) Los rangos para los resultados normales pueden variar entre diferentes laboratorios y hospitales. Consulte siempre con su mdico despus de hacer el estudio para conocer el significado de los resultados y si los valores se consideran "dentro de los lmites normales". SIGNIFICADO DEL ESTUDIO  El mdico leer los resultados y hablar con usted sobre la importancia y el significado de los resultados, as como las opciones de tratamiento y la necesidad de realizar pruebas adicionales, si fuera necesario. OBTENCIN DE LOS RESULTADOS DE LAS PRUEBAS Es su responsabilidad retirar el resultado del estudio. Consulte en el laboratorio cundo y cmo podr obtener los resultados. Document Released: 01/12/2013 ExitCare Patient Information 2014 ExitCare, LLC. Quiste ovrico (Ovarian Cyst) Un quiste ovrico es una bolsa llena de lquido que se forma en el ovario. Los ovarios son los rganos pequeos que producen vulos en las mujeres. Se pueden formar varios tipos de quistes en los ovarios. La mayora no son cancerosos. Muchos de ellos no causan problemas y con frecuencia desaparecen solos. Algunos pueden provocar sntomas y requerir tratamiento. Los tipos ms comunes de quistes ovricos son los siguientes:  Quistes funcionales: estos quistes pueden aparecer todos los meses durante el ciclo menstrual. Esto es normal. Estos quistes suelen desaparecer con el prximo ciclo menstrual si la mujer no queda embarazada. En general, los quistes funcionales no tienen sntomas.  Endometriomas: estos quistes se forman a partir del tejido que recubre el tero. Tambin se denominan "quistes de chocolate" porque se llenan de sangre que  se vuelve marrn. Este tipo de quiste puede provocar dolor en la zona inferior del abdomen durante la relacin sexual y con el perodo menstrual.  Cistoadenomas: este tipo se desarrolla a partir de las clulas que se ubican en el exterior del ovario. Estos quistes pueden ser muy grandes y causar dolor en la zona inferior del abdomen y durante la relacin sexual. Este tipo de quiste puede girar sobre s mismo, cortar el suministro de sangre y causar un dolor intenso. Tambin se puede romper con facilidad y provocar mucho dolor.  Quistes dermoides: este tipo de quiste a veces se encuentra en ambos ovarios. Estos quistes pueden contener diferentes tipos de tejidos del organismo, como piel, dientes, pelo o cartlago. Generalmente no tienen sntomas, a menos que sean muy grandes.  Quistes tecalutenicos: aparecen cuando se produce demasiada cantidad de cierta hormona (gonadotropina corinica humana) que estimula en exceso al ovario para que produzca vulos. Esto es ms frecuente despus de procedimientos que ayudan a la concepcin de un beb (fertilizacin in vitro). CAUSAS   Los medicamentos para la fertilidad pueden provocar una afeccin mediante la cual se forman mltiples quistes de gran tamao en los ovarios. Esta se denomina sndrome de hiperestimulacin ovrica.  El sndrome del ovario poliqustico es una afeccin que puede causar desequilibrios hormonales, los cuales pueden dar como resultado quistes ovricos no funcionales. SIGNOS Y SNTOMAS  Muchos quistes ovricos no causan sntomas. Si se presentan sntomas, stos pueden ser:  Dolor o molestias en la pelvis.  Dolor en la parte baja del abdomen.  Dolor durante las relaciones sexuales.  Aumento del permetro abdominal (hinchazn).  Perodos menstruales anormales.  Aumento del dolor en los perodos menstruales.  Cese de los perodos menstruales sin estar embarazada. DIAGNSTICO  Estos quistes se descubren comnmente durante   un examen  de rutina o una exploracin ginecolgica anual. Es posible que se ordenen otros estudios para obtener ms informacin sobre el quiste. Estos estudios pueden ser:  Ecografas.  Radiografas de la pelvis.  Tomografa computada.  Resonancia magntica.  Anlisis de sangre. TRATAMIENTO  Muchos de los quistes ovricos desaparecen por s solos, sin tratamiento. Es probable que el mdico quiera controlar el quiste regularmente durante 2 o 3meses para ver si se produce algn cambio. En el caso de las mujeres en la menopausia, es particularmente importante controlar de cerca al quiste ya que el ndice de cncer de ovario en las mujeres menopusicas es ms alto. Cuando se requiere tratamiento, este puede incluir cualquiera de los siguientes:  Un procedimiento para drenar el quiste (aspiracin). Esto se puede realizar mediante el uso de una aguja grande y una ecografa. Tambin se puede hacer a travs de un procedimiento laparoscpico, En este procedimiento, se inserta un tubo delgado que emite luz y que tiene una pequea cmara en un extremo (laparoscopio) a travs de una pequea incisin.  Ciruga para extirpar el quiste completo. Esto se puede realizar mediante una ciruga laparoscpica o una ciruga abierta, la cual implica realizar una incisin ms grande en la parte inferior del abdomen.  Tratamiento hormonal o pldoras anticonceptivas. Estos mtodos a veces se usan para ayudar a disolver un quiste. INSTRUCCIONES PARA EL CUIDADO EN EL HOGAR   Tome solo medicamentos de venta libre o recetados, segn las indicaciones del mdico.  Concurra a las consultas de control con su mdico segn las indicaciones.  Hgase exmenes plvicos regulares y pruebas de Papanicolaou. SOLICITE ATENCIN MDICA SI:   Los perodos se atrasan, son irregulares, dolorosos o cesan.  El dolor plvico o abdominal no desaparece.  El abdomen se agranda o se hincha.  Siente presin en la vejiga o no puede vaciarla  completamente.  Siente dolor durante las relaciones sexuales.  Tiene una sensacin de hinchazn, presin o molestias en el estmago.  Pierde peso sin razn aparente.  Siente un malestar generalizado.  Est estreida.  Pierde el apetito.  Le aparece acn.  Nota un aumento del vello corporal y facial.  Aumenta de peso sin hacer modificaciones en su actividad fsica y en su dieta habitual.  Sospecha que est embarazada. SOLICITE ATENCIN MDICA DE INMEDIATO SI:   Siente cada vez ms dolor abdominal.  Tiene malestar estomacal (nuseas) y vomita.  Tiene fiebre que se presenta de manera repentina.  Siente dolor abdominal al defecar.  Sus perodos menstruales son ms abundantes que lo habitual. Document Released: 01/01/2005 Document Revised: 01/12/2013 ExitCare Patient Information 2014 ExitCare, LLC.  

## 2013-08-17 NOTE — Progress Notes (Signed)
   Patient presented to the office today for one month followup after having placed a Mirena IUD. Since it was a little bit discomforting placing the IUD we asked her to come in for an ultrasound today as well to confirm position.  Patient with strong family history of breast cancer as follows:  Mother diagnosed with breast cancer at the age of 2 recently deceased  19 diagnosed with breast cancer at the age of 33 One of her sisters was tested recently and was negative for the BRCA1 and BRCA2 gene mutation. Patient is scheduled for later today for testing as well.  Patient has no complaints since the IUD was placed. Ultrasound today as follows: Uterus measured 8.8 x 6.0 x 4.0 cm with endometrial stripe of 2.4 mm. IUD was seen in the normal position. Right ovary was normal. Left ovary with a thin-walled septated cystic mass measuring 44 x 36 x 27 mm average size 36 mm. Secondary to free cyst measuring 32 x 8 x 14 mm. Both were avascular. No free fluid in the cul-de-sac.  Assessment/plan: Patient one month status post placement right IUD doing well. Ultrasound incidental finding of 2 right ovarian cyst which are avascular and thin-walled. Because of family history of breast cancer and unknown BRCA1 and BRCA2 status of this patient we're going to obtain a CA 125 today with its limitations discussed with the patient. She will return back in 3 months for a followup ultrasound. Literature  Information on both subjects provided today.

## 2013-08-17 NOTE — Progress Notes (Signed)
Patient Name: [name] Patient Age: 33 y.o. Encounter Date: 08/17/2013  Referring Physician: Uvaldo Rising, MD  Primary Care Provider: Leeanne Rio, PA-C   [name], a 33 y.o. female, is being seen at the [facility] due to a family history of breast cancer.  She presents to clinic today with her sister, [name], to discuss the possibility of a hereditary predisposition to cancer and discuss whether genetic testing is warranted.  HISTORY OF PRESENT ILLNESS: [name] has no personal history of cancer and is in generally good health. She reports having a yearly gynecologic exam.   [name] sister was seen by Roma Kayser for genetic counseling on 06/28/13. At that time, Santiago Glad recommended genetic testing of 21 genes through [facility]. No clearly pathogenic mutations were identified in [name] sister. 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 This is not a clinically actionable result.  Past Surgical History  Procedure Laterality Date  . Cesarean section      History   Social History  . Marital Status: Married    Spouse Name: N/A    Number of Children: N/A  . Years of Education: N/A   Social History Main Topics  . Smoking status: Light Tobacco Smoker  . Smokeless tobacco: Never Used  . Alcohol Use: Yes     Comment: socially  . Drug Use: No  . Sexual Activity: Yes     Comment: MIRENA   Other Topics Concern  . Not on file   Social History Narrative  . No narrative on file     FAMILY HISTORY:   During the visit, a 4-generation pedigree was obtained. Significant diagnoses include the following:  Family History  Problem Relation Age of Onset  . Breast cancer Mother 30    deceased 94  . Hypertension Father   . Breast cancer Maternal Grandmother 42    deceased 54s    Additionally, [name] has 3 other maternal aunt and 2 other maternal uncles who are all cancer-free as  are all of her first cousins. Her father is 37 and cancer-free and there are no paternal relatives with any cancers.  [name] ancestry is [place]. There is no known Jewish ancestry and no consanguinity.  ASSESSMENT AND PLAN: [name] is a 33 y.o. female with a family history of breast cancer in her mother at age 92 and her maternal grandmother at age 15. This history is somewhat suggestive of a hereditary predisposition to cancer, specifically BRCA1 or BRCA2, but she has a very large maternal family with no other history of cancer. We reviewed the characteristics, features and inheritance patterns of hereditary cancer syndromes. We also discussed genetic testing, including other appropriate family members to test, the process of testing, insurance coverage and implications of results. A negative result will be reassuring in [name], but she understood that her own risk of breast cancer would still remain elevated above general population risk due to her family history. We recommend she initiate mammographic screening around age 63, which is 86 years prior to the youngest diagnosis of breast cancer in the family.  We spent a great deal of time discussing testing options including analysis of the BRCA1/BRCA2 genes only, versus a broader panel with moderately penetrant genes like the one her sister had. We discussed that the more genes are analyzed, the higher the likelihood is of finding a Variant of Unknown Significance (VUS) like  was seen in her sister. [name] indicated she tends to get anxious about information that is unclear or uncertain and that a VUS would cause her undue anxiety. For this reason, we recommended BRCA1 and BRCA2 analysis only.  [name] wished to pursue genetic testing and a blood sample will be sent to [facility] for analysis of the BRCA1 and BRCA2 genes. We discussed the implications of a positive, negative and/ or VUS result. Results should be available in  approximately 2-3 weeks, at which point we will contact her and address implications for her as well as address genetic testing for at-risk family members, if needed.    We encouraged [name] to remain in contact with Cancer Genetics annually so that we can update the family history and inform her of any changes in cancer genetics and testing that may be of benefit for this family. Ms.  Stephenson Stephenson were answered to her satisfaction today.   Thank you for the referral and allowing [place] to share in the care of your patient.   The patient was seen for a total of 30 minutes, greater than 50% of which was spent face-to-face counseling. This patient was discussed with the referring provider who agrees with the above.

## 2013-08-18 LAB — CA 125: CA 125: 7.6 U/mL (ref 0.0–30.2)

## 2013-09-01 ENCOUNTER — Encounter: Payer: Self-pay | Admitting: Genetic Counselor

## 2013-09-01 NOTE — Progress Notes (Signed)
Referring Physician: Uvaldo Rising, MD   [name] was called today to discuss genetic test results. Please see the Genetics note from her visit on 08/17/13.  GENETIC TESTING: At the time of [name]'s visit, we recommended she pursue genetic testing of the BRCA1 and BRCA2 genes. This test, which included sequencing and deletion/duplication analysis, was performed at [facility]. Testing was normal and did not reveal a mutation in these genes.   We discussed with [name] that since the current test is not perfect, it is possible there may be a gene mutation that current testing cannot detect, but that chance is small.  We also discussed that it is possible that a different genetic factor, which has not yet been discovered, is responsible for the cancer diagnoses in the family.   ADDITIONAL GENETIC TESTING: We discussed with [name] that there are other genes that are associated with increased cancer risk that can be analyzed. The laboratories that offer such testing look at these additional genes via a hereditary cancer gene panel. Should [name] wish to discuss this additional testing with [place] or pursue such testing, we are happy to coordinate this at any time, but do not feel that she is at significant risk of harboring a mutation in a different gene.     CANCER SCREENING: This normal result is reassuring and indicates that [name] does not likely have an increased risk of cancer due to a mutation in one of these genes. We recommended [name] continue to follow the cancer screening guidelines provided by her primary physician. We discussed that initiating mammograms at age 24 is reasonable as it is 72 years younger than the youngest breast cancer in the family.  FAMILY MEMBERS: Women in the family are also at some increased risk of developing breast cancer, over the general population risk, simply due to the family history. We recommended they have a yearly mammogram beginning at age 38,  a yearly clinical breast exam, and perform monthly breast self-exams. A gynecologic exam is recommended yearly. Colon cancer screening is recommended to begin by age 55.    Lastly, we discussed with [name] that cancer genetics is a rapidly advancing field and it is possible that new genetic tests will be appropriate for her in the future. We encouraged her to remain in contact with [place] on an annual basis so we can update her personal and family histories, and let her know of advances in cancer genetics that may benefit the family. Our contact number was provided. [name] questions were answered to her satisfaction today, and she knows she is welcome to call anytime with additional questions.    Steele Berg, MS, [facility] Certified Genetic Counseor phone: [phone] [email or web address]

## 2013-11-28 ENCOUNTER — Ambulatory Visit (INDEPENDENT_AMBULATORY_CARE_PROVIDER_SITE_OTHER): Payer: 59

## 2013-11-28 ENCOUNTER — Ambulatory Visit (INDEPENDENT_AMBULATORY_CARE_PROVIDER_SITE_OTHER): Payer: 59 | Admitting: Gynecology

## 2013-11-28 DIAGNOSIS — N83202 Unspecified ovarian cyst, left side: Secondary | ICD-10-CM

## 2013-11-28 DIAGNOSIS — N949 Unspecified condition associated with female genital organs and menstrual cycle: Secondary | ICD-10-CM

## 2013-11-28 DIAGNOSIS — R102 Pelvic and perineal pain: Secondary | ICD-10-CM

## 2013-11-28 DIAGNOSIS — N83209 Unspecified ovarian cyst, unspecified side: Secondary | ICD-10-CM

## 2013-11-28 DIAGNOSIS — Z803 Family history of malignant neoplasm of breast: Secondary | ICD-10-CM

## 2013-11-28 NOTE — Progress Notes (Signed)
   The patient presented to the office today for ultrasound followup. Patient in April of this year had a Mirena IUD placed. She was complaining of abdominal pelvic discomfort an ultrasound had been done which demonstrated the following:  Patient has no complaints since the IUD was placed. Ultrasound today as follows:  Uterus measured 8.8 x 6.0 x 4.0 cm with endometrial stripe of 2.4 mm. IUD was seen in the normal position. Right ovary was normal. Left ovary with a thin-walled septated cystic mass measuring 44 x 36 x 27 mm average size 36 mm. Secondary to free cyst measuring 32 x 8 x 14 mm. Both were avascular. No free fluid in the cul-de-sac.  A CA 125 was done which was within normal range.  Ultrasound today: Uterus measures 7.6 x 5.5 x 3.6 cm with endometrial stripe at 2.5 mm. Right ovary with a thin wall echo free cyst measuring 33 x 20 x 34 mm average size 29 mm and totally avascular. Left ovary was normal. Previous right ovarian cyst not seen. No fluid in the cul-de-sac.  Assessment/plan: These appeared to be physiological cyst which changes location from the ovary to ovary since she is on a Mirena IUD. I have explained this to the patient. She is interested in changing her IUD to a nonhormonal IUD such as the ParaGard T380A for which literature information was provided. She was scheduled for later next month.

## 2013-12-06 ENCOUNTER — Other Ambulatory Visit: Payer: Self-pay | Admitting: Gynecology

## 2013-12-06 ENCOUNTER — Telehealth: Payer: Self-pay | Admitting: Gynecology

## 2013-12-06 NOTE — Telephone Encounter (Signed)
12/06/13-Claudia spoke with this pt in Spanish to let her know that her [place] insurance will cover the removal of existing Mirena and insertion of new Paraguard IUD at 100%, no copay. Appt was made with JF/wl

## 2013-12-28 ENCOUNTER — Ambulatory Visit (INDEPENDENT_AMBULATORY_CARE_PROVIDER_SITE_OTHER): Payer: 59 | Admitting: Gynecology

## 2013-12-28 ENCOUNTER — Encounter: Payer: Self-pay | Admitting: Gynecology

## 2013-12-28 VITALS — BP 124/80

## 2013-12-28 DIAGNOSIS — Z8742 Personal history of other diseases of the female genital tract: Secondary | ICD-10-CM

## 2013-12-28 DIAGNOSIS — Z30433 Encounter for removal and reinsertion of intrauterine contraceptive device: Secondary | ICD-10-CM

## 2013-12-28 DIAGNOSIS — Z975 Presence of (intrauterine) contraceptive device: Secondary | ICD-10-CM | POA: Insufficient documentation

## 2013-12-28 NOTE — Patient Instructions (Signed)
Intrauterine Device Insertion Most often, an intrauterine device (IUD) is inserted into the uterus to prevent pregnancy. There are 2 types of IUDs available:  Copper IUD--This type of IUD creates an environment that is not favorable to sperm survival. The mechanism of action of the copper IUD is not known for certain. It can stay in place for 10 years.  Hormone IUD--This type of IUD contains the hormone progestin (synthetic progesterone). The progestin thickens the cervical mucus and prevents sperm from entering the uterus, and it also thins the uterine lining. There is no evidence that the hormone IUD prevents implantation. One hormone IUD can stay in place for up to 5 years, and a different hormone IUD can stay in place for up to 3 years. An IUD is the most cost-effective birth control if left in place for the full duration. It may be removed at any time. LET YOUR HEALTH CARE PROVIDER KNOW ABOUT:  Any allergies you have.  All medicines you are taking, including vitamins, herbs, eye drops, creams, and over-the-counter medicines.  Previous problems you or members of your family have had with the use of anesthetics.  Any blood disorders you have.  Previous surgeries you have had.  Possibility of pregnancy.  Medical conditions you have. RISKS AND COMPLICATIONS  Generally, intrauterine device insertion is a safe procedure. However, as with any procedure, complications can occur. Possible complications include:  Accidental puncture (perforation) of the uterus.  Accidental placement of the IUD either in the muscle layer of the uterus (myometrium) or outside the uterus. If this happens, the IUD can be found essentially floating around the bowels and must be taken out surgically.  The IUD may fall out of the uterus (expulsion). This is more common in women who have recently had a child.   Pregnancy in the fallopian tube (ectopic).  Pelvic inflammatory disease (PID), which is infection of  the uterus and fallopian tubes. The risk of PID is slightly increased in the first 20 days after the IUD is placed, but the overall risk is still very low. BEFORE THE PROCEDURE  Schedule the IUD insertion for when you will have your menstrual period or right after, to make sure you are not pregnant. Placement of the IUD is better tolerated shortly after a menstrual cycle.  You may need to take tests or be examined to make sure you are not pregnant.  You may be required to take a pregnancy test.  You may be required to get checked for sexually transmitted infections (STIs) prior to placement. Placing an IUD in someone who has an infection can make the infection worse.  You may be given a pain reliever to take 1 or 2 hours before the procedure.  An exam will be performed to determine the size and position of your uterus.  Ask your health care provider about changing or stopping your regular medicines. PROCEDURE   A tool (speculum) is placed in the vagina. This allows your health care provider to see the lower part of the uterus (cervix).  The cervix is prepped with a medicine that lowers the risk of infection.  You may be given a medicine to numb each side of the cervix (intracervical or paracervical block). This is used to block and control any discomfort with insertion.  A tool (uterine sound) is inserted into the uterus to determine the length of the uterine cavity and the direction the uterus may be tilted.  A slim instrument (IUD inserter) is inserted through the cervical   canal and into your uterus.  The IUD is placed in the uterine cavity and the insertion device is removed.  The nylon string that is attached to the IUD and used for eventual IUD removal is trimmed. It is trimmed so that it lays high in the vagina, just outside the cervix. AFTER THE PROCEDURE  You may have bleeding after the procedure. This is normal. It varies from light spotting for a few days to menstrual-like  bleeding.  You may have mild cramping. Document Released: 11/20/2010 Document Revised: 01/12/2013 Document Reviewed: 09/12/2012 ExitCare Patient Information 2015 ExitCare, LLC. This information is not intended to replace advice given to you by your health care provider. Make sure you discuss any questions you have with your health care provider.  

## 2013-12-28 NOTE — Progress Notes (Signed)
The patient presented to the office today to remove her Mirena IUD and exchange for a ParaGard T380A IUD. Patient's history as follows:  Patient in April of this year had a Mirena IUD placed. She was complaining of abdominal pelvic discomfort an ultrasound had been done which demonstrated the following:  Ultrasound 07/29/2013 as follows: Uterus measured 8.8 x 6.0 x 4.0 cm with endometrial stripe of 2.4 mm. IUD was seen in the normal position. Right ovary was normal. Left ovary with a thin-walled septated cystic mass measuring 44 x 36 x 27 mm average size 36 mm. Secondary to free cyst measuring 32 x 8 x 14 mm. Both were avascular. No free fluid in the cul-de-sac A CA 125 was done which was within normal range  Followup ultrasound 11/28/2013 as follows: Ultrasound today:  Uterus measures 7.6 x 5.5 x 3.6 cm with endometrial stripe at 2.5 mm. Right ovary with a thin wall echo free cyst measuring 33 x 20 x 34 mm average size 29 mm and totally avascular. Left ovary was normal. Previous right ovarian cyst not seen. No fluid in the cul-de-sac.  It was assumed that the cyst with physiological as a result of the bleeding this is the reason for today to switch to the hormonal IUD such as the ParaGard T380A for which patient provided literature and information.                                                                                                                                         IUD procedure note       Patient presented to the office today for placement of ParaGard T380A IUD. The patient had previously been provided with literature information on this method of contraception. The risks benefits and pros and cons were discussed and all her questions were answered. She is fully aware that this form of contraception is 99% effective and is good for 10 years.  Pelvic exam: Bartholin urethra Skene glands: Within normal limits Vagina: No lesions or discharge Cervix: No lesions or  discharge Uterus: Anteverted position Adnexa: No masses or tenderness Rectal exam: Not done  The cervix was cleansed with Betadine solution. A single-tooth tenaculum was placed on the anterior cervical lip. The uterus sounded to 7 centimeter. The IUD was shown to the patient and inserted in a sterile fashion. The IUD string was trimmed. The single-tooth tenaculum was removed. Patient was instructed to return back to the office in one month for follow up.   We will also do an ultrasound to followup in the small right ovarian cyst.   Lot number: [id number]

## 2014-01-03 ENCOUNTER — Encounter: Payer: Self-pay | Admitting: Gynecology

## 2014-01-25 ENCOUNTER — Ambulatory Visit: Payer: 59 | Admitting: Gynecology

## 2014-01-30 ENCOUNTER — Other Ambulatory Visit: Payer: 59

## 2014-01-30 ENCOUNTER — Ambulatory Visit: Payer: 59 | Admitting: Gynecology

## 2014-02-06 ENCOUNTER — Encounter: Payer: Self-pay | Admitting: Gynecology

## 2014-02-06 ENCOUNTER — Ambulatory Visit: Payer: 59 | Admitting: Gynecology

## 2014-02-06 ENCOUNTER — Other Ambulatory Visit: Payer: 59

## 2014-02-24 ENCOUNTER — Other Ambulatory Visit: Payer: 59

## 2014-02-24 ENCOUNTER — Ambulatory Visit: Payer: 59 | Admitting: Gynecology

## 2014-03-13 ENCOUNTER — Ambulatory Visit (INDEPENDENT_AMBULATORY_CARE_PROVIDER_SITE_OTHER): Payer: 59 | Admitting: Gynecology

## 2014-03-13 ENCOUNTER — Ambulatory Visit (INDEPENDENT_AMBULATORY_CARE_PROVIDER_SITE_OTHER): Payer: 59

## 2014-03-13 ENCOUNTER — Encounter: Payer: Self-pay | Admitting: Gynecology

## 2014-03-13 ENCOUNTER — Other Ambulatory Visit: Payer: Self-pay | Admitting: Gynecology

## 2014-03-13 VITALS — BP 114/72

## 2014-03-13 DIAGNOSIS — Z975 Presence of (intrauterine) contraceptive device: Secondary | ICD-10-CM

## 2014-03-13 DIAGNOSIS — N831 Corpus luteum cyst of ovary, unspecified side: Secondary | ICD-10-CM

## 2014-03-13 DIAGNOSIS — Z8742 Personal history of other diseases of the female genital tract: Secondary | ICD-10-CM

## 2014-03-13 DIAGNOSIS — N8320 Unspecified ovarian cysts: Secondary | ICD-10-CM

## 2014-03-13 DIAGNOSIS — N946 Dysmenorrhea, unspecified: Secondary | ICD-10-CM

## 2014-03-13 DIAGNOSIS — N920 Excessive and frequent menstruation with regular cycle: Secondary | ICD-10-CM

## 2014-03-13 DIAGNOSIS — N83201 Unspecified ovarian cyst, right side: Secondary | ICD-10-CM

## 2014-03-13 DIAGNOSIS — Z23 Encounter for immunization: Secondary | ICD-10-CM

## 2014-03-13 MED ORDER — TRANEXAMIC ACID 650 MG PO TABS: 1300.0000 mg | ORAL_TABLET | Freq: Three times a day (TID) | ORAL | Status: DC

## 2014-03-13 NOTE — Progress Notes (Signed)
   Patient presented to the office today for follow-up on ovarian cyst. She is otherwise doing well. She does have dysmenorrhea and heavy cycles now that she has the nonhormonal IUD. Her history and review is as follows:   On 12/28/2013 she was seen in the office and had her Mirena IUD and was replaced with a nonhormonal ParaGard T380A IUD.  Prior to that in April of this year demonstrated the following:   Ultrasound 07/29/2013 as follows: Uterus measured 8.8 x 6.0 x 4.0 cm with endometrial stripe of 2.4 mm. IUD was seen in the normal position. Right ovary was normal. Left ovary with a thin-walled septated cystic mass measuring 44 x 36 x 27 mm average size 36 mm. Secondary to free cyst measuring 32 x 8 x 14 mm. Both were avascular. No free fluid in the cul-de-sac A CA 125 was done which was within normal range  Followup ultrasound 11/28/2013 as follows: Ultrasound today:  Uterus measures 7.6 x 5.5 x 3.6 cm with endometrial stripe at 2.5 mm. Right ovary with a thin wall echo free cyst measuring 33 x 20 x 34 mm average size 29 mm and totally avascular. Left ovary was normal. Previous right ovarian cyst not seen. No fluid in the cul-de-sac.  Ultrasound today the summer 7 2015: Uterus measured 8.3 x 5.5 x 3.2 cm endometrial stripe of 6.8 mm. Patient with a 15 of her cycle. IUD was seen in the normal position. C-section scar was noted. Right thick wall cyst with internal low level echoes positive color flow in the periphery measuring 18 x 16 mm. Left ovary normal. No fluid in the cul-de-sac.   Assessment/plan: #1 prior history of 2 cystic left ovary completely resolved. Small corpus luteum cyst like was noted on the right ovary. IUD in normal position. #2 for patient's menorrhagia dysmenorrhea she will be prescribed Lysteda 650 mg 2 tablets 3 times a day. #3 patient to return next year for her annual exam or when necessary #4 patient received her flu vaccine today.

## 2014-03-13 NOTE — Addendum Note (Signed)
Addended by: Berna SpareASTILLO, Evianna Chandran A on: 03/13/2014 10:57 AM   Modules accepted: Orders

## 2014-03-13 NOTE — Patient Instructions (Signed)
Influenza Virus Vaccine injection (Fluarix) Qu es este medicamento? La VACUNA ANTIGRIPAL ayuda a disminuir el riesgo de contraer la influenza, tambin conocida como la gripe. La vacuna solo ayuda a protegerle contra algunas cepas de influenza. Esta vacuna no ayuda a reducir Catering manager de contraer influenza pandmica H1N1. Este medicamento puede ser utilizado para otros usos; si tiene alguna pregunta consulte con su proveedor de atencin mdica o con su farmacutico. MARCAS COMERCIALES DISPONIBLES: Fluarix, Fluzone Qu le debo informar a mi profesional de la salud antes de tomar este medicamento? Necesita saber si usted presenta alguno de los siguientes problemas o situaciones: -trastorno de sangrado como hemofilia -fiebre o infeccin -sndrome de Guillain-Barre u otros problemas neurolgicos -problemas del sistema inmunolgico -infeccin por el virus de la inmunodeficiencia humana (VIH) o SIDA -niveles bajos de plaquetas en la sangre -esclerosis mltiple -una Risk analyst o inusual a las vacunas antigripales, a los huevos, protenas de pollo, al ltex, a la gentamicina, a otros medicamentos, alimentos, colorantes o conservantes -si est embarazada o buscando quedar embarazada -si est amamantando a un beb Cmo debo utilizar este medicamento? Esta vacuna se administra mediante inyeccin por va intramuscular. Lo administra un profesional de [employer]	[place]. Recibir una copia de informacin escrita sobre la vacuna antes de cada vacuna. Asegrese de leer este folleto cada vez cuidadosamente. Este folleto puede cambiar con frecuencia. Hable con su pediatra para informarse acerca del uso de este medicamento en nios. Puede requerir atencin especial. Sobredosis: Pngase en contacto inmediatamente con un centro toxicolgico o una sala de urgencia si usted cree que haya tomado demasiado medicamento. ATENCIN: [employer] es solo para usted. No comparta este medicamento con nadie. Qu sucede  si me olvido de una dosis? No se aplica en este caso. Qu puede interactuar con este medicamento? -quimioterapia o radioterapia -medicamentos que suprimen el sistema inmunolgico, tales como etanercept, anakinra, infliximab y adalimumab -medicamentos que tratan o previenen cogulos sanguneos, como warfarina -fenitona -medicamentos esteroideos, como la prednisona o la cortisona -teofilina -vacunas Puede ser que esta lista no menciona todas las posibles interacciones. Informe a su profesional de KB Home	[place] de [employer] productos a base de hierbas, medicamentos de [place] o suplementos nutritivos que est tomando. Si usted fuma, consume bebidas alcohlicas o si utiliza drogas ilegales, indqueselo tambin a su profesional de KB Home	[place]. Algunas sustancias pueden interactuar con su medicamento. A qu debo estar atento al usar [employer]? Informe a su mdico o a Barrister's clerk de la [employer] todos los efectos secundarios que persistan despus de 3 das. Llame a su proveedor de atencin mdica si se presentan sntomas inusuales dentro de las 6 semanas posteriores a la vacunacin. Es posible que todava pueda contraer la gripe, pero la enfermedad no ser tan fuerte como normalmente. No puede contraer la gripe de esta vacuna. La vacuna antigripal no le protege contra resfros u otras enfermedades que pueden causar [place]. Debe vacunarse cada ao. Qu efectos secundarios puedo tener al [employer] este medicamento? Efectos secundarios que debe informar a su mdico o a Barrister's clerk de la salud tan pronto como sea posible: -reacciones alrgicas como erupcin cutnea, picazn o urticarias, hinchazn de la cara, labios o lengua Efectos secundarios que, por lo general, no requieren atencin mdica (debe informarlos a su mdico o a su profesional de la salud si persisten o si son molestos): -fiebre -dolor de cabeza -molestias y dolores musculares -dolor, sensibilidad, enrojecimiento o Database administrator de la inyeccin -cansancio o debilidad Puede ser que [employer]  no menciona todos los posibles efectos secundarios. Comunquese a su mdico por asesoramiento mdico Hewlett-Packardsobre los efectos secundarios. Usted puede informar los efectos secundarios a la FDA por telfono al 1-800-FDA-1088. Dnde debo guardar mi medicina? Esta vacuna se administra solamente en clnicas, farmacias, consultorio mdico u otro consultorio de un profesional de la salud y no Teacher, early years/prenecesitar guardarlo en su domicilio. ATENCIN: Este folleto es un resumen. Puede ser que no cubra toda la posible informacin. Si usted tiene preguntas acerca de esta medicina, consulte con su mdico, su farmacutico o su profesional de Radiographer, therapeuticla salud.  2015, Elsevier/Gold Standard. (2009-09-25 15:31:40) Tranexamic acid oral tablets Qu es este medicamento? El CIDO Edison InternationalRANEXMICO reduce o detiene el proceso mediante el cual los cogulos sanguneos se descomponen. Este medicamento se [place] Islandsutiliza para tratar los Bank of Americasangrados menstruales abundante. Este medicamento puede ser utilizado para otros usos; si tiene alguna pregunta consulte con su proveedor de atencin mdica o con su farmacutico. MARCAS COMERCIALES DISPONIBLES: [place], Lysteda Qu le debo informar a mi profesional de la salud antes de tomar este medicamento? Necesita saber si usted presenta alguno de los siguientes problemas o situaciones: -sangrado en el cerebro -problemas de coagulacin -enfermedad renal -problemas de visin -una reaccin alrgica o inusual al cido tranexmico, a otros medicamentos, alimentos, colorantes o conservantes -si est embarazada o buscando quedar embarazada -si est amamantando a un beb Cmo debo utilizar este medicamento? Tome este medicamento por va oral con un vaso de agua. Siga las instrucciones de la etiqueta del Cla[place]medicamento. No corte, triture ni mstique este medicamento. Puede tomarlo con o sin alimentos. Si le produce malestar estomacal, tmelo con  alimentos. Tome sus dosis a intervalos regulares. No tome su medicamento con una frecuencia mayor a la indicada. No deje de tomarlo excepto si as lo indica su mdico. No tome este medicamento hasta que empieza su perodo. No lo tome durante ms de 5 das en seguidos. No tome este medicamento mientras no tiene su perodo. Hable con su pediatra para informarse acerca del uso de este medicamento en nios. Aunque este medicamento ha sido recetado a nias tan menores como de 12 aos de edad para condiciones selectivas, las precauciones se aplican. Sobredosis: Pngase en contacto inmediatamente con un centro toxicolgico o una sala de urgencia si usted cree que haya tomado demasiado medicamento. ATENCIN: Reynolds AmericanEste medicamento es solo para usted. No comparta este medicamento con nadie. Qu sucede si me olvido de una dosis? Si olvida una dosis, tmela cuando se recuerde y tome la prxima dosis por lo menos 6 horas despus. No tome ms de 2 tabletas a la vez para compensar las dosis olvidadas. Qu puede interactuar con este medicamento? No tome esta medicina con ninguno de los siguientes medicamentos: -hormonas femeninas, como estrgenos o progestinas y pldoras, parches, anillos o inyecciones anticonceptivas Esta medicina tambin puede Product/process development scientistinteractuar con los siguientes medicamentos: -ciertos medicamentos usados para ayudar a que la sangre coagule o romper los cogulos de sangre -ciertos medicamentos usados para tratar la leucemia Puede ser que esta lista no menciona todas las posibles interacciones. Informe a su profesional de Beazer Homesla salud de Ingram Micro Inctodos los productos a base de hierbas, medicamentos de [place]vent[place] libre o suplementos nutritivos que est tomando. Si usted fuma, consume bebidas alcohlicas o si utiliza drogas ilegales, indqueselo tambin a su profesional de Beazer Homesla salud. Algunas sustancias pueden interactuar con su medicamento. A qu debo estar atento al usar PPL Corporationeste medicamento? Informe a su mdico o su profesional de  la salud si sus sntomas no comienzan a mejorar o si empeoran.  Informe a su mdico o su profesional de la salud si observa cualquier problema con los ojos mientras recibe PPL Corporationeste medicamento. Su mdico le referir a Games developerun mdico de los ojos que Textron Incle examinar los ojos. Qu efectos secundarios puedo tener al Boston Scientificutilizar este medicamento? Efectos secundarios que debe informar a su mdico o a Producer, television/film/videosu profesional de la salud tan pronto como sea posible: -Therapist, artreacciones alrgicas como erupcin cutnea, picazn o urticarias, hinchazn de la cara, labios o lengua -problemas respiratorios -cambios en la visin -dolor repentino o grave en el pecho, piernas, cabeza o ingle -cansancio o debilidad inusual Efectos secundarios que, por lo general, no requieren atencin mdica (debe informarlos a su mdico o a su profesional de la salud si persisten o si son molestos): -dolor de espalda -dolor de cabeza -dolores musculares o articulares -problemas nasales o sinusales -dolor estomacal -cansancio Puede ser que esta lista no menciona todos los posibles efectos secundarios. Comunquese a su mdico por asesoramiento mdico Hewlett-Packardsobre los efectos secundarios. Usted puede informar los efectos secundarios a la FDA por telfono al 1-800-FDA-1088. Dnde debo guardar mi medicina? Mantngala fuera del alcance de los nios. Gurdela a Sanmina-SCItemperatura ambiente, entre 15 y 30 grados C (7959 y 686 grados F). Deseche todo el medicamento que no haya utilizado, despus de la fecha de vencimiento. ATENCIN: Este folleto es un resumen. Puede ser que no cubra toda la posible informacin. Si usted tiene preguntas acerca de esta medicina, consulte con su mdico, su farmacutico o su profesional de Radiographer, therapeuticla salud.  2015, Elsevier/Gold Standard. (2012-01-21 15:07:37)

## 2014-03-20 ENCOUNTER — Ambulatory Visit (INDEPENDENT_AMBULATORY_CARE_PROVIDER_SITE_OTHER): Payer: 59 | Admitting: Emergency Medicine

## 2014-03-20 ENCOUNTER — Telehealth: Payer: Self-pay | Admitting: *Deleted

## 2014-03-20 VITALS — BP 110/70 | HR 64 | Temp 98.0°F | Resp 16 | Ht 62.25 in | Wt 149.6 lb

## 2014-03-20 DIAGNOSIS — S61219A Laceration without foreign body of unspecified finger without damage to nail, initial encounter: Secondary | ICD-10-CM

## 2014-03-20 DIAGNOSIS — M79645 Pain in left finger(s): Secondary | ICD-10-CM

## 2014-03-20 MED ORDER — IBUPROFEN 800 MG PO TABS: 800.0000 mg | ORAL_TABLET | Freq: Three times a day (TID) | ORAL | Status: DC | PRN

## 2014-03-20 MED ORDER — CEPHALEXIN 250 MG/5ML PO SUSR: 500.0000 mg | Freq: Three times a day (TID) | ORAL | Status: DC

## 2014-03-20 NOTE — Telephone Encounter (Signed)
Pt was prescribed lysteda 650 mg on OV 03/13/14 states she has trouble taking pills and she is not able to cut pills. Pt asked if she could have Ibuprofen 800 mg to help with menorrhagia dysmenorrhea, pt can cut these pills in half. Please advise

## 2014-03-20 NOTE — Progress Notes (Signed)
[facility] [address], [place] [id number] [phone]  Date:  03/20/2014   Name:  Jill NovemberOlga J Alejandro   DOB:  Jan 14, 1981   MRN:  [id number]  PCP:  Piedad ClimesMartin, William Cody, PA-C    Chief Complaint: Laceration   History of Present Illness:  Jill NovemberOlga J Zavadil is a 33 y.o. very pleasant female patient who presents with the following:  Patient was cutting food in the kitchen Saturday morning and cut her left fourth finger. She has been treating it with an antiseptic and keeping it covered She is current on tetanus Denies other complaint or health concern today.   Patient Active Problem List   Diagnosis Date Noted  . Dysmenorrhea 03/13/2014  . Menorrhagia with regular cycle 03/13/2014  . IUD (intrauterine device) in place 12/28/2013  . Family history of breast cancer in female 07/14/2013  . H. pylori infection 06/23/2013  . GERD (gastroesophageal reflux disease) 06/22/2013  . Visit for preventive health examination 06/22/2013  . ANEMIA, MILD 07/24/2008    History reviewed. No pertinent past medical history.  Past Surgical History  Procedure Laterality Date  . Cesarean section      History  Substance Use Topics  . Smoking status: Light Tobacco Smoker  . Smokeless tobacco: Never Used  . Alcohol Use: Yes     Comment: socially    Family History  Problem Relation Age of Onset  . Breast cancer Mother 1243    deceased 7358  . Hypertension Father   . Breast cancer Maternal Grandmother 45    deceased 2250s    No Known Allergies  Medication list has been reviewed and updated.  Current Outpatient Prescriptions on File Prior to Visit  Medication Sig Dispense Refill  . ibuprofen (ADVIL,MOTRIN) 800 MG tablet Take 1 tablet (800 mg total) by mouth every 8 (eight) hours as needed. 30 tablet 5  . tranexamic acid (LYSTEDA) 650 MG TABS tablet Take 2 tablets (1,300 mg total) by mouth 3 (three) times daily. (Patient not taking: Reported on 03/20/2014) 30 tablet 11    Current Facility-Administered Medications on File Prior to Visit  Medication Dose Route Frequency Provider Last Rate Last Dose  . levonorgestrel (MIRENA) 20 MCG/24HR IUD   Intrauterine Once Ok EdwardsJuan H Fernandez, MD        Review of Systems:  As per HPI, otherwise negative.    Physical Examination: Filed Vitals:   03/20/14 1848  BP: 110/70  Pulse: 64  Temp: 98 F (36.7 C)  Resp: 16   Filed Vitals:   03/20/14 1848  Height: 5' 2.25" (1.581 m)  Weight: 149 lb 9.6 oz (67.858 kg)   Body mass index is 27.15 kg/(m^2). Ideal Body Weight: Weight in (lb) to have BMI = 25: 137.5   GEN: WDWN, NAD, Non-toxic, Alert & Oriented x 3 HEENT: Atraumatic, Normocephalic.  Ears and Nose: No external deformity. EXTR: No clubbing/cyanosis/edema NEURO: Normal gait.  PSYCH: Normally interactive. Conversant. Not depressed or anxious appearing.  Calm demeanor.  Left hand:  1 cm laceration left fourth finger terminal phalange.  NATI.  No FB  Assessment and Plan: Laceration finger Pain finger  Signed,  Phillips OdorJeffery Micholas Drumwright, MD

## 2014-03-20 NOTE — Telephone Encounter (Signed)
Please call in prescription Motrin 800 mg 1 by mouth 3 times a day when necessary #30 with 5 refills

## 2014-03-20 NOTE — Patient Instructions (Signed)
Cuidados de una laceración - Adultos  °(Laceration Care, Adult) ° Una herida cortante es un corte o lesión que atraviesa todas las capas de la piel y el tejido que se encuentra debajo de la piel.  °TRATAMIENTO  °Algunas laceraciones no requieren sutura. Algunas no deben cerrarse debido a que puede aumentar el riesgo de infección. Es importante que consulte al médico lo antes posible después de recibir una lesión para minimizar el riesgo de infección y aumentar la posibilidad de que se cierre con éxito.  °Cuando se cierra adecuadamente, podrán indicarle analgésicos, si los necesita. La herida debe limpiarse para combatir la infección. El médico usará puntos (suturas), grapas,adhesivo, o tiras adhesivas para reparar la laceración. Estos elementos mantendrán unidos los bordes de la piel para que se cure más rápidamente y para un mejor resultado cosmético. Sin embargo, todas las heridas se curarán con una cicatriz. Una vez que la herida se haya curado, las cicatrices pueden minimizarse cubriendo la herida con pantalla solar durante el día por un lapso se 1 año.  °INSTRUCCIONES PARA EL CUIDADO EN EL HOGAR  °Si tiene puntos o grapas:  °· Mantenga la herida limpia y seca. °· Si tiene un (vendaje) cámbielo al menos una vez al día. Cámbielo si se moja o se ensucia, o según las indicaciones del médico. °· Lave el corte dos veces por día con agua y jabón. Enjuáguelo con agua para quitar todo el jabón. Seque dando palmaditas con una toalla limpia y seca. °· Después de limpiar, aplique una delgada capa de una crema con antibiótico según las indicaciones del médico. Esto le ayudará a prevenir las infecciones y a evitar que el vendaje se adhiera. °· Puede ducharse después de las primeras 24 horas. No remoje la herida en agua hasta que le hayan quitado los puntos. °· Solo tome medicamentos que se pueden comprar sin receta o recetados para el dolor, malestar o fiebre, como le indica el médico. °· Concurra para que le retiren los  puntos o las grapas cuando el médico le indique. °En caso que tenga tiras adhesivas:  °· Mantenga la herida limpia y seca. °· No deje que las tiras se mojen. Puede darse un baño cuidando de mantener la herida seca. °· Si se moja, séquela dando palmaditas con una toalla limpia. °· Las tiras caerán por sí mismas. Puede recortar las tiras a medida que la herida se cura. No quite las tiras que están pegadas a la herida. Ellas se caerán cuando sea el momento. °En caso que le hayan aplicado adhesivo.  °· Podrá mojara momentáneamente la herida en la ducha o el baño. No frote ni sumerja la herida. No practique natación. Evite transpirar con abundancia hasta que el adhesivo se haya caído. Después de ducharse o darse un baño, seque el corte dando palmaditas con una toalla limpia. °· No aplique medicamentos líquidos, en crema o ungüentos mientras el adhesivo esté en su lugar. Podrá aflojarlo antes de que la herida se cure. °· Si tiene un vendaje, tenga cuidado de no aplicar cinta adhesiva directamente sobre el adhesivo. Esto puede hacer que el adhesivo se caiga antes de que la herida se haya curado. °· Evite la exposición prolongada a la luz del sol o a la lámpara solar mientras en adhesivo se encuentre en el lugar. La exposición a los rayos ultravioletas durante el primer año oscurecerá la cicatriz. °· El adhesivo permanecerá sobre la piel durante 5 a 10 días y luego caerá naturalmente. No quite la película de adhesivo. °Deberá aplicarse   la vacuna contra el tétanos si: °· No recuerda cuándo se colocó la vacuna la última vez. °· Nunca recibió esta vacuna. °Si le han aplicado la vacuna contra el tétanos, el brazo podrá hincharse, enrojecer y sentirse caliente al tacto. Esto es frecuente y no es un problema. Si usted necesita aplicarse la vacuna y se niega a recibirla, corre riesgo de contraer tétanos. Ésta es una enfermedad grave.  °SOLICITE ATENCIÓN MÉDICA SI:  °· Presenta enrojecimiento, hinchazón o aumento del dolor en la  herida. °· Hay rayas rojas que salen de la herida. °· Observa un líquido blanco amarillento (pus) en la herida. °· Tiene fiebre. °· Advierte un olor fétido que proviene de la herida o del vendaje. °· La herida se abre luego de que le han extraído las suturas. °· Nota que en la herida hay algún cuerpo extraño como un trozo de madera o vidrio. °· La herida está en su mano o pie y observa que no puede mover correctamente los dedos. °SOLICITE ATENCIÓN MÉDICA DE INMEDIATO SI:  °· El dolor no se alivia con los medicamentos. °· Hay una zona muy hinchada alrededor de la herida que le causa dolor y adormecimiento, o advierte un cambio en el color en el brazo, la mano, la pierna o el pie. °· La herida se abre y sangra nuevamente. °· Siente que el adormecimiento, la debilidad o la pérdida de la función de la articulación que rodea la herida empeoran. °· Palpa nódulos dolorosos cerca de la herida o bajo la piel en cualquier zona del cuerpo. °ASEGÚRESE DE QUE:  °· Comprende estas instrucciones. °· Controlará su enfermedad. °· Solicitará ayuda de inmediato si no mejora o si empeora. °Document Released: 03/24/2005 Document Revised: 06/16/2011 °ExitCare® Patient Information ©2015 [employer]. This information is not intended to replace advice given to you by your health care provider. Make sure you discuss any questions you have with your health care provider. ° °

## 2014-03-20 NOTE — Telephone Encounter (Signed)
Pt informed, rx sent 

## 2014-03-28 ENCOUNTER — Ambulatory Visit (INDEPENDENT_AMBULATORY_CARE_PROVIDER_SITE_OTHER): Payer: 59 | Admitting: Physician Assistant

## 2014-03-28 VITALS — BP 122/74 | HR 81 | Temp 98.2°F | Resp 17 | Ht 62.5 in | Wt 148.0 lb

## 2014-03-28 DIAGNOSIS — S61219D Laceration without foreign body of unspecified finger without damage to nail, subsequent encounter: Secondary | ICD-10-CM

## 2014-03-28 NOTE — Progress Notes (Signed)
Pt here for suture removal. Sutures placed on 03/20/14. No signs of infection. Still a little painful but otherwise no problems.  No drainage or redness. 3 sutures removed without any problems. Advised pt to clean with soap and water.

## 2014-03-30 NOTE — Progress Notes (Signed)
   Subjective:    Patient ID: [name], female    DOB: July 29, 1980, 33 y.o.   MRN: [id number]  HPI  Pt here for suture removal.  She has had no problems with the wound.  Review of Systems     Objective:   Physical Exam  Well healed incision.  Sutures removed by CMA.    Assessment & Plan:  Laceration of finger, subsequent encounter  Benny LennertSarah Sayan Aldava PA-C  [facility] 03/30/2014 8:44 AM

## 2016-08-20 ENCOUNTER — Encounter: Payer: Self-pay | Admitting: Gynecology

## 2017-01-26 ENCOUNTER — Ambulatory Visit (INDEPENDENT_AMBULATORY_CARE_PROVIDER_SITE_OTHER): Payer: BLUE CROSS/BLUE SHIELD | Admitting: Obstetrics & Gynecology

## 2017-01-26 ENCOUNTER — Encounter: Payer: Self-pay | Admitting: Obstetrics & Gynecology

## 2017-01-26 ENCOUNTER — Telehealth: Payer: Self-pay | Admitting: *Deleted

## 2017-01-26 VITALS — BP 126/84 | Ht 63.0 in | Wt 167.0 lb

## 2017-01-26 DIAGNOSIS — Z803 Family history of malignant neoplasm of breast: Secondary | ICD-10-CM | POA: Diagnosis not present

## 2017-01-26 DIAGNOSIS — Z113 Encounter for screening for infections with a predominantly sexual mode of transmission: Secondary | ICD-10-CM

## 2017-01-26 DIAGNOSIS — N644 Mastodynia: Secondary | ICD-10-CM

## 2017-01-26 DIAGNOSIS — E663 Overweight: Secondary | ICD-10-CM

## 2017-01-26 DIAGNOSIS — Z01419 Encounter for gynecological examination (general) (routine) without abnormal findings: Secondary | ICD-10-CM | POA: Diagnosis not present

## 2017-01-26 DIAGNOSIS — T8332XA Displacement of intrauterine contraceptive device, initial encounter: Secondary | ICD-10-CM

## 2017-01-26 NOTE — Progress Notes (Signed)
[name] 09/03/1980 [id number]   History:    36 y.o. G1P1L1    RP:  Established patient presenting for annual gyn exam  HPI:  Menses normal every month.  Well on Paragard IUD x 12/2013.  No pelvic pain.  Normal secretions.  Breasts normal currently, occasional bumps coming and going in left breast.  Mother deceased from Breast Ca Dxed at 83 yo.  Patient had genetic testing which was negative.  Mictions/BMs wnl.  Overweight with BMI 29.58.  Struggling to loose weight, but very poor nutrition with Green tea/Honey and snacks.  Very little vegetables/proteins.  Works many hours in [employer].  Past medical history,surgical history, family history and social history were all reviewed and documented in the EPIC chart.  Gynecologic History No LMP recorded. Patient is not currently having periods (Reason: IUD). Contraception: IUD Last Pap: 06/2013. Results were: normal Last mammogram: Never.   Obstetric History OB History  Gravida Para Term Preterm AB Living  _0 SAB TAB Ectopic Multiple Live Births               # Outcome Date GA Lbr Len/2nd Weight Sex Delivery Anes PTL Lv  1 Para                ROS: A ROS was performed and pertinent positives and negatives are included in the history.  GENERAL: No fevers or chills. HEENT: No change in vision, no earache, sore throat or sinus congestion. NECK: No pain or stiffness. CARDIOVASCULAR: No chest pain or pressure. No palpitations. PULMONARY: No shortness of breath, cough or wheeze. GASTROINTESTINAL: No abdominal pain, nausea, vomiting or diarrhea, melena or bright red blood per rectum. GENITOURINARY: No urinary frequency, urgency, hesitancy or dysuria. MUSCULOSKELETAL: No joint or muscle pain, no back pain, no recent trauma. DERMATOLOGIC: No rash, no itching, no lesions. ENDOCRINE: No polyuria, polydipsia, no heat or cold intolerance. No recent change in weight. HEMATOLOGICAL: No anemia or easy bruising or bleeding.  NEUROLOGIC: No headache, seizures, numbness, tingling or weakness. PSYCHIATRIC: No depression, no loss of interest in normal activity or change in sleep pattern.     Exam:   BP 126/84   Ht 5' 3" (1.6 m)   Wt 167 lb (75.8 kg)   BMI 29.58 kg/m   Body mass index is 29.58 kg/m.  General appearance : Well developed well nourished female. No acute distress HEENT: Eyes: no retinal hemorrhage or exudates,  Neck supple, trachea midline, no carotid bruits, no thyroidmegaly Lungs: Clear to auscultation, no rhonchi or wheezes, or rib retractions  Heart: Regular rate and rhythm, no murmurs or gallops Breast:Examined in sitting and supine position were symmetrical in appearance, no palpable masses or tenderness,  no skin retraction, no nipple inversion, no nipple discharge, no skin discoloration, no axillary or supraclavicular lymphadenopathy Abdomen: no palpable masses or tenderness, no rebound or guarding Extremities: no edema or skin discoloration or tenderness  Pelvic: Vulva normal  Bartholin, Urethra, Skene Glands: Within normal limits             Vagina: No gross lesions or discharge  Cervix: No gross lesions or discharge.  Pap reflex/Gono-Chlam done  Uterus  AV, normal size, shape and consistency, non-tender and mobile  Adnexa  Without masses or tenderness  Anus and perineum  normal    Assessment/Plan:  36 y.o. female for annual exam   1. Encounter for routine gynecological examination with Papanicolaou smear of cervix  Gyn exam normal.  Pap reflex done.  Breasts wnl, but Left breast with probable Fibrocystic Breast Disease.   - CBC - Comp Met (CMET) - TSH - Vitamin D 1,25 dihydroxy - Lipid Profile  2. Intrauterine contraceptive device threads lost, initial encounter Paragard well tolerated, but strings not visible.  F/U Pelvic US to locate IUD. - US Transvaginal Non-OB; Future  3. Screen for STD (sexually transmitted disease) - Gono-Chlam on Pap - HIV antibody (with  reflex) - RPR - Hepatitis B Surface AntiGEN - Hepatitis C Antibody  4. Overweight (BMI 25.0-29.9) Difficulty loosing weight, but very poor nutrition.  Healthy nutrition with varied vegetables and protein intake discussed.  Regular physical activity reviewed.  R/O Hypothyroidism.   - TSH  5. Family history of breast cancer in mother Mother deceased, Dx of Cancer before menopause at 34 yo.  Patient's Genetic screening BrCa1-2 negative.   6. Breast pain, left Probable Left Breast Fibrocystic Disease.  Diagnosis and symptoms reviewed with patient.  Will do a Left Dx Mammo/US and a Right Screening Mammo given her Fam h/o Breast Ca.    Counseling on above issues >50% x 15 minutes.  Princess Bruins MD, 2:25 PM 01/26/2017

## 2017-01-26 NOTE — Telephone Encounter (Signed)
Patient scheduled at breast center on 01/29/17 2 1:50pm, claudia will inform patient .

## 2017-01-26 NOTE — Telephone Encounter (Signed)
-----  Message from Princess Bruins, MD sent at 01/26/2017  2:57 PM EDT ----- Regarding: Left Dx mammo/US, Right screening mammo Mother deceased from Breast Ca, Dx at 56.  Patient is BrCa1-2 neg.  Lt breast pain/Fibrocystic breast/Rt breast normal.

## 2017-01-27 LAB — PAP IG, CT-NG, RFX HPV ASCU
C. trachomatis RNA, TMA: NOT DETECTED
N. gonorrhoeae RNA, TMA: NOT DETECTED

## 2017-01-27 NOTE — Patient Instructions (Addendum)
1. Encounter for routine gynecological examination with Papanicolaou smear of cervix Gyn exam normal.  Pap reflex done.  Breasts wnl, but Left breast with probable Fibrocystic Breast Disease.   - CBC - Comp Met (CMET) - TSH - Vitamin D 1,25 dihydroxy - Lipid Profile  2. Intrauterine contraceptive device threads lost, initial encounter Paragard well tolerated, but strings not visible.  F/U Pelvic US to locate IUD. - US Transvaginal Non-OB; Future  3. Screen for STD (sexually transmitted disease) - Gono-Chlam on Pap - HIV antibody (with reflex) - RPR - Hepatitis B Surface AntiGEN - Hepatitis C Antibody  4. Overweight (BMI 25.0-29.9) Difficulty loosing weight, but very poor nutrition.  Healthy nutrition with varied vegetables and protein intake discussed.  Regular physical activity reviewed.  R/O Hypothyroidism.   - TSH  5. Family history of breast cancer in mother Mother deceased, Dx of Cancer before menopause at 34 yo.  Patient's Genetic screening BrCa1-2 negative.   6. Breast pain, left Probable Left Breast Fibrocystic Disease.  Diagnosis and symptoms reviewed with patient.  Will do a Left Dx Mammo/US and a Right Screening Mammo given her Fam h/o Breast Ca.    Carmela Rima un placer conocerla hoy!  Voy a informarla de Financial trader.  [place] (Health Maintenance, Female) Un estilo de vida saludable y los cuidados preventivos pueden favorecer considerablemente a la salud y Musician. Pregunte a su mdico cul es el cronograma de exmenes peridicos apropiado para usted. Esta es una buena oportunidad para consultarlo sobre cmo prevenir enfermedades y [place] sano. Adems de los controles, hay muchas otras cosas que puede hacer usted mismo. Los expertos han realizado numerosas investigaciones ArvinMeritor cambios en el estilo de vida y las medidas de prevencin que, [place], lo ayudarn a mantenerse sano. Solicite a su mdico ms  informacin. EL PESO Y LA DIETA Consuma una dieta saludable.  Asegrese de Family Dollar Stores verduras, frutas, productos lcteos de bajo contenido de [place] y Advertising account planner.  No consuma muchos alimentos de alto contenido de grasas slidas, azcares agregados o sal.  Realice actividad fsica con regularidad. Esta es una de las prcticas ms importantes que puede hacer por su salud. ? La Delorise Shiner de los adultos deben hacer ejercicio durante al menos 176mnutos por semana. El ejercicio debe aumentar la frecuencia cardaca y pActorla transpiracin (ejercicio de iDundee. ? La mayora de los adultos tambin deben hacer ejercicios de elongacin al mToysRusveces a la semana. Agregue esto al su plan de ejercicio de intensidad moderada. Mantenga un peso saludable.  El ndice de masa corporal ([facility] es una medida que puede utilizarse para identificar posibles problemas de pBloomfield Proporciona una estimacin de la grasa corporal basndose en el peso y la altura. Su mdico puede ayudarle a dRadiation protection practitionerIRavannay a lScientist, forensico mTheatre managerun peso saludable.  Para las mujeres de 20aos o ms: ? Un IWinter Haven Hospitalmenor de 18,5 se considera bajo peso. ? Un IDigestivecare Incentre 18,5 y 24,9 es normal. ? Un IBergan Mercy Surgery Center LLCentre 25 y 29,9 se considera sobrepeso. ? Un IMC de 30 o ms se considera obesidad. Observe los niveles de colesterol y lpidos en la sangre.  Debe comenzar a rEnglish as a second language teacherde lpidos y cResearch officer, trade unionen la sangre a los 20aos y luego repetirlos cada 547aos  Es posible que nAutomotive engineerlos niveles de colesterol con mayor frecuencia si: ? Sus niveles de lpidos y colesterol son altos. ? Es mayor de [id number] ? Presenta un  alto riesgo de Higher education careers adviser. DETECCIN DE CNCER Cncer de pulmn  Se recomienda realizar exmenes de deteccin de cncer de pulmn a personas adultas entre 26 y 38 aos que estn en riesgo de Horticulturist, commercial de pulmn por sus antecedentes de consumo de tabaco.  Se  recomienda una tomografa computarizada de baja dosis de los pulmones todos los aos a las personas que: ? Fuman actualmente. ? Hayan dejado el hbito en algn momento en los ltimos 15aos. ? Hayan fumado durante 30aos un paquete diario. Un paquete-ao equivale a fumar un promedio de un paquete de cigarrillos diario durante un ao.  Los exmenes de deteccin anuales deben continuar hasta que hayan pasado 15aos desde que dej de fumar.  Ya no debern realizarse si tiene un problema de salud que le impida recibir tratamiento para Science writer de pulmn. Cncer de mama  Practique la autoconciencia de la mama. Esto significa reconocer la apariencia normal de sus mamas y cmo las siente.  Tambin significa realizar autoexmenes regulares de Johnson [employer] Johnson. Informe a su mdico sobre cualquier cambio, sin importar cun pequeo sea.  Si tiene entre 20 y 13 aos, un mdico debe realizarle un examen clnico de las mamas como parte del examen regular de [place], cada 1 a 3aos.  Si tiene 40aos o ms, debe Information systems manager clnico de las Microsoft. Tambin considere realizarse una [place] ([place]) todos los [place].  Si tiene antecedentes familiares de cncer de mama, hable con su mdico para someterse a un estudio gentico.  Si tiene alto riesgo de Chief Financial Officer de mama, hable con su mdico para someterse a Public house manager y [employer].  La evaluacin del gen del cncer de mama (BRCA) se recomienda a mujeres que tengan familiares con cnceres relacionados con el BRCA. Los cnceres relacionados con el BRCA incluyen los siguientes: ? [place]. ? Ovario. ? Trompas. ? Cnceres de peritoneo.  Los resultados de la evaluacin determinarn la necesidad de asesoramiento gentico y de [place] de BRCA1 y BRCA2. Cncer de cuello del tero El mdico puede recomendarle que se haga pruebas peridicas de deteccin de cncer de los rganos de la pelvis  (ovarios, tero y vagina). Estas pruebas incluyen un examen plvico, que abarca controlar si se produjeron cambios microscpicos en la superficie del cuello del tero (prueba de Papanicolaou). Pueden recomendarle que se haga estas pruebas cada 3aos, a partir de los 21aos.  A las mujeres que tienen entre 30 y 67aos, los mdicos pueden recomendarles que se sometan a exmenes plvicos y pruebas de Papanicolaou cada 71aos, o a la prueba de Papanicolaou y el examen plvico en combinacin con estudios de deteccin del virus del papiloma humano (VPH) cada 5aos. Algunos tipos de VPH aumentan el riesgo de Chief Financial Officer de cuello del tero. La prueba para la deteccin del VPH tambin puede realizarse a mujeres de cualquier edad cuyos resultados de la prueba de Papanicolaou no sean claros.  Es posible que otros mdicos no recomienden exmenes de deteccin a mujeres no embarazadas que se consideran sujetos de bajo riesgo de Chief Financial Officer de pelvis y que no tienen sntomas. Pregntele al mdico si un examen plvico de deteccin es adecuado para usted.  Si ha recibido un tratamiento para Science writer cervical o una enfermedad que podra causar cncer, necesitar realizarse una prueba de Papanicolaou y controles durante al menos 36 aos de concluido el [place]. Si no se ha hecho el Papanicolaou con regularidad, debern volver a  evaluarse los factores de riesgo (como tener un nuevo compaero sexual), para determinar si debe realizarse los estudios nuevamente. Algunas mujeres sufren problemas mdicos que aumentan la probabilidad de Museum/gallery curator cncer de cuello del tero. En estos casos, el mdico podr QUALCOMM se realicen controles y pruebas de Papanicolaou con ms frecuencia. Cncer colorrectal  Este tipo de cncer puede detectarse y a menudo prevenirse.  Por lo general, los estudios de rutina se deben Medical laboratory scientific officer a Field seismologist a Proofreader de los 9 aos y McGregor 80 aos.  Sin embargo, el mdico podr aconsejarle  que lo haga antes, si tiene factores de riesgo para el cncer de colon.  Tambin puede recomendarle que use un kit de prueba para Hydrologist en la materia fecal.  Es posible que se use una pequea cmara en el extremo de un tubo para examinar directamente el colon (sigmoidoscopia o colonoscopia) a fin de Hydrographic surveyor formas tempranas de cncer colorrectal.  Los exmenes de rutina generalmente comienzan a los 38aos.  El examen directo del colon se debe repetir cada 5 a 10aos hasta los 75aos. Sin embargo, es posible que se realicen exmenes con mayor frecuencia, si se detectan formas tempranas de plipos precancerosos o pequeos bultos. Cncer de piel  Revise la piel de la cabeza a los pies con regularidad.  Informe a su mdico si aparecen nuevos lunares o los que tiene se modifican, especialmente en su forma y color.  Tambin notifique al mdico si tiene un lunar que es ms grande que el tamao de una goma de lpiz.  Siempre use pantalla solar. Aplique pantalla solar de Kerry Dory y repetida a lo largo del Training and development officer.  Protjase usando mangas y The Service[employer] Company, un sombrero de ala ancha y gafas para el sol, siempre que se encuentre en el exterior. ENFERMEDADES CARDACAS, DIABETES E HIPERTENSIN ARTERIAL  La hipertensin arterial causa enfermedades cardacas y [place] el riesgo de ictus. La hipertensin arterial es ms probable en los siguientes casos: ? Las personas que tienen la presin arterial en el extremo del rango normal (100-139/85-89 mm Hg). ? Anadarko Petroleum Corporation con sobrepeso u obesidad. ? Scientist, water quality.  Si usted tiene entre 18 y 39 aos, debe medirse la presin arterial cada 3 a 5 aos. Si usted tiene 40 aos o ms, debe medirse la presin arterial Hewlett-Packard. Debe medirse la presin arterial dos veces: una vez cuando est en un hospital o una clnica y la otra vez cuando est en otro sitio. Registre el promedio de Federated Department Stores. Para controlar su presin  arterial cuando no est en un hospital o [place], puede usar lo siguiente: ? Jorje Guild automtica para medir la presin arterial en una farmacia. ? Un monitor para medir la presin arterial en el hogar.  Si tiene entre 75 y 5 aos, consulte a su mdico si debe tomar aspirina para prevenir el ictus.  Realcese exmenes de deteccin de la diabetes con regularidad. Esto incluye la toma de [place] de sangre para controlar el nivel de azcar en la sangre durante el [place]. ? Si tiene un peso normal y un bajo riesgo de padecer diabetes, realcese este anlisis cada tres aos despus de los 45aos. ? Si tiene sobrepeso y un alto riesgo de padecer diabetes, considere someterse a este anlisis antes o con mayor frecuencia. PREVENCIN DE INFECCIONES HepatitisB  Si tiene un riesgo ms alto de Museum/gallery curator hepatitis B, debe someterse a un examen de deteccin de este virus. Se considera que tiene un  alto riesgo de contraer hepatitis B si: ? Naci en un pas donde la hepatitis B es frecuente. Pregntele a su mdico qu pases son considerados de Public affairs consultant. ? Sus padres nacieron en un pas de alto riesgo y usted no recibi una vacuna que lo proteja contra la hepatitis B (vacuna contra la hepatitis B). ? [place]. ? [place] agujas para inyectarse drogas. ? Vive con alguien que tiene hepatitis B. ? Ha tenido sexo con alguien que tiene hepatitis B. ? Recibe tratamiento de hemodilisis. ? Toma ciertos medicamentos para el cncer, trasplante de rganos y afecciones autoinmunitarias. Hepatitis C  Se recomienda un anlisis de [place] para: ? [employer] 1945 y 1965. ? Todas las personas que tengan un riesgo de haber contrado hepatitis C. Enfermedades de transmisin sexual (ETS).  Debe realizarse pruebas de deteccin de enfermedades de transmisin sexual (ETS), incluidas gonorrea y clamidia si: ? Es sexualmente activo y es menor de 38BOF. ? Es mayor de 24aos, y Investment banker, operational  informa que corre riesgo de tener este tipo de infecciones. ? La actividad sexual ha cambiado desde que le hicieron la ltima prueba de deteccin y tiene un riesgo mayor de Best boy clamidia o Radio broadcast assistant. Pregntele al mdico si usted tiene riesgo.  Si no tiene el VIH, pero corre riesgo de infectarse por el virus, se recomienda tomar diariamente un medicamento recetado para evitar la infeccin. Esto se conoce como profilaxis previa a la exposicin. Se considera que est en riesgo si: ? Es [place] sexualmente y no [place] preservativos habitualmente o no conoce el estado del VIH de sus Advertising copywriter. ? Se inyecta drogas. ? Es [place] sexualmente con Ardelia Mems pareja que tiene VIH. Consulte a su mdico para saber si tiene un alto riesgo de infectarse por el VIH. Si opta por comenzar la profilaxis previa a la exposicin, primero debe realizarse anlisis de deteccin del VIH. Luego, le harn anlisis cada 44mses mientras est tomando los medicamentos para la profilaxis previa a la exposicin. EMiami Va Healthcare System Si es premenopusica y puede quedar eGoodyear [place] solicite a su mdico asesoramiento previo a la concepcin.  Si puede quedar embarazada, tome 400 a [id number](mcg) de cido fAnheuser-Busch  Si desea evitar el embarazo, hable con su mdico sobre el control de la natalidad (anticoncepcin). OSTEOPOROSIS Y MENOPAUSIA  La osteoporosis es una enfermedad en la que los huesos pierden los minerales y la fuerza por el avance de la edad. El resultado pueden ser fracturas graves en los hBell[place] El riesgo de osteoporosis puede identificarse con uArdelia Memsprueba de densidad sea.  Si tiene 65aos o ms, o si est en riesgo de sufrir osteoporosis y fracturas, pregunte a su mdico si debe someterse a exmenes.  Consulte a su mdico si debe tomar un suplemento de calcio o de vitamina D para reducir el riesgo de osteoporosis.  La menopausia puede presentar ciertos sntomas fsicos y rGaffer  La terapia de reemplazo  hormonal puede reducir algunos de estos sntomas y rGaffer Consulte a su mdico para saber si la terapia de reemplazo hormonal es conveniente para usted. INSTRUCCIONES PARA EL CUIDADO EN EL HOGAR  Realcese los estudios de rutina de la salud, dentales y de lPublic librarian  MFort Washakie  No consuma ningn producto que contenga tabaco, lo que incluye cigarrillos, tabaco de mHigher education careers advisero cPsychologist, sport and exercise  Si est embarazada, no beba alcohol.  Si est amamantando, reduzca el consumo de alcohol y la frecuencia con la que  consume.  Si es mujer y no est embarazada limite el consumo de alcohol a no ms de 1 medida por da. Una medida equivale a 12onzas de cerveza, 5onzas de vino o 1onzas de bebidas alcohlicas de alta graduacin.  No consuma drogas.  No comparta agujas.  Solicite ayuda a su mdico si necesita apoyo o informacin para abandonar las drogas.  Informe a su mdico si a menudo se siente deprimido.  Notifique a su mdico si alguna vez ha sido vctima de abuso o si no se siente seguro en su hogar. Esta informacin no tiene Marine scientist el consejo del mdico. Asegrese de hacerle al mdico cualquier pregunta que tenga. Document Released: 03/13/2011 Document Revised: 04/14/2014 Document Reviewed: 12/26/2014 Elsevier Interactive Patient Education  2018 [place] ejercicio para Pharmacist, hospital (Exercising to [employer]) Hacer ejercicio puede ayudarlo a Sports coach de peso. Para bajar de [facility] ejercicio, este debe ser de intensidad vigorosa. Puede saber que est haciendo ejercicio de intensidad vigorosa si respira con mucha dificultad y rapidez, y no puede mantener una conversacin. El ejercicio de intensidad moderada ayuda a Contractor peso actual. Puede saber que est haciendo ejercicio de intensidad moderada si tiene una frecuencia cardaca ms elevada y [place] respiracin ms rpida, pero an puede Programmer, systems. [place]? Elija una actividad que disfrute y establezca objetivos realistas. El mdico puede ayudarlo a Paediatric nurse un plan de actividades que funcione para usted. Haga ejercicio regularmente como se lo haya indicado el mdico. Esta puede incluir:  Neurosurgeon de [place] veces por semana, como: ? Flexiones de Merrill Lynch. ? Abdominales. ? Levantamiento de pesas. ? Ejercicios con bandas elsticas.  Realizar una intensidad determinada de ejercicio durante una cantidad determinada de [place]. Elija entre estas opciones: ? 171mnutos de ejercicio de intensidad moderada cada semana. ? 771mutos de ejercicio de intensidad vigorosa cada semana. ? UnWaldron Labse ejercicio de intensidad moderada y vigorosa cada semana. Los nios, las mujeres emOberonlas personas que no estn en forma, las personas con sobrepeso y los adultos mayores tal vez tengan que consultar a un mdico para que les d reGlass blower/designerSi tiene alCommercial Metals Companyasegrese de coTeacher, adult educationl mdico antes de comenzar un programa de ejercicios nuevo. CUTees Toh Caminar a un ritmo de al menos 4,23m104mas (7kilmetros) por hora.  Trotar o correr a un ritmo de 5 millas (8 kilmetros) por hora.  Andar en bicicleta a un ritmo de al menos 10 millas (16 kilmetros) por hora.  Practicar natacin.  Practicar patinaje sobre ruedas normales o en lnea.  Hacer esqu de fondo.  Hacer deportes competitivos vigorosos, como ftbol americano, bsquet y ftbol.  Saltar la soga.  Tomar clases de baile aerbico. CMO PUEDO SER MS ACTIVO EN MIS ACTIVIDADES DIARIAS?  Utilice las escClinical cytogeneticistl ascensor.  D una caminata durante su hora de almuerzo.  Si conduce, estacione el automvil ms lejos del trabajo o de la escuela.  Si usaCanadaansporte pblico, bjese una parada antes y camine el resto del camino.  Pngase de pie y  camine cada vez que haga llamadas telefnicas.  Levntese, estrese y camine cada 39m42mos a lo largo del da. Training and development officer PShello haga ejercicio en exceso que pudiera hacer que se lastime, se sienta mareado o tenga dificultad para respirar.  Consulte al mdico antes de comenzar un programa de ejercicios  nuevo.  Use ropa cmoda y calzado con buen soporte.  Beba gran cantidad de agua mientras hace ejercicios para evitar la deshidratacin o los golpes de Freight forwarder. Durante la actividad fsica se pierde agua corporal que se debe reponer.  Haga ejercicio hasta que se acelere su respiracin y sus latidos cardacos. Esta informacin no tiene Marine scientist el consejo del mdico. Asegrese de hacerle al mdico cualquier pregunta que tenga. Document Released: 06/28/2010 Document Revised: 04/14/2014 Document Reviewed: 08/25/2013 Elsevier Interactive Patient Education  2018 [place] de caloras para bajar de peso (Calorie Counting for [employer] Mutual Life Loss) Las caloras son energa que se obtiene de lo que se come y se bebe. El organismo [place] esta energa para mantenerlo Curator. La cantidad de caloras que come tiene incidencia Lubrizol Corporation. Cuando come ms caloras de las que el cuerpo necesita, este acumula las caloras extra [place]. Cuando come Universal Health de las que el cuerpo [place], este quema grasa para obtener la energa que requiere. El recuento de caloras es el registro de la cantidad de caloras que come y Pharmacologist. Si se asegura de comer menos caloras de las que el cuerpo necesita, debe bajar de [place]. Para que el recuento de caloras funcione, tendr que comer la cantidad de caloras adecuadas para usted en un da, para bajar una cantidad de peso saludable por semana. Una cantidad de peso saludable para bajar por semana suele ser [place] 1 y Ivar Drape (0,5 a 0,9kg). Un nutricionista puede determinar la cantidad de caloras que  necesita por da y sugerirle cmo alcanzar su objetivo calrico. CUL ES MI PLAN? Mi objetivo es comer __________ Raenette Rover da. Si como esta cantidad de caloras por da, debo bajar unas __________ Terrall Laity. QU DEBO SABER ACERCA DEL [place]? A fin de alcanzar su objetivo diario de caloras, tendr que:  Averiguar cuntas caloras hay en cada alimento que le Therapist, occupational. Intente hacerlo antes de comer.  Decida la cantidad que puede comer del alimento.  Anote lo que comi y cuntas caloras tena. Esta tarea se conoce como llevar un registro de comidas. DNDE ENCUENTRO INFORMACIN SOBRE LAS CALORAS? Es posible Animator cantidad de caloras que contiene un alimento en la etiqueta de informacin nutricional. Tenga en cuenta que toda la informacin que se incluye en una etiqueta se basa en una porcin especfica del alimento. Si un alimento no tiene una etiqueta de informacin nutricional, intente buscar las caloras en Internet o pida ayuda al nutricionista. CMO DECIDO CUNTO COMER? Para decidir qu cantidad del alimento puede comer, tendr que tener en cuenta el nmero de caloras en una porcin y el tamao de una porcin. Es posible Financial trader en la etiqueta de informacin nutricional. Si un alimento no tiene una etiqueta de informacin nutricional, busque los [place] en Internet o pida ayuda al nutricionista. Recuerde que las caloras se calculan por porcin. Si opta por comer ms de una porcin de un alimento, tendr Tenneco Inc las caloras por porcin por la cantidad de porciones que planea comer. Por ejemplo, la etiqueta de un envase de pan puede decir que el tamao de una porcin es [place], y que una porcin tiene 90caloras. Si come 1rodaja, habr comido 90caloras. Si come 2rodajas, habr comido 180caloras. [place]? Despus de cada comida, registre la siguiente informacin en el registro de comidas:  Lo  que comi.  La cantidad que comi.  La cantidad de caloras que  tenaElveria Royals, sume las caloras. Tenga a Materials engineer de comidas, por ejemplo, en un anotador de bolsillo. Otra alternativa es usar una aplicacin en el telfono mvil o un sitio web. Algunos programas calcularn las caloras y Family Dollar Stores la cantidad de caloras que le [place], [place] vez que agregue un alimento al Control and instrumentation engineer. CULES SON ALGUNOS CONSEJOS PARA EL [place]?  Use las caloras de los alimentos y las bebidas que lo sacien y no lo dejen con apetito, por ejemplo, frutos secos y Engineer, mining de frutos secos, verduras, protenas magras y alimentos con alto contenido de fibra (ms de 5g de fibra por porcin).  Coma alimentos nutritivos y evite las caloras vacas. Las caloras vacas son aquellas que se obtienen de los alimentos o las bebidas que no contienen muchos nutrientes, como los dulces y los refrescos. Es mejor comer una comida nutritiva altamente calrica (como un aguacate) que una con pocos nutrientes (como una bolsa de patatas fritas).  Sepa cuntas caloras tienen los alimentos que come con ms frecuencia. eBay, no tiene que buscar este dato cada vez que los come.  Est atento a los Chiropodist hipocalricos, pero que en realidad contienen muchas caloras, como los productos de [place], los refrescos y los dulces sin Lobbyist.  Preste atencin a las Automatic Data, Franklin Resources refrescos, las bebidas a base de [place], las bebidas con alcohol y los jugos, que contienen muchas caloras, pero no le dan saciedad. Opte por las bebidas bajas en caloras, como el agua y las bebidas dietticas.  Concntrese en tratar de contar las caloras de los alimentos que tienen la mayor cantidad de caloras. Registrar las caloras de una ensalada que solo contiene hortalizas es menos importante que calcular las de un batido de [place].  Encuentre un mtodo para controlar las caloras que  funcione para usted. Sea creativo. La State Farm de las personas que alcanzan el xito encuentran mtodos para llevar un registro de cunto comen en un da, incluso si no cuentan cada calora.  CULES SON ALGUNOS CONSEJOS PARA CONTROLAR LAS PORCIONES?  Sepa cuntas caloras hay en una porcin. Esto lo ayudar a saber cuntas porciones de un alimento determinado puede comer.  Use una taza medidora para medir los Northrop Grumman, lo que es muy til al principio. Con el tiempo, podr hacer un clculo estimativo de los tamaos de las porciones de algunos alimentos.  Dedique tiempo a poner porciones de diferentes alimentos en sus platos, tazones y tazas predilectos, a fin de saber cmo se ve una porcin.  Intente no comer directamente de [place] bolsa o una caja, ya que esto puede llevarlo a comer en exceso. Ponga la cantidad Land O[employer]Lakes gustara comer en una taza o un plato, a fin de asegurarse de que est comiendo la porcin correcta.  Use platos, vasos y tazones ms pequeos para no comer en exceso. Esta es una forma rpida y sencilla de poner en prctica el control de las porciones. Si el plato es ms pequeo, le caben menos alimentos.  Intente no hacer muchas tareas mientras come, como ver televisin o usar la computadora. Si es la hora de comer, sintese a Conservation officer, nature y disfrute de Environmental education officer. Esto lo ayudar a que empiece a Marine scientist cundo est satisfecho. Tambin le permitir estar ms consciente de lo que come y cunto est comiendo.  [place]?  Pida porciones ms pequeas o porciones para nios.  Considere la posibilidad de Publishing rights manager un plato principal y las guarniciones, en lugar de pedir su propio plato principal.  Si pide su propio plato principal, coma solo la mitad. Pida una caja al comienzo de la comida y ponga all el resto del plato principal, para no sentir la tentacin de comerlo.  Busque las caloras en el men. Si se detallan las  caloras, elija las opciones que contengan la menor cantidad.  Elija platos que incluyan verduras, frutas, cereales integrales, productos lcteos con bajo contenido de grasa y Advertising account planner. Centrarse en elegir con inteligencia alimentos de cada uno de los 5grupos que puede ayudarlo a seguir por el buen camino en los restaurantes.  Opte por los alimentos hervidos, asados, cocidos a la parrilla o al vapor.  Elija el agua, la [place], [place] t helado sin azcar u otras bebidas que no contengan azcares agregados. Si desea una bebida alcohlica, escoja una opcin con menos caloras. Por ejemplo, un margarita normal puede [employer], y un vaso de vino tiene unas 150.  No coma alimentos que contengan mantequilla, estn empanados, fritos o que se sirvan con salsa a base de crema. Generalmente, los alimentos que se etiquetan como "crujientes" estn fritos, a menos que se indique lo contrario.  Ordene los [employer], las salsas y los jarabes aparte, ya que suelen tener muchas caloras; por lo tanto, no los consuma en grandes cantidades.  Tenga cuidado con las [place]. Muchas personas piensan que las ensaladas son Ardelia Mems opcin saludable, pero en muchas cosas, esto no es as. Hay muchas ensaladas que contienen tocino, pollo frito, grandes cantidades de [place], patatas fritas y Lexicographer. Todos estos productos son altamente calricos. Si desea Katherine Mantle, elija una de hortalizas y pida carnes a la parrilla o un filete. Ordene el aderezo aparte o pida aceite de [place] y vinagre o limn para Haematologist.  Haga un clculo estimativo de la cantidad de porciones que le sirven. Por ejemplo, una porcin de arroz cocido equivale a media taza o tiene el tamao aproximado de un molde de [place], o de media pelota de tenis. Conocer el tamao de las porciones lo ayudar a Personnel officer atento a la cantidad de comida que come [employer]. La lista que sigue le [place] el tamao de algunas porciones comunes a partir de  objetos cotidianos. ? 1onza (28g) = 4dados apilados. ? 3onzas (85g) = 50mzo de cartas. ? 1cucharadita = 1dado. ? 1cucharada = media pelota de tenis de mesa. ? 2cucharadas = 1pelota de tenis de mesa. ? Media taza = 1pelota de tenis o 146moe de magdalena. ? 1taza = 1 pelota de bisbol.  Esta informacin no tiene coMarine scientistl consejo del mdico. Asegrese de hacerle al mdico cualquier pregunta que tenga. Document Released: 07/10/2008 Document Revised: 04/14/2014 Document Reviewed: 01/27/2013 Elsevier Interactive Patient Education  2017 [employer]

## 2017-01-27 NOTE — Telephone Encounter (Signed)
Pt informed

## 2017-01-29 ENCOUNTER — Ambulatory Visit: Payer: Self-pay

## 2017-01-29 ENCOUNTER — Ambulatory Visit
Admission: RE | Admit: 2017-01-29 | Discharge: 2017-01-29 | Disposition: A | Discharge status: Home / Self Care | Payer: BLUE CROSS/BLUE SHIELD | Source: Ambulatory Visit | Attending: Obstetrics & Gynecology | Admitting: Obstetrics & Gynecology

## 2017-01-29 DIAGNOSIS — Z803 Family history of malignant neoplasm of breast: Secondary | ICD-10-CM

## 2017-01-29 DIAGNOSIS — N644 Mastodynia: Secondary | ICD-10-CM

## 2017-01-29 DIAGNOSIS — R922 Inconclusive mammogram: Secondary | ICD-10-CM | POA: Diagnosis not present

## 2017-01-29 IMAGING — MG 2D DIGITAL DIAGNOSTIC BILATERAL MAMMOGRAM WITH CAD AND ADJUNCT T
8 of 12 series · 8 of 28 positions shown · non-contrast
Comparison: None.

CLINICAL DATA: 35-year-old presenting with approximate 1 year
history of intermittent diffuse upper left breast pain. Baseline
mammography.

[L MLO synth-2D]
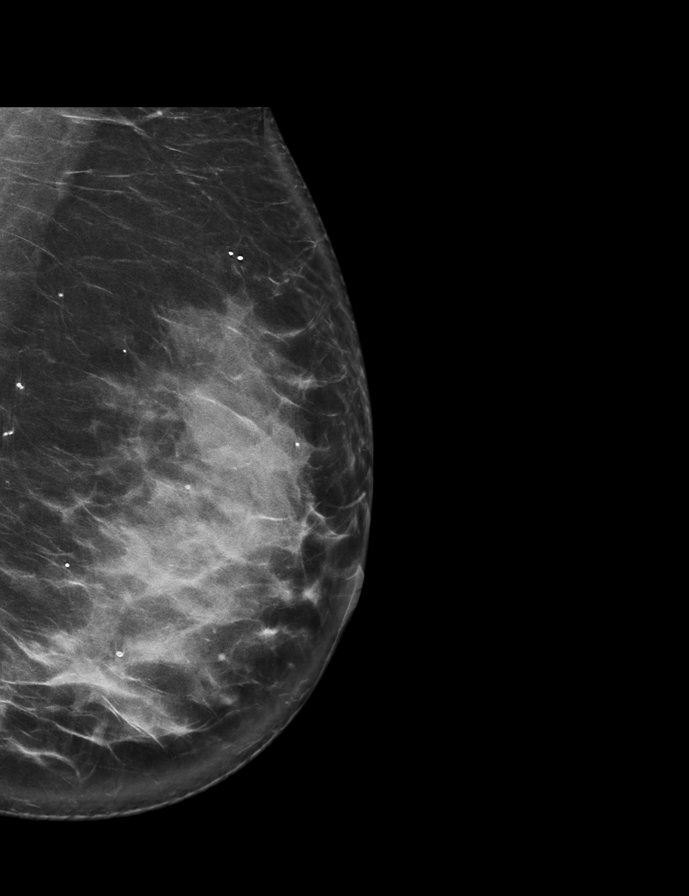

[R MLO]
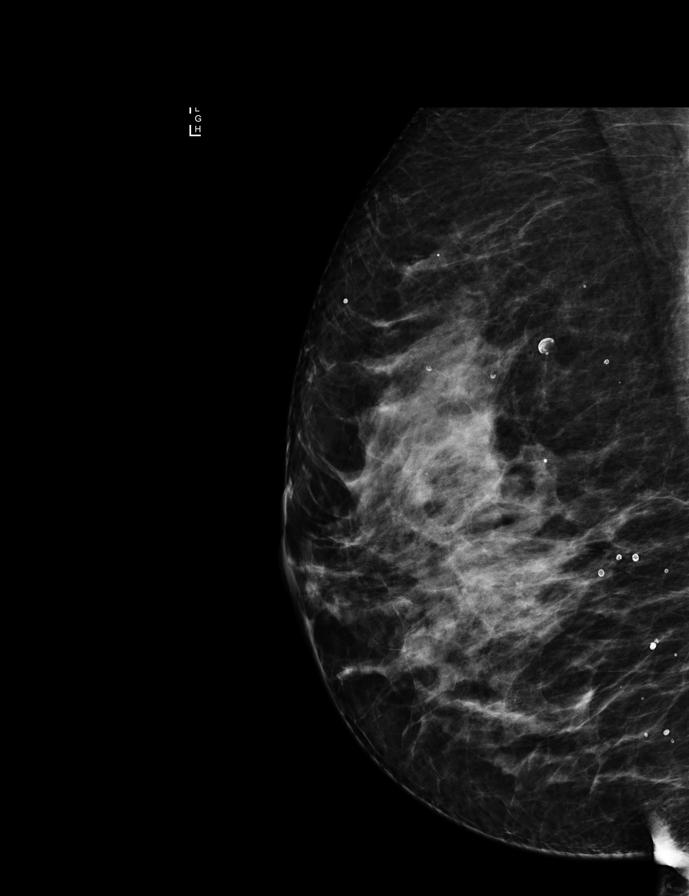

[L MLO]
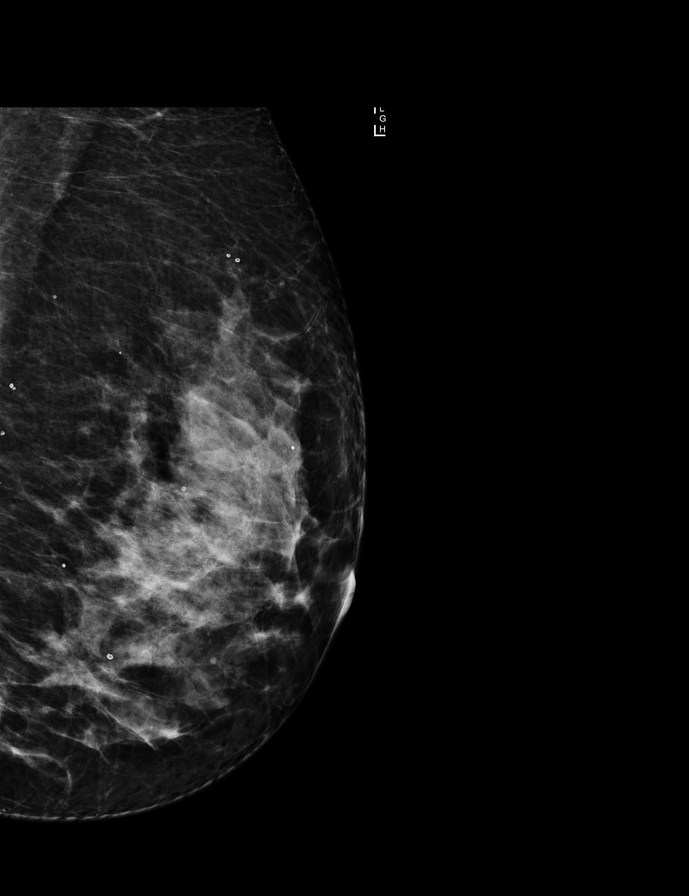

[R CC]
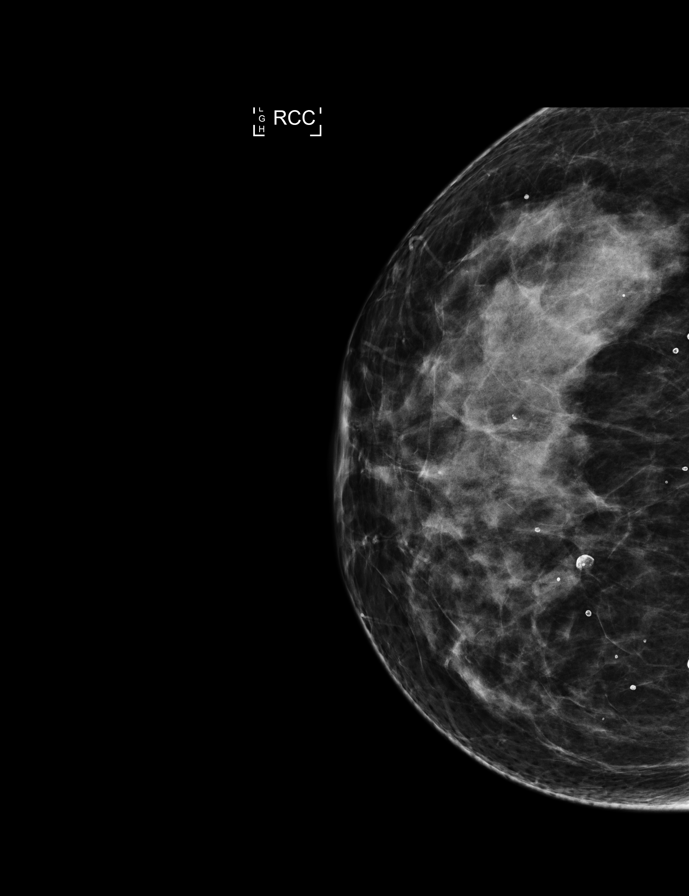

[L CC]
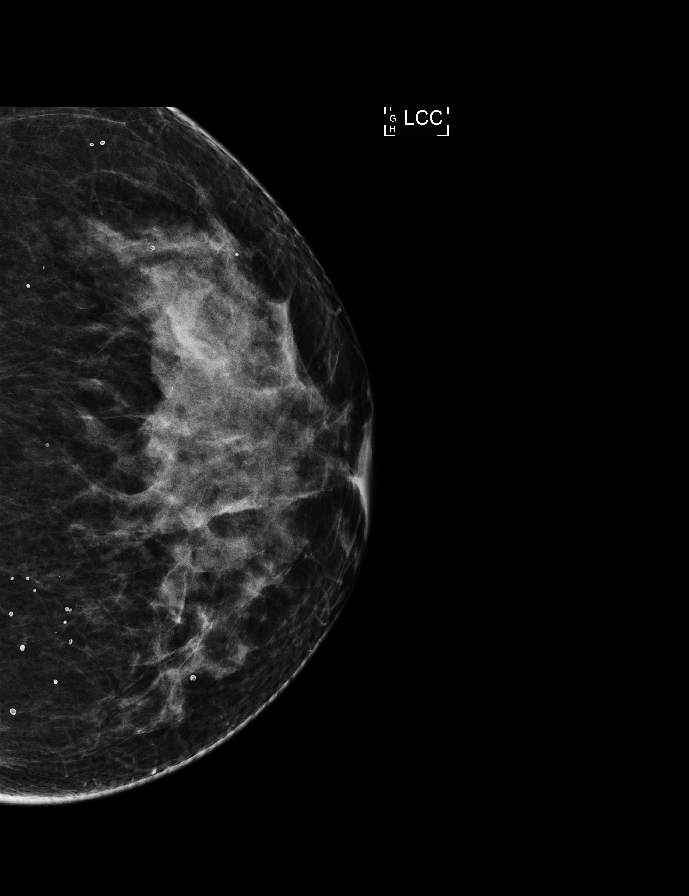

[R MLO synth-2D]
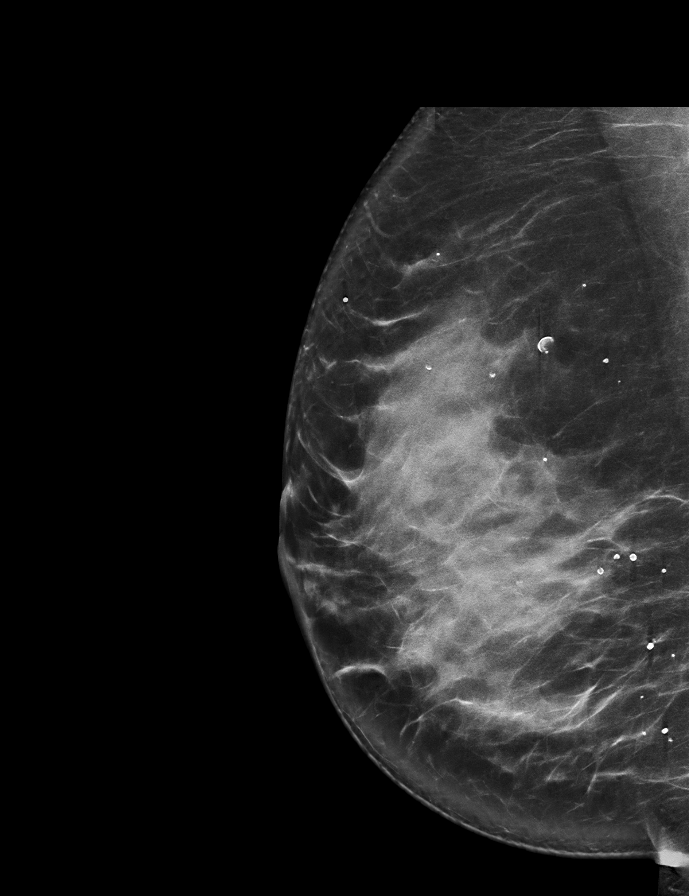

[L CC synth-2D]
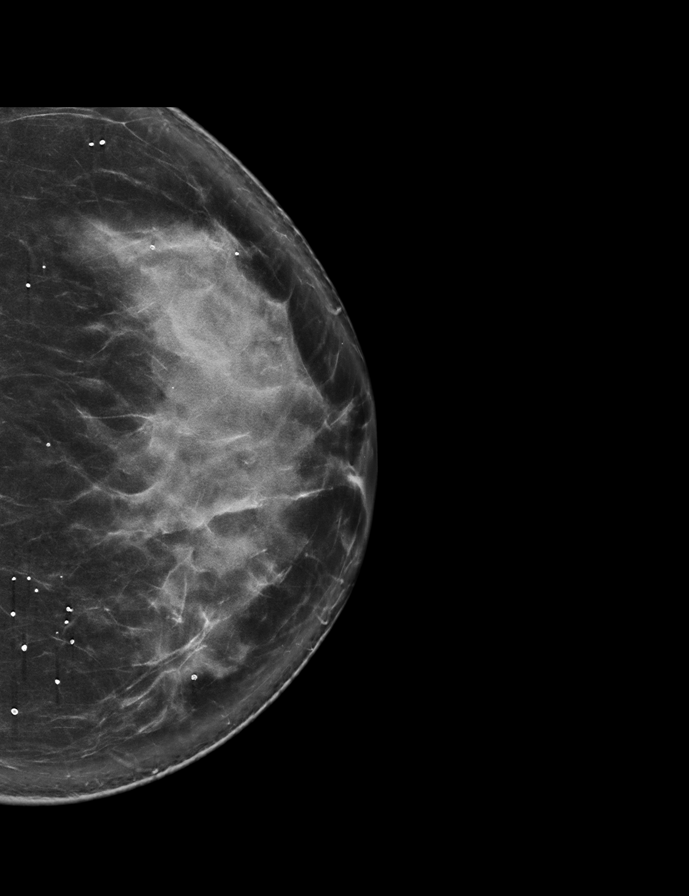

[R CC synth-2D]
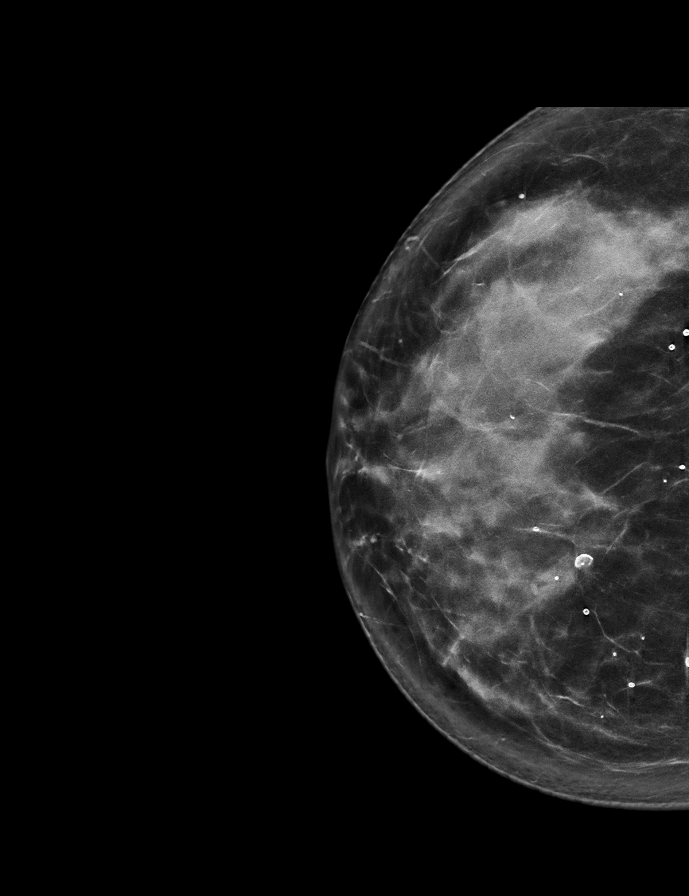

[8 of 28 positions shown; findings below may reference images not displayed]

Strong family history of breast cancer in her maternal grandmother
and in her mother, both of whom were diagnosed in their 40s. Patient
states that she has undergone genetic testing and is negative for
the [5E] and [5E] mutation. To her knowledge, she has not had her
lifetime breast cancer risk estimated.

EXAM:
2D DIGITAL DIAGNOSTIC BILATERAL MAMMOGRAM WITH CAD AND ADJUNCT TOMO
ACR Breast Density Category d: The breast tissue is extremely dense,
which lowers the sensitivity of mammography.
FINDINGS: Standard 2D and tomosynthesis full field CC and MLO views of both
breasts were obtained.

No findings suspicious for malignancy in either breast.

Mammographic images were processed with CAD.
IMPRESSION: No mammographic evidence of malignancy involving either breast.

RECOMMENDATION:
1. It is recommended that first degree relatives of breast cancer
patients begin annual screening 10 years prior to the age of
diagnosis of the breast cancer patient. Since the patient's mother
was diagnosed at age 44, screening mammography in one year is
recommended.(Code:[5E])
2. Consider referral for genetic counseling for the patient and her
sister, given the strong family history of premenopausal breast
cancer. If the patient (and/or her sister) have an estimated
lifetime risk of developing breast cancer of greater than 20%,
annual high risk screening MRI would also be recommended in addition
to annual screening mammography. The screening MRI would ideally be
done 6 months after screening mammography.

I have discussed the findings and recommendations with the patient.
Results were also provided in writing at the conclusion of the
visit. If applicable, a reminder letter will be sent to the patient
regarding the next appointment.

BI-RADS CATEGORY  1: Negative.

## 2017-02-01 LAB — HEPATITIS C ANTIBODY
Hepatitis C Ab: NONREACTIVE
SIGNAL TO CUT-OFF: 0.01 (ref <1.00)

## 2017-02-01 LAB — CBC
HCT: 39.4 % (ref 35.0–45.0)
Hemoglobin: 13.3 g/dL (ref 11.7–15.5)
MCH: 30 pg (ref 27.0–33.0)
MCHC: 33.8 g/dL (ref 32.0–36.0)
MCV: 88.7 fL (ref 80.0–100.0)
MPV: 11.4 fL (ref 7.5–12.5)
Platelets: 230 10*3/uL (ref 140–400)
RBC: 4.44 10*6/uL (ref 3.80–5.10)
RDW: 12.1 % (ref 11.0–15.0)
WBC: 5.8 10*3/uL (ref 3.8–10.8)

## 2017-02-01 LAB — VITAMIN D 1,25 DIHYDROXY
Vitamin D 1, 25 (OH)2 Total: 65 pg/mL (ref 18–72)
Vitamin D3 1, 25 (OH)2: 65 pg/mL

## 2017-02-01 LAB — LIPID PANEL
Cholesterol: 161 mg/dL (ref <200)
HDL: 43 mg/dL — ABNORMAL LOW (ref >50)
LDL Cholesterol (Calc): 94 mg/dL (calc)
Non-HDL Cholesterol (Calc): 118 mg/dL (calc) (ref <130)
Total CHOL/HDL Ratio: 3.7 (calc) (ref <5.0)
Triglycerides: 147 mg/dL (ref <150)

## 2017-02-01 LAB — COMPREHENSIVE METABOLIC PANEL
AG Ratio: 1.9 (calc) (ref 1.0–2.5)
ALBUMIN MSPROF: 4.7 g/dL (ref 3.6–5.1)
ALT: 15 U/L (ref 6–29)
AST: 13 U/L (ref 10–30)
Alkaline phosphatase (APISO): 59 U/L (ref 33–115)
BUN: 7 mg/dL (ref 7–25)
CHLORIDE: 104 mmol/L (ref 98–110)
CO2: 24 mmol/L (ref 20–32)
Calcium: 9.5 mg/dL (ref 8.6–10.2)
Creat: 0.68 mg/dL (ref 0.50–1.10)
GLOBULIN: 2.5 g/dL (ref 1.9–3.7)
Glucose, Bld: 92 mg/dL (ref 65–99)
POTASSIUM: 3.7 mmol/L (ref 3.5–5.3)
Sodium: 136 mmol/L (ref 135–146)
Total Bilirubin: 0.8 mg/dL (ref 0.2–1.2)
Total Protein: 7.2 g/dL (ref 6.1–8.1)

## 2017-02-01 LAB — HIV ANTIBODY (ROUTINE TESTING W REFLEX): HIV: NONREACTIVE

## 2017-02-01 LAB — HEPATITIS B SURFACE ANTIGEN: Hepatitis B Surface Ag: NONREACTIVE

## 2017-02-01 LAB — RPR: RPR Ser Ql: NONREACTIVE

## 2017-02-01 LAB — TSH: TSH: 2.97 m[IU]/L

## 2017-02-04 ENCOUNTER — Ambulatory Visit: Payer: BLUE CROSS/BLUE SHIELD | Admitting: Obstetrics & Gynecology

## 2017-02-04 ENCOUNTER — Other Ambulatory Visit: Payer: BLUE CROSS/BLUE SHIELD

## 2017-02-25 ENCOUNTER — Ambulatory Visit: Payer: BLUE CROSS/BLUE SHIELD | Admitting: Obstetrics & Gynecology

## 2017-02-25 ENCOUNTER — Other Ambulatory Visit: Payer: BLUE CROSS/BLUE SHIELD

## 2017-04-27 ENCOUNTER — Ambulatory Visit: Payer: BLUE CROSS/BLUE SHIELD | Admitting: Obstetrics & Gynecology

## 2017-04-27 ENCOUNTER — Other Ambulatory Visit: Payer: BLUE CROSS/BLUE SHIELD

## 2017-05-25 ENCOUNTER — Other Ambulatory Visit: Payer: BLUE CROSS/BLUE SHIELD

## 2017-05-25 ENCOUNTER — Ambulatory Visit: Payer: BLUE CROSS/BLUE SHIELD | Admitting: Obstetrics & Gynecology

## 2017-06-26 DIAGNOSIS — Z0000 Encounter for general adult medical examination without abnormal findings: Secondary | ICD-10-CM | POA: Diagnosis not present

## 2017-07-20 ENCOUNTER — Ambulatory Visit (INDEPENDENT_AMBULATORY_CARE_PROVIDER_SITE_OTHER): Payer: BLUE CROSS/BLUE SHIELD | Admitting: Obstetrics & Gynecology

## 2017-07-20 ENCOUNTER — Encounter: Payer: Self-pay | Admitting: Obstetrics & Gynecology

## 2017-07-20 ENCOUNTER — Ambulatory Visit (INDEPENDENT_AMBULATORY_CARE_PROVIDER_SITE_OTHER): Payer: BLUE CROSS/BLUE SHIELD

## 2017-07-20 DIAGNOSIS — Z803 Family history of malignant neoplasm of breast: Secondary | ICD-10-CM | POA: Diagnosis not present

## 2017-07-20 DIAGNOSIS — T8332XD Displacement of intrauterine contraceptive device, subsequent encounter: Secondary | ICD-10-CM

## 2017-07-20 DIAGNOSIS — T8332XA Displacement of intrauterine contraceptive device, initial encounter: Secondary | ICD-10-CM | POA: Diagnosis not present

## 2017-07-20 DIAGNOSIS — Z30431 Encounter for routine checking of intrauterine contraceptive device: Secondary | ICD-10-CM | POA: Diagnosis not present

## 2017-07-20 NOTE — Progress Notes (Signed)
    [name] 04/18/1980 [id number]        37 y.o.  G1P1   RP: Paragard IUD with lost strings for Pelvic US  HPI: Well on Paragard IUD x 12/2013 with normal menses every month and no pelvic pain.  Strings not visible on Gyn exam 01/26/2017.  Positive family history of breast cancer.  Patient had a genetic consultation in 2015.  Per patient did genetic screening was negative, but would like to know her calculated risk based on her family history.  If that calculated risk is high, would like to do an MRI of the breasts.   OB History  Gravida Para Term Preterm AB Living  1 1       1   SAB TAB Ectopic Multiple Live Births               # Outcome Date GA Lbr Len/2nd Weight Sex Delivery Anes PTL Lv  1 Para             Past medical history,surgical history, problem list, medications, allergies, family history and social history were all reviewed and documented in the EPIC chart.   Directed ROS with pertinent positives and negatives documented in the history of present illness/assessment and plan.  Exam:  There were no vitals filed for this visit. General appearance:  Normal  Pelvic US today: T/V images.  Uterus anteverted and homogeneous measuring 9.49 x 6.86 x 4.46 cm.  Endometrial line normal at 11.5 mm.  IUD seen in normal intrauterine position.  Right and left ovaries normal.  No apparent mass in the right or left adnexa.  No free fluid in the posterior cul-de-sac.   Assessment/Plan:  37 y.o. G1P1   1. Intrauterine contraceptive device threads lost, subsequent encounter IUD in normal IU position per Pelvic US today.  Patient reassured.  Normal Pelvic US otherwise.  2. Family history of breast cancer in mother Obtain Genetic consultation done in 2015.  Genetic screening was negative, but risk evaluation calculated based on Family Hx.  If risk of Breast Ca is high per that calculation, would like to do an MRI of the breasts.  Counseling on above issues and coordination of care  more than 50% for 15 minutes.  Genia DelMarie-Lyne Timotheus Salm MD, 10:17 AM 07/20/2017

## 2017-07-20 NOTE — Patient Instructions (Signed)
1. Intrauterine contraceptive device threads lost, subsequent encounter IUD in normal IU position per Pelvic US today.  Patient reassured.  Normal Pelvic US otherwise.  2. Family history of breast cancer in mother Obtain Genetic consultation done in 2015.  Genetic screening was negative, but risk evaluation calculated based on Family Hx.  If risk of Breast Ca is high per that calculation, would like to do an MRI of the breasts.  Marian Sorrowlga, un placer verle hoy!

## 2017-07-22 ENCOUNTER — Telehealth: Payer: Self-pay | Admitting: *Deleted

## 2017-07-22 NOTE — Telephone Encounter (Signed)
-----   Message from Genia DelMarie-Lyne Lavoie, MD sent at 07/22/2017 12:20 PM EDT ----- Regarding: Genetic counseling  Please inform patient that I read the whole information from the Genetic Counseling done in 2015.  There is no risk estimate percentage for breast Ca based on her family history in that consultation encounter.  Please call genetic counseling for them to put an addendum with that calculation.  The idea is to have a percentage of risk that would or not justify an MRI of the breasts. Thanks

## 2017-07-22 NOTE — Telephone Encounter (Signed)
Elane FritzBlanca this patient is spanish speaking, can you relay this information to here.   Also I just want to confirm patient has not see another genetic counselor and only [facility], so I can relay forward her message [facility] about putting an addendum.  Let me know please. Thanks

## 2017-12-17 ENCOUNTER — Other Ambulatory Visit: Payer: Self-pay | Admitting: Obstetrics & Gynecology

## 2017-12-17 DIAGNOSIS — Z1231 Encounter for screening mammogram for malignant neoplasm of breast: Secondary | ICD-10-CM

## 2018-01-27 ENCOUNTER — Encounter: Payer: BLUE CROSS/BLUE SHIELD | Admitting: Obstetrics & Gynecology

## 2018-02-01 ENCOUNTER — Ambulatory Visit
Admission: RE | Admit: 2018-02-01 | Discharge: 2018-02-01 | Disposition: A | Discharge status: Home / Self Care | Payer: BLUE CROSS/BLUE SHIELD | Source: Ambulatory Visit | Attending: Obstetrics & Gynecology | Admitting: Obstetrics & Gynecology

## 2018-02-01 DIAGNOSIS — Z1231 Encounter for screening mammogram for malignant neoplasm of breast: Secondary | ICD-10-CM

## 2018-02-01 IMAGING — MG DIGITAL SCREENING BILATERAL MAMMOGRAM WITH TOMO AND CAD
8 series · 8 of 24 positions shown · non-contrast
Comparison: Previous exam(s).

CLINICAL DATA: Screening.

EXAM:
DIGITAL SCREENING BILATERAL MAMMOGRAM WITH TOMO AND CAD

[R CC synth-2D]
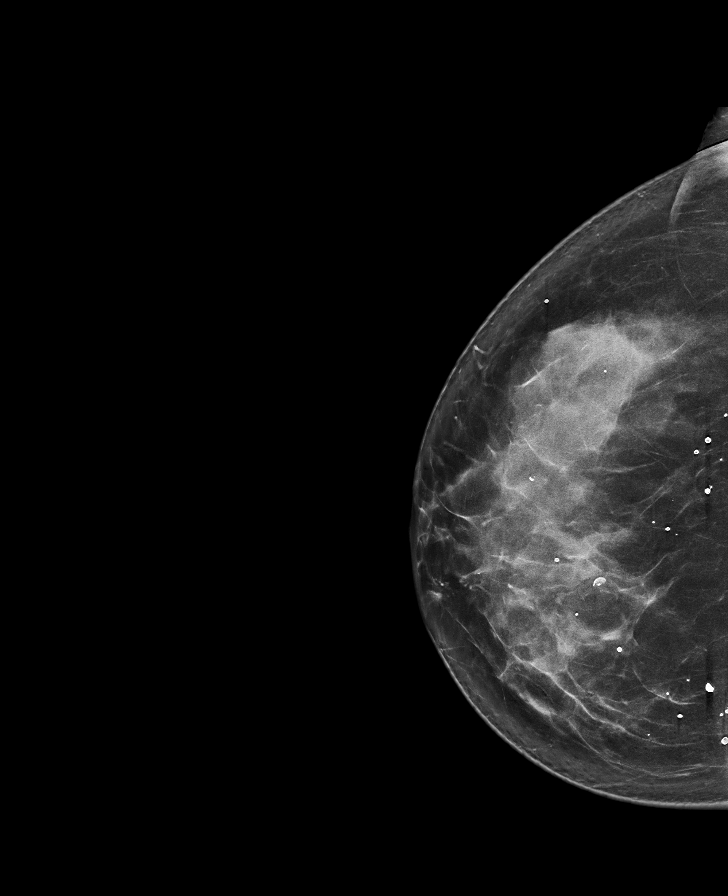

[L MLO synth-2D]
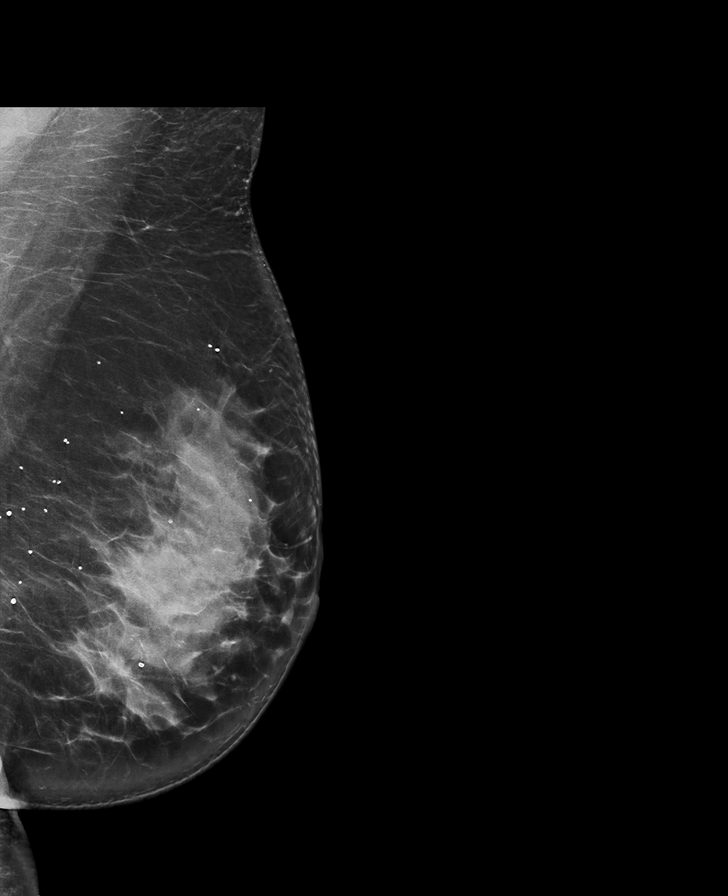

[L CC synth-2D]
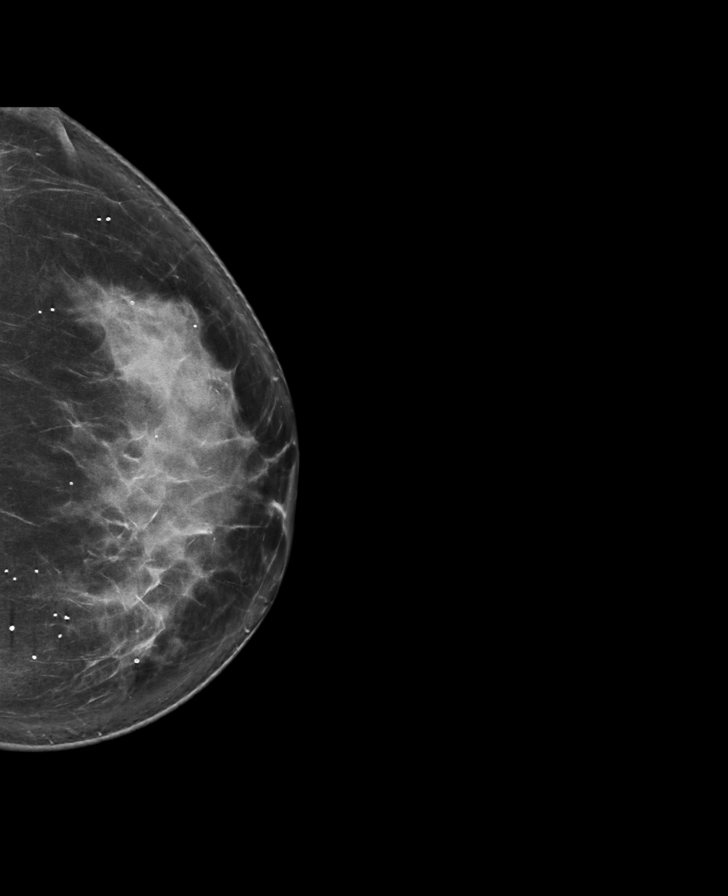

[R MLO synth-2D]
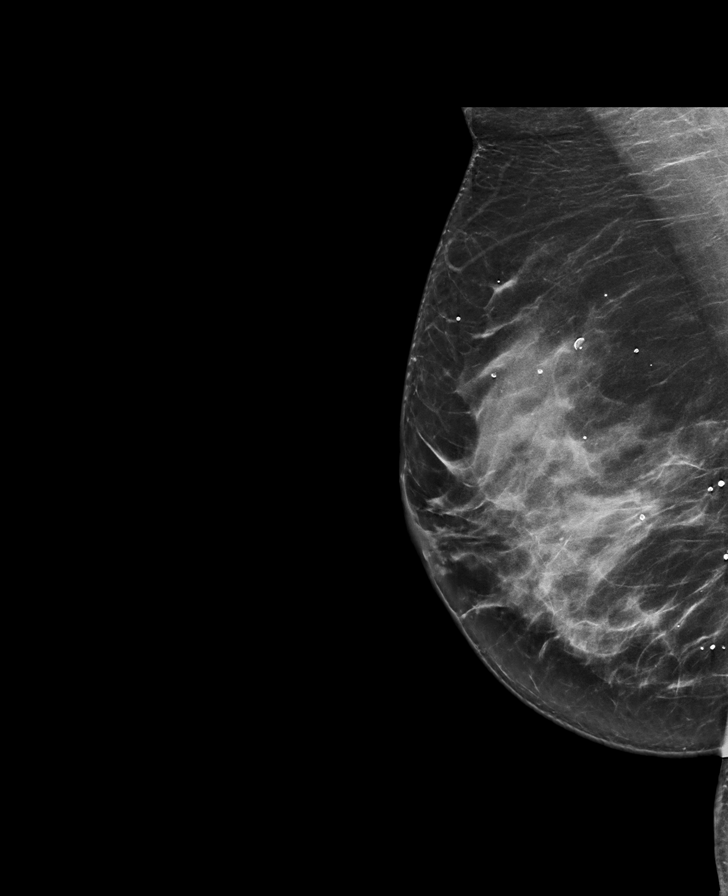

[R MLO tomo · tomo slice 43/86.0]
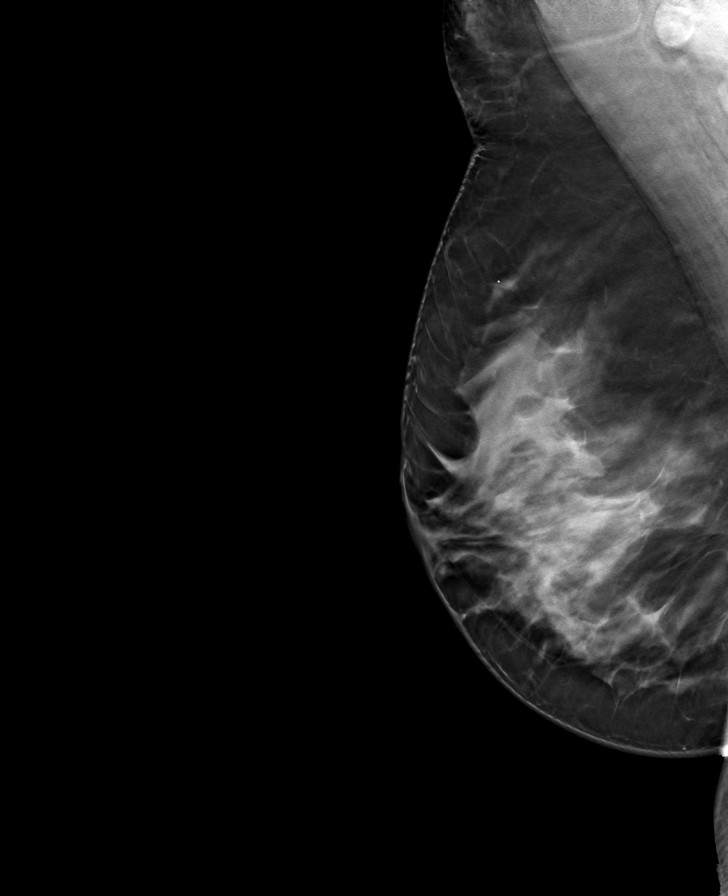

[L MLO tomo · tomo slice 45/90.0]
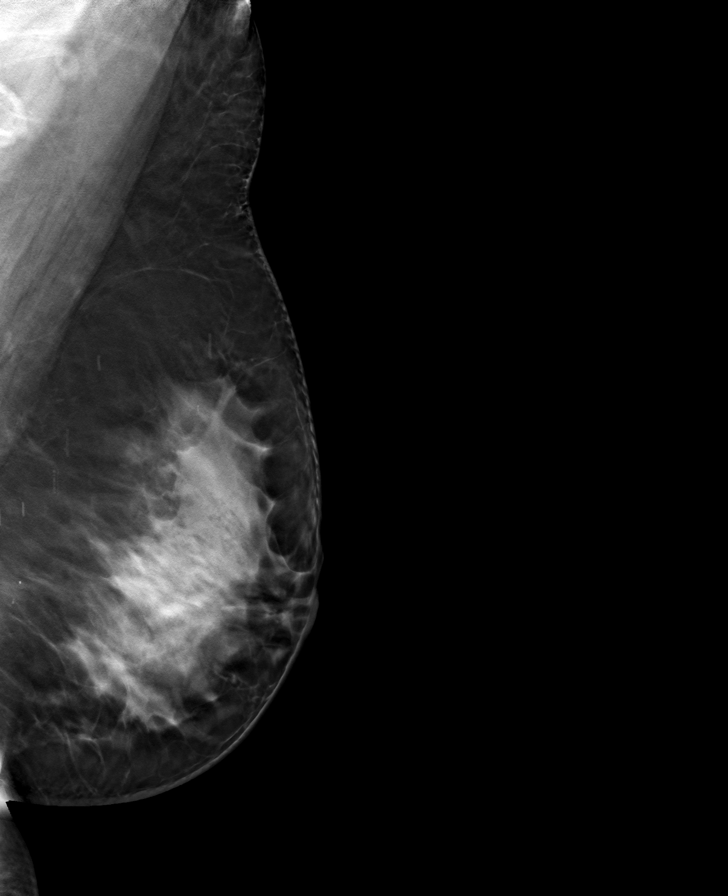

[R CC tomo · tomo slice 44/87.0]
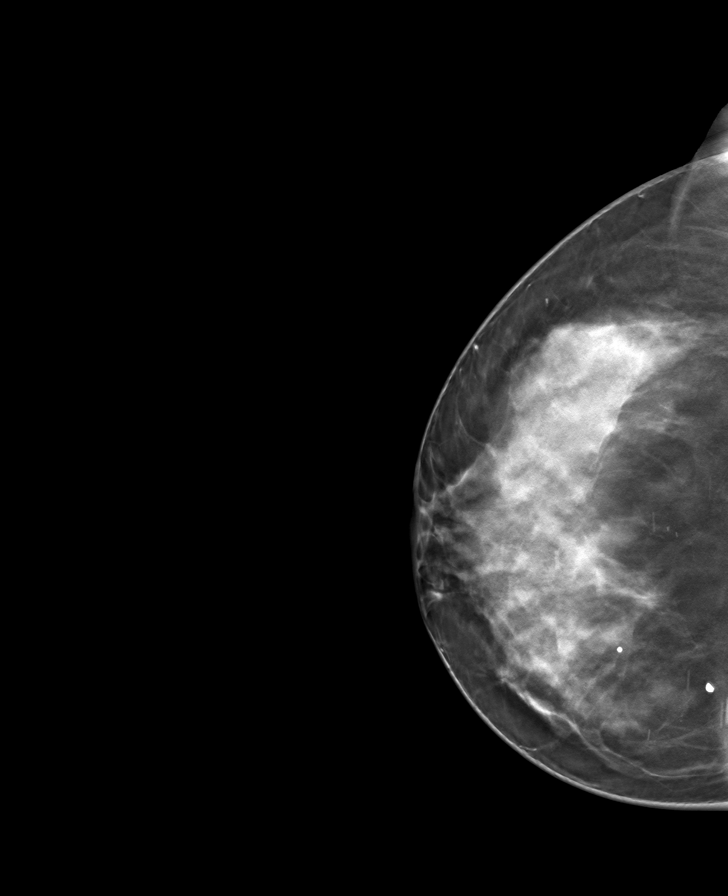

[L CC tomo · tomo slice 43/85.0]
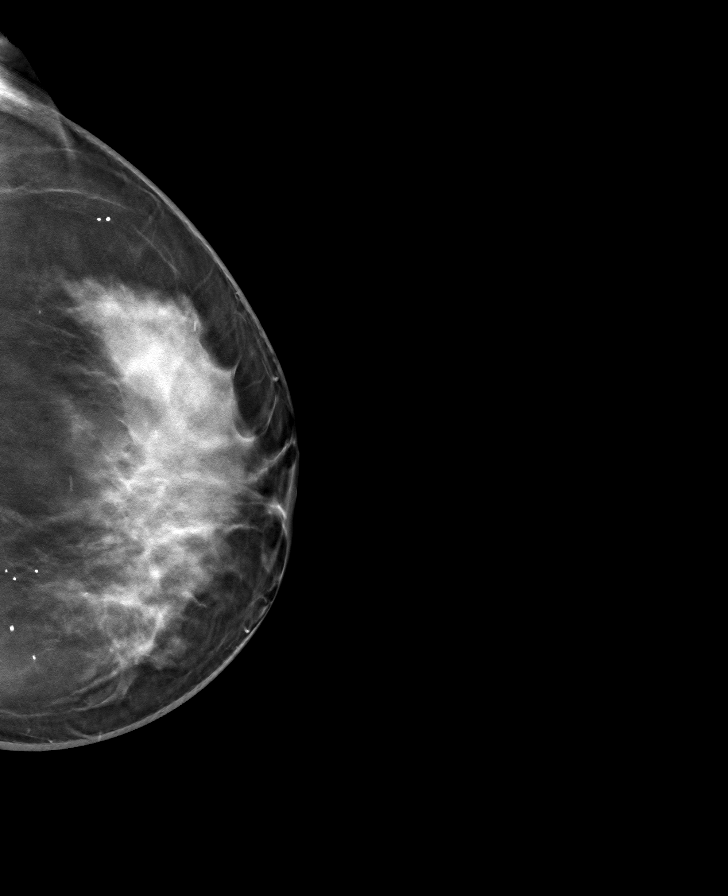

[8 of 24 positions shown; findings below may reference images not displayed]

ACR Breast Density Category d: The breast tissue is extremely dense,
which lowers the sensitivity of mammography.
FINDINGS: There are no findings suspicious for malignancy. Images were
processed with CAD.
IMPRESSION: No mammographic evidence of malignancy. A result letter of this
screening mammogram will be mailed directly to the patient.

RECOMMENDATION:
Screening mammogram at age 42. (Code:[F6])

BI-RADS CATEGORY  1: Negative.

## 2018-05-26 DIAGNOSIS — H6983 Other specified disorders of Eustachian tube, bilateral: Secondary | ICD-10-CM | POA: Diagnosis not present

## 2018-05-26 DIAGNOSIS — Z789 Other specified health status: Secondary | ICD-10-CM | POA: Diagnosis not present

## 2018-05-26 DIAGNOSIS — J101 Influenza due to other identified influenza virus with other respiratory manifestations: Secondary | ICD-10-CM | POA: Diagnosis not present

## 2018-06-15 DIAGNOSIS — M79642 Pain in left hand: Secondary | ICD-10-CM | POA: Diagnosis not present

## 2018-06-15 DIAGNOSIS — S6992XA Unspecified injury of left wrist, hand and finger(s), initial encounter: Secondary | ICD-10-CM | POA: Diagnosis not present

## 2018-06-15 DIAGNOSIS — M25512 Pain in left shoulder: Secondary | ICD-10-CM | POA: Diagnosis not present

## 2018-06-15 DIAGNOSIS — S299XXA Unspecified injury of thorax, initial encounter: Secondary | ICD-10-CM | POA: Diagnosis not present

## 2018-06-15 DIAGNOSIS — M25532 Pain in left wrist: Secondary | ICD-10-CM | POA: Diagnosis not present

## 2018-06-15 DIAGNOSIS — S4992XA Unspecified injury of left shoulder and upper arm, initial encounter: Secondary | ICD-10-CM | POA: Diagnosis not present

## 2018-06-15 DIAGNOSIS — R0781 Pleurodynia: Secondary | ICD-10-CM | POA: Diagnosis not present

## 2019-02-01 ENCOUNTER — Other Ambulatory Visit: Payer: Self-pay | Admitting: Registered"

## 2019-02-01 DIAGNOSIS — Z20822 Contact with and (suspected) exposure to covid-19: Secondary | ICD-10-CM

## 2019-02-03 LAB — NOVEL CORONAVIRUS, NAA: SARS-CoV-2, NAA: NOT DETECTED

## 2019-02-15 ENCOUNTER — Other Ambulatory Visit: Payer: Self-pay

## 2019-02-15 ENCOUNTER — Encounter: Payer: Self-pay | Admitting: Physician Assistant

## 2019-02-15 ENCOUNTER — Ambulatory Visit (INDEPENDENT_AMBULATORY_CARE_PROVIDER_SITE_OTHER): Payer: BC Managed Care – PPO | Admitting: Physician Assistant

## 2019-02-15 VITALS — Temp 99.1°F | Ht 61.0 in | Wt 170.0 lb

## 2019-02-15 DIAGNOSIS — J208 Acute bronchitis due to other specified organisms: Secondary | ICD-10-CM | POA: Diagnosis not present

## 2019-02-15 DIAGNOSIS — B9689 Other specified bacterial agents as the cause of diseases classified elsewhere: Secondary | ICD-10-CM | POA: Diagnosis not present

## 2019-02-15 MED ORDER — AZITHROMYCIN 250 MG PO TABS: ORAL_TABLET | ORAL | 0 refills | Status: DC

## 2019-02-15 MED ORDER — BENZONATATE 100 MG PO CAPS: 100.0000 mg | ORAL_CAPSULE | Freq: Three times a day (TID) | ORAL | 0 refills | Status: DC | PRN

## 2019-02-15 NOTE — Patient Instructions (Signed)
Take antibiotic (Azithromycin) as directed.  Increase fluids.  Get plenty of rest. Use Mucinex for congestion. Tessalon for cough. Take a daily probiotic (I recommend Align or Culturelle, but even Activia Yogurt may be beneficial).  A humidifier placed in the bedroom may offer some relief for a dry, scratchy throat of nasal irritation.  Read information below on acute bronchitis. Please call or return to clinic if symptoms are not improving.   Bronquitis aguda en los adultos Acute Bronchitis, Adult  La bronquitis aguda es la hinchazn repentina (aguda) de las vas areas (bronquios) en los pulmones. La bronquitis aguda hace que se llenen estas vas con mucosidad y provoca dificultad para respirar. Tambin puede causar tos o sibilancias. En los adultos, la bronquitis aguda generalmente desaparece en 2semanas. La tos provocada por la bronquitis puede durar [employer]. El hbito de fumar, las Environmental consultant y el asma pueden empeorar esta afeccin. Los episodios repetidos de bronquitis pueden causar ms problemas pulmonares, como la enfermedad pulmonar obstructiva crnica (EPOC). Cules son las causas? Esta afeccin puede ser causada por grmenes y por sustancias que irritan los pulmones, por ejemplo:  Virus del resfro y de la gripe. La causa de la bronquitis suele ser el mismo virus que produce el resfro.  Bacterias.  Exposicin al humo del tabaco, polvo, gases y contaminacin del aire. Qu incrementa el riesgo? Es ms probable que Dietitian en las personas que:  Tienen contacto cercano con alguien con bronquitis aguda.  Estn expuestos a sustancias que [employer], como el humo del [place], [place], gases y vapores.  Tienen un sistema inmunitario dbil.  Tienen una afeccin respiratoria, como el asma. Cules son los signos o sntomas? Los sntomas de esta afeccin [employer] siguientes:  Tos.  Despedir Neomia Dear mucosidad transparente, amarilla o verde al  toser.  Sibilancias.  Congestin en el pecho.  Falta de aire.  Grant Ruts.  Dolores [employer]epsi[employer] cuerpo.  Escalofros.  Dolor de Advertising copywriter. Cmo se diagnostica? Por lo general, esta afeccin se diagnostica mediante un examen fsico. Durante el examen, el mdico puede solicitar estudios, como radiografas de trax, para Administrator. El mdico tambin puede hacer lo siguiente:  Danna Hefty de mucosidad para detectar una infeccin bacteriana.  Confirmar el nivel de oxgeno en la sangre. Esto se hace para determinar si hay neumona.  Realizar una radiografa de trax o pruebas de la funcin pulmonar para descartar una neumona u otras afecciones.  Realizar anlisis de [place]. El mdico tambin le preguntar sobre sus sntomas y los antecedentes mdicos. Cmo se trata? La [employer] de los casos de bronquitis aguda se recupera con el [place], sin tratamiento. El mdico puede recomendarle lo siguiente:  Beber ms lquidos. Beber ms cantidad de lquidos ayuda a diluir la mucosidad para Research officer, political party respiracin.  Tomar un medicamento para la fiebre o la tos.  Tomar un antibitico.  Usar un inhalador para respirar mejor y Scientist, physiological tos.  Usar un humidificador o vaporizador de niebla fra para facilitar la respiracin. Siga estas indicaciones en su casa: Medicamentos  [employer] de venta libre y los recetados solamente como se lo haya indicado el mdico.  Si le recetaron un antibitico, tmelo como se lo haya indicado el mdico. No deje de tomar el antibitico aunque comience a sentirse mejor. Instrucciones generales   Descanse lo suficiente.  Beba suficiente lquido para Photographer orina de color amarillo plido.  Evite fumar o aspirar el humo de otros fumadores. La exposicin al humo  del cigarrillo o a irritantes qumicos har que la bronquitis empeore. Si fuma y necesita ayuda para dejar de fumar, consulte al mdico. Si deja de fumar, sus pulmones  se curarn ms rpido.  Utilice Educational psychologist, un humidificador o un vaporizador de niebla fra, como se lo haya indicado el mdico.  Concurra a todas las visitas de control como se lo haya indicado el mdico. Esto es importante. Cmo se evita? Para disminuir el riesgo de volver a sufrir esta afeccin:  Lvese las manos frecuentemente con agua y [place]. Use desinfectante para manos si no dispone de [place] y [place].  Evite el contacto con personas que tienen sntomas de resfro.  Trate de no llevar las manos a la boca, la nariz o los ojos.  Recuerde aplicarse la vacuna contra la gripe todos los [place]. Comunquese con un mdico si:  Los sntomas no mejoran despus de 2semanas de tratamiento. Solicite ayuda de inmediato si:  Tose y [employer].  Siente dolor en el pecho.  Le falta el aire de manera preocupante.  Se deshidrata.  Se desmaya o se siente como si se fuera a desmayar.  Sigue vomitando.  Tiene un dolor de cabeza intenso.  La fiebre o los escalofros empeoran. Esta informacin no tiene Marine scientist el consejo del mdico. Asegrese de hacerle al mdico cualquier pregunta que tenga. Document Released: 03/24/2005 Document Revised: 01/08/2017 Document Reviewed: 09/12/2015 Elsevier Patient Education  2020 [employer].

## 2019-02-15 NOTE — Progress Notes (Signed)
I have discussed the procedure for the virtual visit with the patient who has given consent to proceed with assessment and treatment.   Soni Kegel S Lauri Purdum, CMA     

## 2019-02-15 NOTE — Progress Notes (Signed)
   Virtual Visit via Video   I connected with patient on 02/15/19 at  2:00 PM EST by a video enabled telemedicine application and verified that I am speaking with the correct person using two identifiers.  Location patient: Home Location provider: [facility], Office Persons participating in the virtual visit: Patient, Provider, PA-Student Drucilla Schmidt), Ida (Patina Laurance Flatten), Interpretor Plessis)  I discussed the limitations of evaluation and management by telemedicine and the availability of in person appointments. The patient expressed understanding and agreed to proceed.  Subjective:   HPI:   Patient presents via Doxy.Me today with the help of Interpreting Services to re-establish care. Patient has not been seen in 5 years. Patient with acute concerns today.   Patient endorses dry cough. Symptoms began 4 days after she had the flu shot (~10/15). Father tested positive for COVID, patient tested negative on 02/01/19. Cough has become more constant, associated with pain around breast area, radiates to back. Notes chills, chest pressure and tightness when she coughs, fatigue. Has taken Nyquil PM, helps her sleep but does not help with the cough. Denies being around anyone else with similar symptoms.  Denies fever, congestion, rhinorrhea, sinus pressure. Denies coughing up sputum. No history of seasonal allergies.    ROS:   See pertinent positives and negatives per HPI.  Patient Active Problem List   Diagnosis Date Noted  . Dysmenorrhea 03/13/2014  . Menorrhagia with regular cycle 03/13/2014  . IUD (intrauterine device) in place 12/28/2013  . Family history of breast cancer in female 07/14/2013  . H. pylori infection 06/23/2013  . GERD (gastroesophageal reflux disease) 06/22/2013  . Visit for preventive health examination 06/22/2013  . ANEMIA, MILD 07/24/2008    Social History   Tobacco Use  . Smoking status: Light Tobacco Smoker  . Smokeless tobacco: Never Used   Substance Use Topics  . Alcohol use: Yes    Comment: socially   No current outpatient medications on file.  Current Facility-Administered Medications:  .  levonorgestrel (MIRENA) 20 MCG/24HR IUD, , Intrauterine, Once, Terrance Mass, MD  No Known Allergies  Objective:   There were no vitals taken for this visit.  Patient is well-developed, well-nourished in no acute distress.  Resting comfortably at home.  Head is normocephalic, atraumatic.  No labored breathing.  Speech is clear and coherent with logical content.  Patient is alert and oriented at baseline.   Assessment and Plan:   1. Acute bacterial bronchitis Rx Azithromycin.  Increase fluids.  Rest.  Saline nasal spray.  Probiotic.  Mucinex as directed.  Humidifier in bedroom. Tessalon per orders.   Call or return to clinic if symptoms are not improving.     Leeanne Rio, PA-C 02/15/2019

## 2019-03-09 ENCOUNTER — Other Ambulatory Visit: Payer: Self-pay | Admitting: Obstetrics & Gynecology

## 2019-03-09 DIAGNOSIS — Z1231 Encounter for screening mammogram for malignant neoplasm of breast: Secondary | ICD-10-CM

## 2019-04-22 ENCOUNTER — Other Ambulatory Visit: Payer: Self-pay

## 2019-04-25 ENCOUNTER — Encounter: Payer: Self-pay | Admitting: Obstetrics & Gynecology

## 2019-04-25 ENCOUNTER — Ambulatory Visit (INDEPENDENT_AMBULATORY_CARE_PROVIDER_SITE_OTHER): Payer: BC Managed Care – PPO | Admitting: Obstetrics & Gynecology

## 2019-04-25 ENCOUNTER — Other Ambulatory Visit: Payer: Self-pay

## 2019-04-25 VITALS — BP 126/84 | Ht 62.0 in | Wt 176.0 lb

## 2019-04-25 DIAGNOSIS — Z01419 Encounter for gynecological examination (general) (routine) without abnormal findings: Secondary | ICD-10-CM | POA: Diagnosis not present

## 2019-04-25 DIAGNOSIS — Z6832 Body mass index (BMI) 32.0-32.9, adult: Secondary | ICD-10-CM

## 2019-04-25 DIAGNOSIS — E786 Lipoprotein deficiency: Secondary | ICD-10-CM | POA: Diagnosis not present

## 2019-04-25 DIAGNOSIS — Z13220 Encounter for screening for lipoid disorders: Secondary | ICD-10-CM | POA: Diagnosis not present

## 2019-04-25 DIAGNOSIS — E6609 Other obesity due to excess calories: Secondary | ICD-10-CM

## 2019-04-25 DIAGNOSIS — E559 Vitamin D deficiency, unspecified: Secondary | ICD-10-CM | POA: Diagnosis not present

## 2019-04-25 DIAGNOSIS — Z30431 Encounter for routine checking of intrauterine contraceptive device: Secondary | ICD-10-CM | POA: Diagnosis not present

## 2019-04-25 MED ORDER — PHENTERMINE HCL 37.5 MG PO CAPS: 37.5000 mg | ORAL_CAPSULE | ORAL | 0 refills | Status: DC

## 2019-04-25 NOTE — Progress Notes (Signed)
  [name] 03/28/1981 [id number]   History:    39 y.o. G1P1L1  Married  RP:  Established patient presenting for annual gyn exam  HPI:  Menses normal every month.  Well on Paragard IUD x 12/2013.  No pelvic pain.  Normal secretions.  Breasts normal currently, occasional bumps coming and going in left breast.  Mother deceased from Breast Ca Dxed at 44 yo.  Patient had genetic testing which was negative.  Mictions/BMs wnl.  BMI increased x last year, now 32.19.  Struggling to loose weight.  Very little vegetables/proteins.  Works many hours in the airline industry.  Past medical history,surgical history, family history and social history were all reviewed and documented in the EPIC chart.  Gynecologic History No LMP recorded. (Menstrual status: IUD).  Obstetric History OB History  Gravida Para Term Preterm AB Living  1 1       1  SAB TAB Ectopic Multiple Live Births               # Outcome Date GA Lbr Len/2nd Weight Sex Delivery Anes PTL Lv  1 Para              ROS: A ROS was performed and pertinent positives and negatives are included in the history.  GENERAL: No fevers or chills. HEENT: No change in vision, no earache, sore throat or sinus congestion. NECK: No pain or stiffness. CARDIOVASCULAR: No chest pain or pressure. No palpitations. PULMONARY: No shortness of breath, cough or wheeze. GASTROINTESTINAL: No abdominal pain, nausea, vomiting or diarrhea, melena or bright red blood per rectum. GENITOURINARY: No urinary frequency, urgency, hesitancy or dysuria. MUSCULOSKELETAL: No joint or muscle pain, no back pain, no recent trauma. DERMATOLOGIC: No rash, no itching, no lesions. ENDOCRINE: No polyuria, polydipsia, no heat or cold intolerance. No recent change in weight. HEMATOLOGICAL: No anemia or easy bruising or bleeding. NEUROLOGIC: No headache, seizures, numbness, tingling or weakness. PSYCHIATRIC: No depression, no loss of interest in normal activity or change in sleep pattern.      Exam:   BP 126/84   Ht 5' 2" (1.575 m)   Wt 176 lb (79.8 kg)   BMI 32.19 kg/m   Body mass index is 32.19 kg/m.  General appearance : Well developed well nourished female. No acute distress HEENT: Eyes: no retinal hemorrhage or exudates,  Neck supple, trachea midline, no carotid bruits, no thyroidmegaly Lungs: Clear to auscultation, no rhonchi or wheezes, or rib retractions  Heart: Regular rate and rhythm, no murmurs or gallops Breast:Examined in sitting and supine position were symmetrical in appearance, no palpable masses or tenderness,  no skin retraction, no nipple inversion, no nipple discharge, no skin discoloration, no axillary or supraclavicular lymphadenopathy Abdomen: no palpable masses or tenderness, no rebound or guarding Extremities: no edema or skin discoloration or tenderness  Pelvic: Vulva: Normal             Vagina: No gross lesions or discharge  Cervix: No gross lesions or discharge.  Pap reflex test done.  IUD strings short but felt at EO on bimanual exam.  Uterus  AV, normal size, shape and consistency, non-tender and mobile  Adnexa  Without masses or tenderness  Anus: Normal   Assessment/Plan:  39 y.o. female for annual exam   1. Encounter for routine gynecological examination with Papanicolaou smear of cervix Normal gynecologic exam.  Pap reflex done today.  Breast exam normal.  Fasting health labs here today. - CBC - Comp Met (CMET) -   TSH - Lipid panel - VITAMIN D 25 Hydroxy (Vit-D Deficiency, Fractures)  2. Encounter for routine checking of intrauterine contraceptive device (IUD) ParaGard IUD since September 2015.  Well-tolerated and in good position.  3. Class 1 obesity due to excess calories without serious comorbidity with body mass index (BMI) of 32.0 to 32.9 in adult Recommend a lower calorie/carb diet such as [employer].  Intermittent fasting discussed.  Aerobic physical activities 5 times a week and light weightlifting every 2 days  recommended.  Patient requesting phentermine.  No contraindication.  Decision to prescribe for 1 months to help with compliance to a lower calorie diet.  Usage reviewed and prescription sent to pharmacy.  Other orders - phentermine 37.5 MG capsule; Take 1 capsule (37.5 mg total) by mouth every morning.  Princess Bruins MD, 8:40 AM 04/25/2019

## 2019-04-25 NOTE — Patient Instructions (Signed)
1. Encounter for routine gynecological examination with Papanicolaou smear of cervix Normal gynecologic exam.  Pap reflex done today.  Breast exam normal.  Fasting health labs here today. - CBC - Comp Met (CMET) - TSH - Lipid panel - VITAMIN D 25 Hydroxy (Vit-D Deficiency, Fractures)  2. Encounter for routine checking of intrauterine contraceptive device (IUD) ParaGard IUD since September 2015.  Well-tolerated and in good position.  3. Class 1 obesity due to excess calories without serious comorbidity with body mass index (BMI) of 32.0 to 32.9 in adult Recommend a lower calorie/carb diet such as [employer].  Intermittent fasting discussed.  Aerobic physical activities 5 times a week and light weightlifting every 2 days recommended.  Patient requesting phentermine.  No contraindication.  Decision to prescribe for 1 months to help with compliance to a lower calorie diet.  Usage reviewed and prescription sent to pharmacy.  Other orders - phentermine 37.5 MG capsule; Take 1 capsule (37.5 mg total) by mouth every morning.  Shamon, it was a pleasure seeing you today!  I will inform you of your results as soon as they are available.

## 2019-04-26 LAB — CBC
HCT: 38.3 % (ref 35.0–45.0)
Hemoglobin: 12.8 g/dL (ref 11.7–15.5)
MCH: 30.4 pg (ref 27.0–33.0)
MCHC: 33.4 g/dL (ref 32.0–36.0)
MCV: 91 fL (ref 80.0–100.0)
MPV: 10.9 fL (ref 7.5–12.5)
Platelets: 247 10*3/uL (ref 140–400)
RBC: 4.21 10*6/uL (ref 3.80–5.10)
RDW: 12.2 % (ref 11.0–15.0)
WBC: 4.9 10*3/uL (ref 3.8–10.8)

## 2019-04-26 LAB — COMPREHENSIVE METABOLIC PANEL
AG Ratio: 1.8 (calc) (ref 1.0–2.5)
ALT: 12 U/L (ref 6–29)
AST: 11 U/L (ref 10–30)
Albumin: 4.2 g/dL (ref 3.6–5.1)
Alkaline phosphatase (APISO): 53 U/L (ref 31–125)
BUN: 12 mg/dL (ref 7–25)
CO2: 27 mmol/L (ref 20–32)
Calcium: 8.6 mg/dL (ref 8.6–10.2)
Chloride: 107 mmol/L (ref 98–110)
Creat: 0.58 mg/dL (ref 0.50–1.10)
Globulin: 2.3 g/dL (calc) (ref 1.9–3.7)
Glucose, Bld: 99 mg/dL (ref 65–99)
Potassium: 3.5 mmol/L (ref 3.5–5.3)
Sodium: 139 mmol/L (ref 135–146)
Total Bilirubin: 0.4 mg/dL (ref 0.2–1.2)
Total Protein: 6.5 g/dL (ref 6.1–8.1)

## 2019-04-26 LAB — PAP IG W/ RFLX HPV ASCU

## 2019-04-26 LAB — TSH: TSH: 4.52 mIU/L — ABNORMAL HIGH

## 2019-04-26 LAB — VITAMIN D 25 HYDROXY (VIT D DEFICIENCY, FRACTURES): Vit D, 25-Hydroxy: 9 ng/mL — ABNORMAL LOW (ref 30–100)

## 2019-04-26 LAB — LIPID PANEL
Cholesterol: 150 mg/dL (ref <200)
HDL: 42 mg/dL — ABNORMAL LOW (ref >=50)
LDL Cholesterol (Calc): 90 mg/dL (calc)
Non-HDL Cholesterol (Calc): 108 mg/dL (calc) (ref <130)
Total CHOL/HDL Ratio: 3.6 (calc) (ref <5.0)
Triglycerides: 89 mg/dL (ref <150)

## 2019-04-28 ENCOUNTER — Other Ambulatory Visit: Payer: Self-pay

## 2019-04-28 ENCOUNTER — Ambulatory Visit
Admission: RE | Admit: 2019-04-28 | Discharge: 2019-04-28 | Disposition: A | Discharge status: Home / Self Care | Payer: BC Managed Care – PPO | Source: Ambulatory Visit | Attending: Obstetrics & Gynecology | Admitting: Obstetrics & Gynecology

## 2019-04-28 DIAGNOSIS — Z1231 Encounter for screening mammogram for malignant neoplasm of breast: Secondary | ICD-10-CM

## 2019-04-28 IMAGING — MG DIGITAL SCREENING BILAT W/ TOMO W/ CAD
8 series · 8 of 24 positions shown · non-contrast
Comparison: Previous exam(s).

CLINICAL DATA: Screening.

EXAM:
DIGITAL SCREENING BILATERAL MAMMOGRAM WITH TOMO AND CAD

[R CC synth-2D]
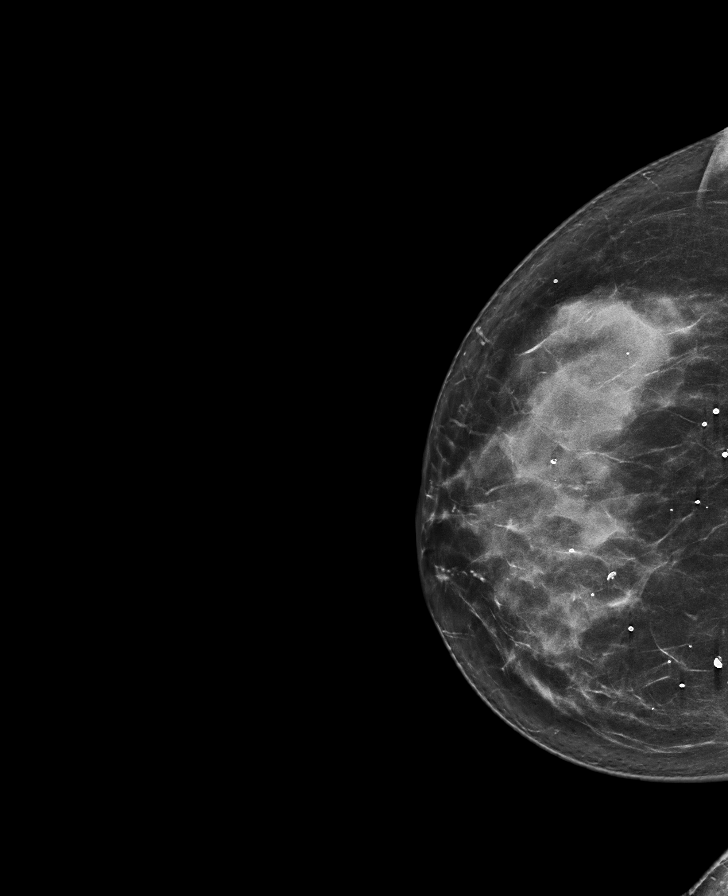

[L CC synth-2D]
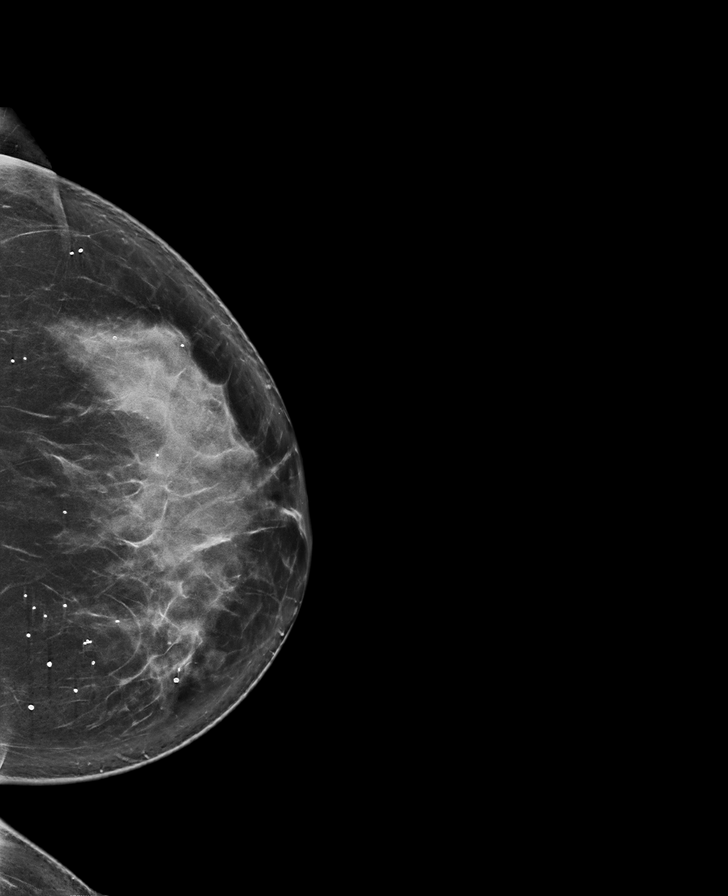

[R MLO synth-2D]
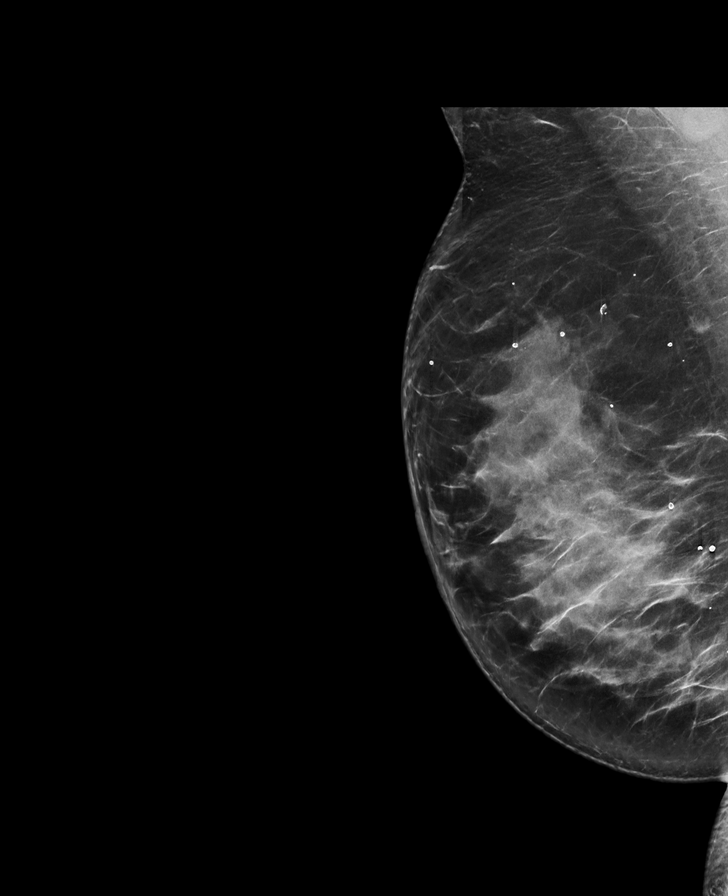

[L MLO synth-2D]
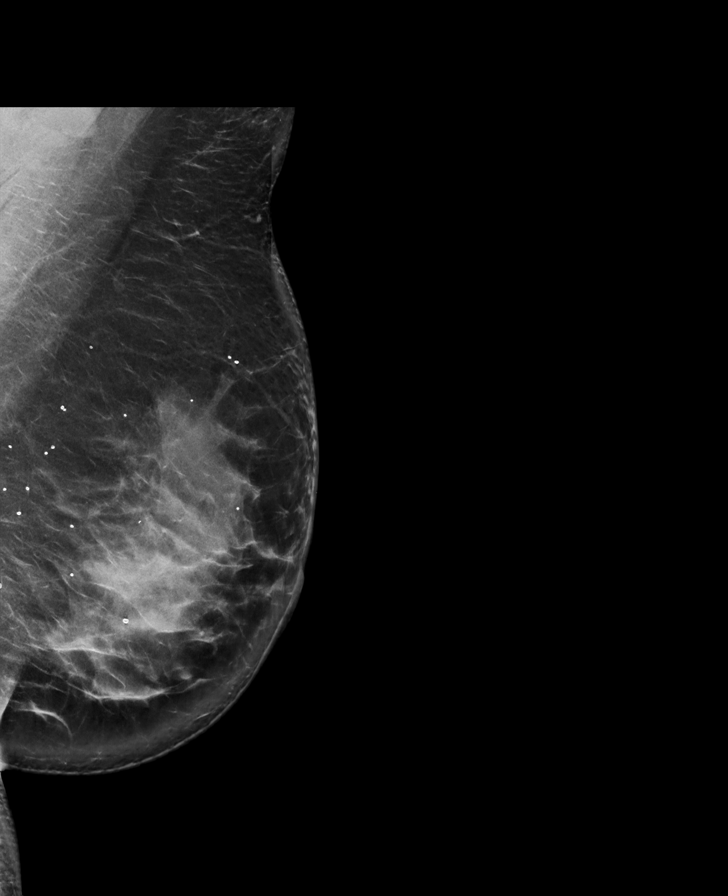

[L CC tomo · tomo slice 45/88.0]
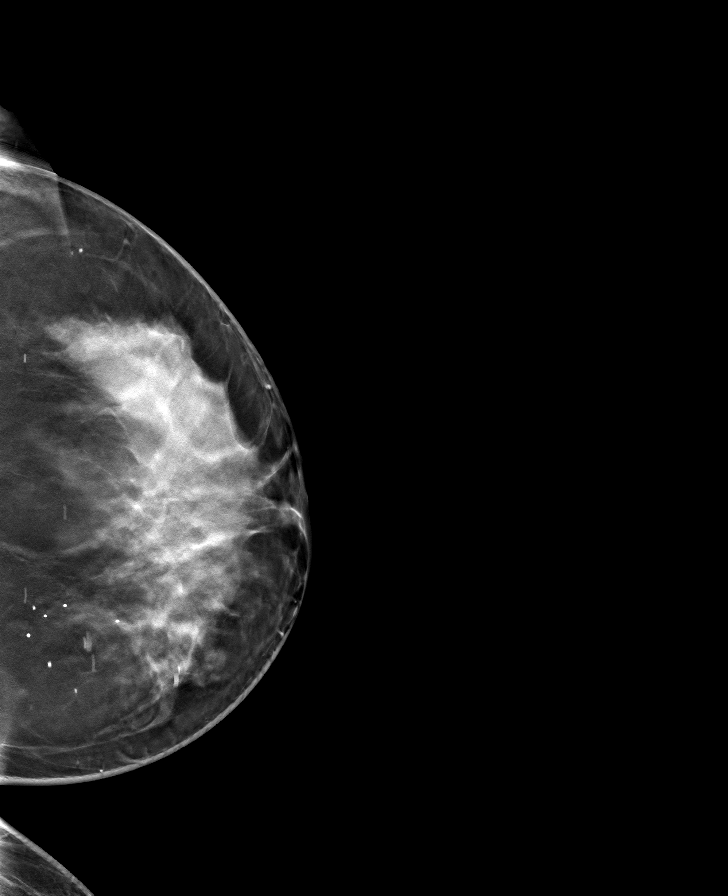

[L MLO tomo · tomo slice 53/105.0]
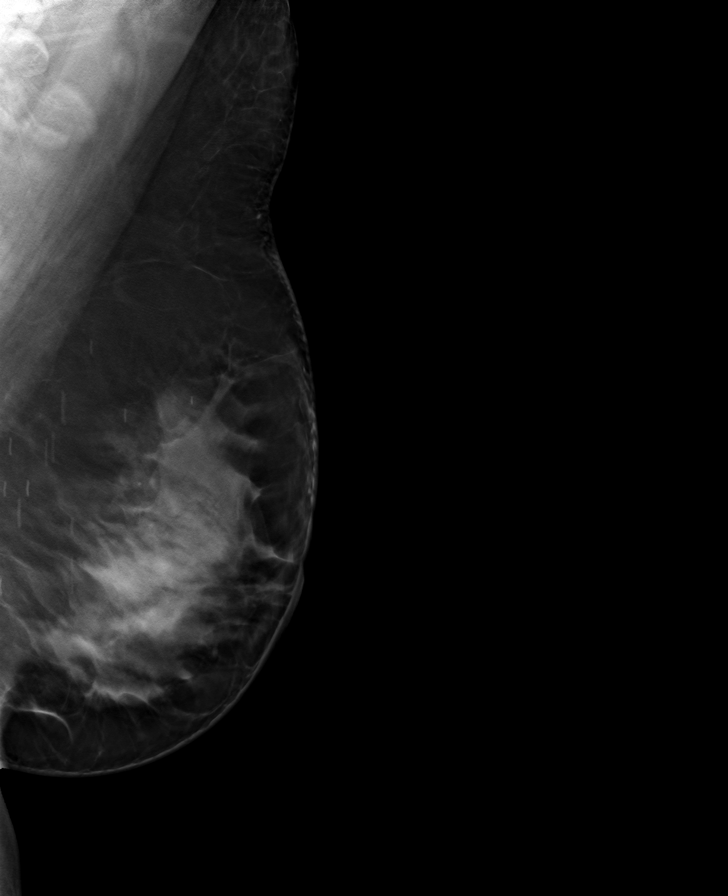

[R CC tomo · tomo slice 43/85.0]
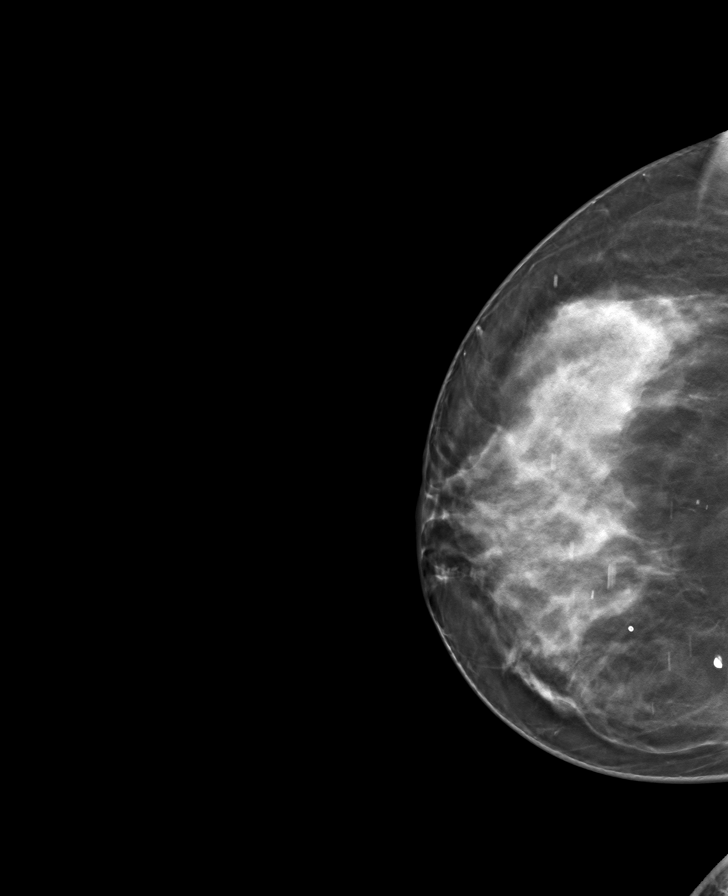

[R MLO tomo · tomo slice 48/95.0]
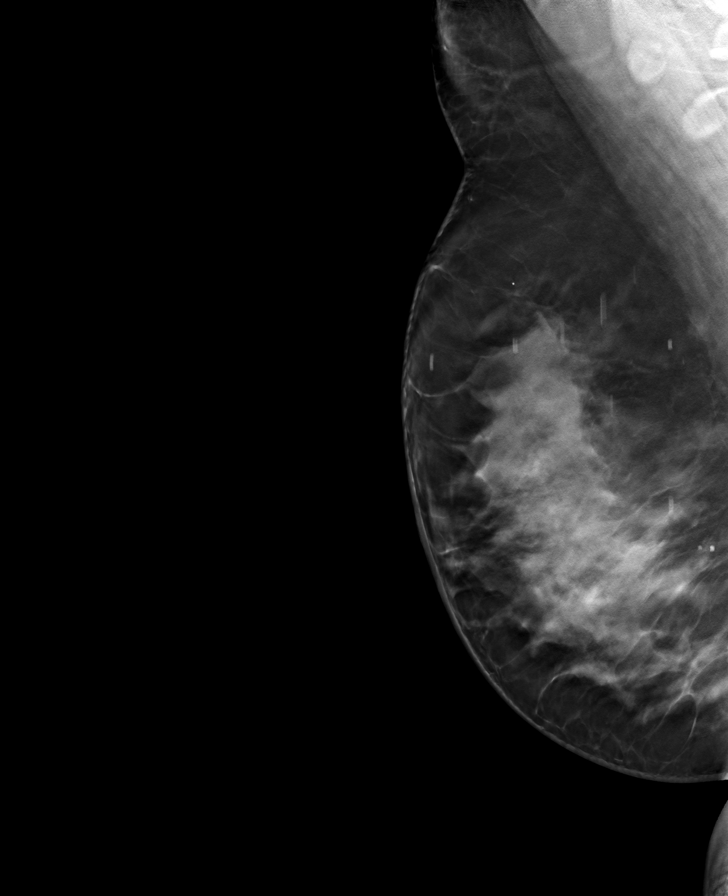

[8 of 24 positions shown; findings below may reference images not displayed]

ACR Breast Density Category d: The breast tissue is extremely dense,
which lowers the sensitivity of mammography
FINDINGS: There are no findings suspicious for malignancy. Images were
processed with CAD.
IMPRESSION: No mammographic evidence of malignancy. A result letter of this
screening mammogram will be mailed directly to the patient.

RECOMMENDATION:
Screening mammogram at age 42. (Code:[48])

BI-RADS CATEGORY  1: Negative.

## 2019-05-05 NOTE — Telephone Encounter (Signed)
Patient did have genetic counseling appointment and on 09/01/2013 after receiving negative BRACA 1 and 2 results:   "CANCER SCREENING: This normal result is reassuring and indicates that [name] does not likely have an increased risk of cancer due to a mutation in one of these genes. We recommended [name] continue to follow the cancer screening guidelines provided by her primary physician. We discussed that initiating mammograms at age 39 is reasonable as it is 10 years younger than the youngest breast cancer in the family  Her mammo in 2019 and 2021 were normal.

## 2019-05-23 ENCOUNTER — Telehealth: Payer: Self-pay | Admitting: *Deleted

## 2019-05-23 NOTE — Telephone Encounter (Signed)
Spanish speaking patient called and spoke with [facility] requesting refill on phetermine 37.5 mg cap. Last prescribed on 04/25/19 " Recommend a lower calorie/carb diet such as [employer].  Intermittent fasting discussed.  Aerobic physical activities 5 times a week and light weightlifting every 2 days recommended.  Patient requesting phentermine.  No contraindication.  Decision to prescribe for 1 months to help with compliance to a lower calorie diet.  Usage reviewed and prescription sent to pharmacy."

## 2019-05-25 MED ORDER — PHENTERMINE HCL 37.5 MG PO CAPS: 37.5000 mg | ORAL_CAPSULE | ORAL | 0 refills | Status: DC

## 2019-05-25 NOTE — Telephone Encounter (Signed)
Rx called in, will route to [facility] to relay to patient and have her schedule OV in 3 months for follow up.

## 2019-05-25 NOTE — Telephone Encounter (Signed)
Agree with Phentermine x 2 additional months #60, no refill after that.  Schedule a weight management visit in 3 months.

## 2019-05-25 NOTE — Telephone Encounter (Signed)
Patient informed and appointment made for May 24.

## 2019-08-29 ENCOUNTER — Ambulatory Visit: Payer: BC Managed Care – PPO | Admitting: Obstetrics & Gynecology

## 2019-11-04 ENCOUNTER — Other Ambulatory Visit: Payer: BC Managed Care – PPO

## 2020-04-04 DIAGNOSIS — Z20828 Contact with and (suspected) exposure to other viral communicable diseases: Secondary | ICD-10-CM | POA: Diagnosis not present

## 2020-04-30 ENCOUNTER — Encounter: Payer: BC Managed Care – PPO | Admitting: Obstetrics & Gynecology

## 2020-06-01 ENCOUNTER — Ambulatory Visit: Payer: BC Managed Care – PPO | Admitting: Obstetrics & Gynecology

## 2020-06-06 ENCOUNTER — Other Ambulatory Visit: Payer: Self-pay | Admitting: Obstetrics & Gynecology

## 2020-06-06 DIAGNOSIS — Z1231 Encounter for screening mammogram for malignant neoplasm of breast: Secondary | ICD-10-CM

## 2020-07-16 ENCOUNTER — Other Ambulatory Visit: Payer: Self-pay

## 2020-07-16 ENCOUNTER — Ambulatory Visit (INDEPENDENT_AMBULATORY_CARE_PROVIDER_SITE_OTHER): Payer: BC Managed Care – PPO | Admitting: Obstetrics & Gynecology

## 2020-07-16 ENCOUNTER — Encounter: Payer: Self-pay | Admitting: Obstetrics & Gynecology

## 2020-07-16 ENCOUNTER — Ambulatory Visit: Payer: BC Managed Care – PPO | Admitting: Obstetrics & Gynecology

## 2020-07-16 VITALS — BP 126/80 | Ht 62.0 in | Wt 161.0 lb

## 2020-07-16 DIAGNOSIS — N6315 Unspecified lump in the right breast, overlapping quadrants: Secondary | ICD-10-CM | POA: Diagnosis not present

## 2020-07-16 DIAGNOSIS — R200 Anesthesia of skin: Secondary | ICD-10-CM | POA: Diagnosis not present

## 2020-07-16 DIAGNOSIS — Z01419 Encounter for gynecological examination (general) (routine) without abnormal findings: Secondary | ICD-10-CM | POA: Diagnosis not present

## 2020-07-16 DIAGNOSIS — E663 Overweight: Secondary | ICD-10-CM

## 2020-07-16 DIAGNOSIS — Z975 Presence of (intrauterine) contraceptive device: Secondary | ICD-10-CM

## 2020-07-16 MED ORDER — PHENTERMINE HCL 37.5 MG PO CAPS: 37.5000 mg | ORAL_CAPSULE | ORAL | 2 refills | Status: DC

## 2020-07-16 NOTE — Progress Notes (Signed)
[name] 1980-05-06 [id number]   History:    40 y.o. G1P1L1 Married  RP: Established patient presentingfor annual gyn exam  HPI: Menses normal every month. Well on Paragard IUD x 12/2013. No pelvic pain. Normal secretions. Breasts normal currently. Mother deceased from Breast Ca Dxed at 31 yo. Patient had genetic testing which was negative. Mictions/BMs wnl.  C/O Rt arm numbness. BMI 29.45 improved with Phentermine last year, wants to take it again. Works many hours in [employer]  Past medical history,surgical history, family history and social history were all reviewed and documented in the EPIC chart.  Gynecologic History Patient's last menstrual period was 07/12/2020.  Obstetric History OB History  Gravida Para Term Preterm AB Living  1 1       1   SAB IAB Ectopic Multiple Live Births               # Outcome Date GA Lbr Len/2nd Weight Sex Delivery Anes PTL Lv  1 Para              ROS: A ROS was performed and pertinent positives and negatives are included in the history.  GENERAL: No fevers or chills. HEENT: No change in vision, no earache, sore throat or sinus congestion. NECK: No pain or stiffness. CARDIOVASCULAR: No chest pain or pressure. No palpitations. PULMONARY: No shortness of breath, cough or wheeze. GASTROINTESTINAL: No abdominal pain, nausea, vomiting or diarrhea, melena or bright red blood per rectum. GENITOURINARY: No urinary frequency, urgency, hesitancy or dysuria. MUSCULOSKELETAL: No joint or muscle pain, no back pain, no recent trauma. DERMATOLOGIC: No rash, no itching, no lesions. ENDOCRINE: No polyuria, polydipsia, no heat or cold intolerance. No recent change in weight. HEMATOLOGICAL: No anemia or easy bruising or bleeding. NEUROLOGIC: No headache, seizures, numbness, tingling or weakness. PSYCHIATRIC: No depression, no loss of interest in normal activity or change in sleep pattern.     Exam:   BP 126/80   Ht 5\' 2"  (1.575 m)    Wt 161 lb (73 kg)   LMP 07/12/2020 Comment: PARAGARD 12/2013  BMI 29.45 kg/m   Body mass index is 29.45 kg/m.  General appearance : Well developed well nourished female. No acute distress HEENT: Eyes: no retinal hemorrhage or exudates,  Neck supple, trachea midline, no carotid bruits, no thyroidmegaly Lungs: Clear to auscultation, no rhonchi or wheezes, or rib retractions  Heart: Regular rate and rhythm, no murmurs or gallops Breast:Examined in sitting and supine position were symmetrical in appearance, Left no palpable masses or tenderness, Rt with a small mobile nodule at 6-7 O'clock,  no skin retraction, no nipple inversion, no nipple discharge, no skin discoloration, no axillary or supraclavicular lymphadenopathy Abdomen: no palpable masses or tenderness, no rebound or guarding Extremities: no edema or skin discoloration or tenderness  Pelvic: Vulva: Normal             Vagina: No gross lesions or discharge  Cervix: No gross lesions or discharge.  Strings felt at the exocervix.  Uterus  AV, normal size, shape and consistency, non-tender and mobile  Adnexa  Without masses or tenderness  Anus: Normal   Assessment/Plan:  40 y.o. female for annual exam   1. Well female exam with routine gynecological exam Normal gynecologic exam.  No indication for Pap test this year.  Breast exam normal.  Doing screening mammogram annually because of family history of breast cancer.  Health labs with family PA.   2. IUD (intrauterine device) in place Well  on ParaGard since 2015.  ParaGard IUD in good location.  3. Breast lump on right side at 6 o'clock position Small probably benign nodule on right breast at 6-7 o'clock.  Right diagnostic mammogram with ultrasound.  Will do screening mammogram on the left side.  4. Right arm numbness Refer to neurology.  5. Overweight (BMI 25.0-29.9) Recommend a lower calorie/carb diet.  Intermittent fasting discussed.  Aerobic activities 5 times a week and  light weightlifting every 2 days.  Successful with phentermine last year.  Prescription of phentermine sent to pharmacy.  Usage known.  Other orders - phentermine 37.5 MG capsule; Take 1 capsule (37.5 mg total) by mouth every morning.  Genia Del MD, 3:22 PM 07/16/2020

## 2020-07-17 ENCOUNTER — Telehealth: Payer: Self-pay | Admitting: *Deleted

## 2020-07-17 DIAGNOSIS — N6315 Unspecified lump in the right breast, overlapping quadrants: Secondary | ICD-10-CM

## 2020-07-17 DIAGNOSIS — R200 Anesthesia of skin: Secondary | ICD-10-CM

## 2020-07-17 NOTE — Telephone Encounter (Signed)
Patient called back in voice mail. Message sent to [place] asking her to return patient's call. Elane Fritz viewed message.

## 2020-07-17 NOTE — Telephone Encounter (Signed)
-----   Message from Genia Del, MD sent at 07/16/2020  3:35 PM EDT ----- Regarding: Refer to Neurologist and Rt Dx mammo/US Numbing/decreased sensation in the Rt arm with pain in fingers.  Refer to Neuro.  Rt breast with a small lump at 6-7 O'Clock.  Rt Dx Mammo/US.  Pt has a screening mammo scheduled 4/27th.  Please try to have all at once.

## 2020-07-17 NOTE — Telephone Encounter (Signed)
Patient was informed of appointment information with the [facility] also provided their telephone number if she would like to change her appointment date and time. Also informed that a referral was made to [facility] and they will be calling her to schedule an appointment.

## 2020-07-17 NOTE — Telephone Encounter (Signed)
1. Referral placed at [facility] they will call patient to schedule.  2. Orders placed at the breast center of [facility] for Dx mammogram and right breast ultrasound, patient scheduled on 08/21/20 @ 1:20pm. Her mammogram was canceled due to new breast problem. This was the next available appointment they have as of now. If patient has someone who speaks english they can call daily/weekly to the breast center to check for any cancellations at [phone]  I will route encounter to [place] to relay in spanish to patient.

## 2020-07-31 ENCOUNTER — Other Ambulatory Visit: Payer: Self-pay

## 2020-07-31 ENCOUNTER — Ambulatory Visit
Admission: RE | Admit: 2020-07-31 | Discharge: 2020-07-31 | Disposition: A | Discharge status: Home / Self Care | Payer: BC Managed Care – PPO | Source: Ambulatory Visit | Attending: Obstetrics & Gynecology | Admitting: Obstetrics & Gynecology

## 2020-07-31 ENCOUNTER — Other Ambulatory Visit: Payer: Self-pay | Admitting: Obstetrics & Gynecology

## 2020-07-31 DIAGNOSIS — N6313 Unspecified lump in the right breast, lower outer quadrant: Secondary | ICD-10-CM | POA: Diagnosis not present

## 2020-07-31 DIAGNOSIS — R922 Inconclusive mammogram: Secondary | ICD-10-CM | POA: Diagnosis not present

## 2020-07-31 DIAGNOSIS — N6315 Unspecified lump in the right breast, overlapping quadrants: Secondary | ICD-10-CM

## 2020-07-31 DIAGNOSIS — Z803 Family history of malignant neoplasm of breast: Secondary | ICD-10-CM | POA: Diagnosis not present

## 2020-07-31 IMAGING — MG DIGITAL DIAGNOSTIC BILAT W/ TOMO W/ CAD
6 of 10 series · 6 of 30 positions shown · non-contrast
Comparison: Previous exam(s).

CLINICAL DATA: 39-year-old female with a physician palpated right
breast lump. Very strong family history of breast cancer with mother
diagnosed at age 44 and grandmother at age 49.

EXAM:
DIGITAL DIAGNOSTIC BILATERAL MAMMOGRAM WITH TOMOSYNTHESIS AND CAD;
ULTRASOUND RIGHT BREAST LIMITED
TECHNIQUE: Bilateral digital diagnostic mammography and breast tomosynthesis
was performed. The images were evaluated with computer-aided
detection.; Targeted ultrasound examination of the right breast was
performed

[R CC synth-2D]
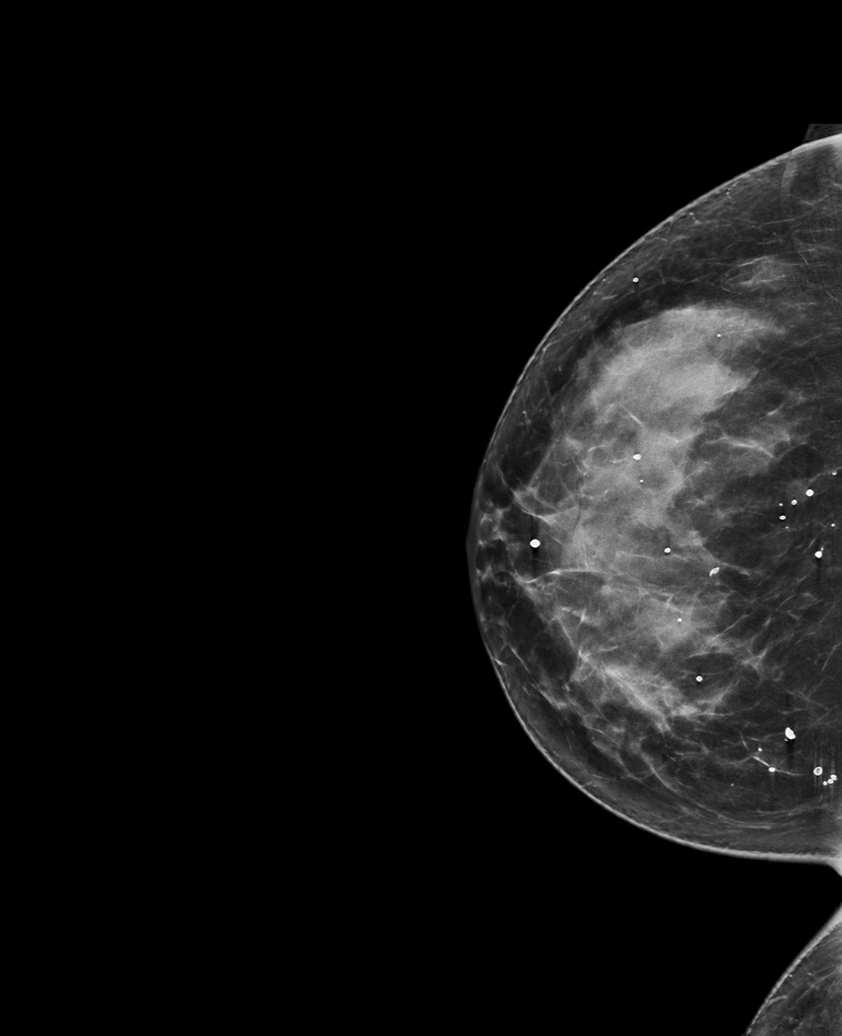

[R MLO synth-2D (1 of 2)]
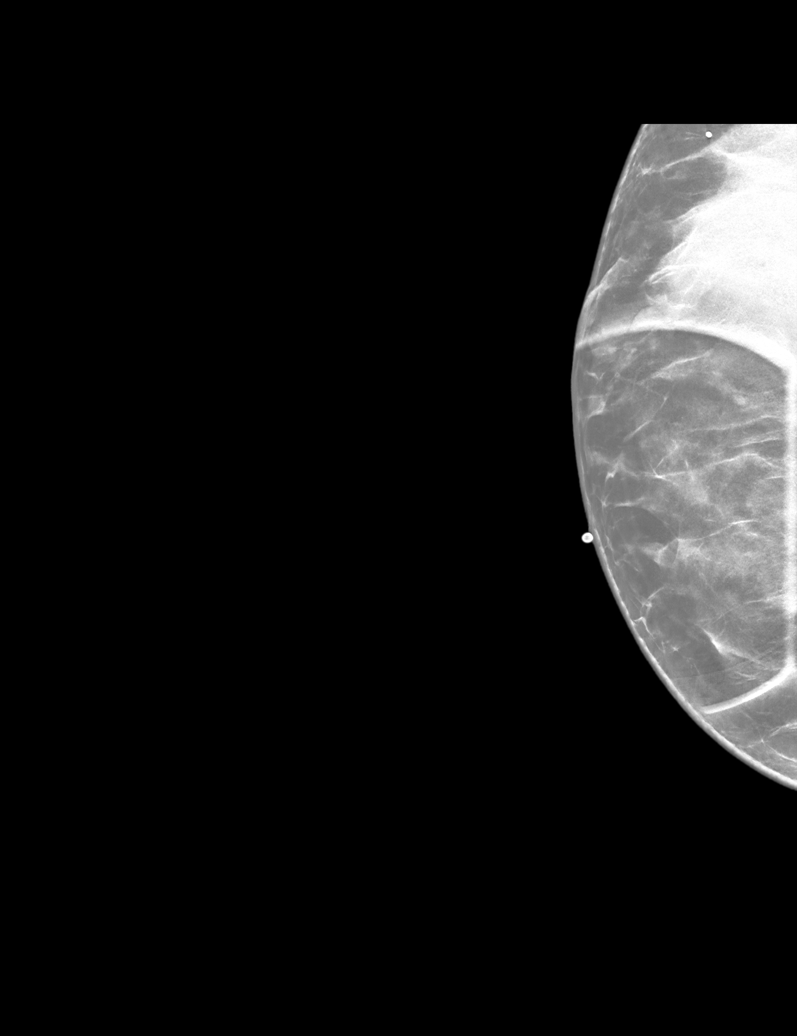

[R MLO synth-2D (2 of 2)]
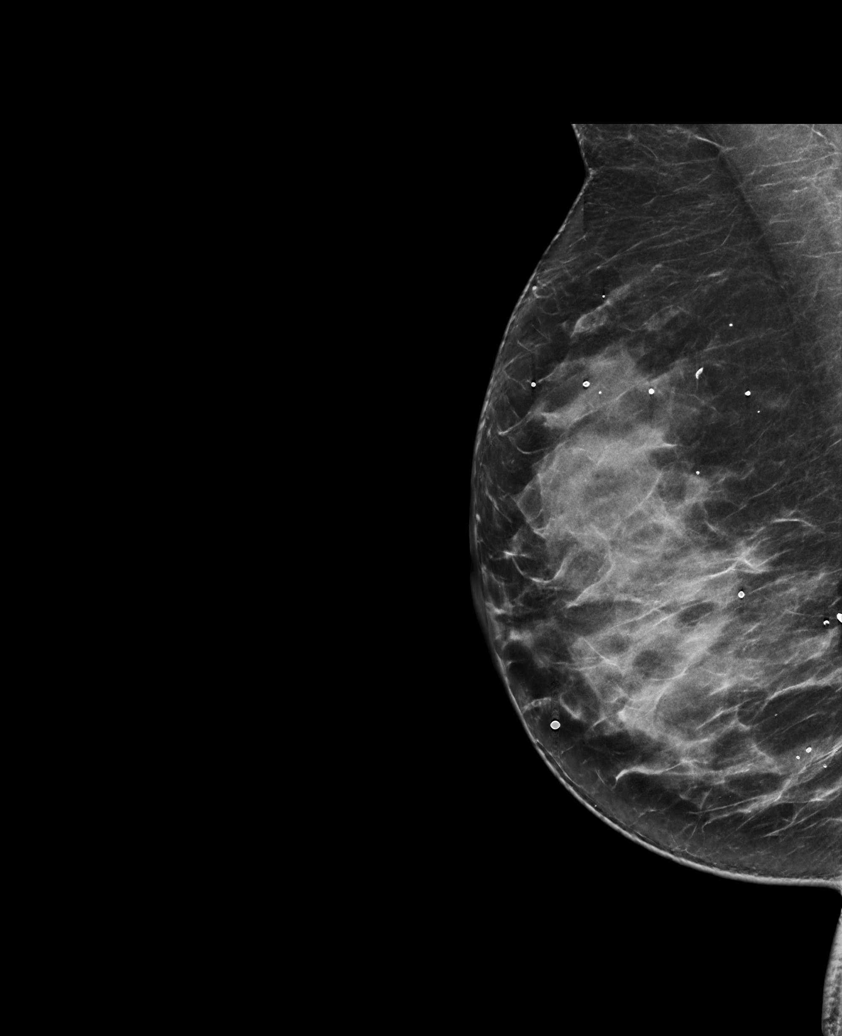

[L CC synth-2D]
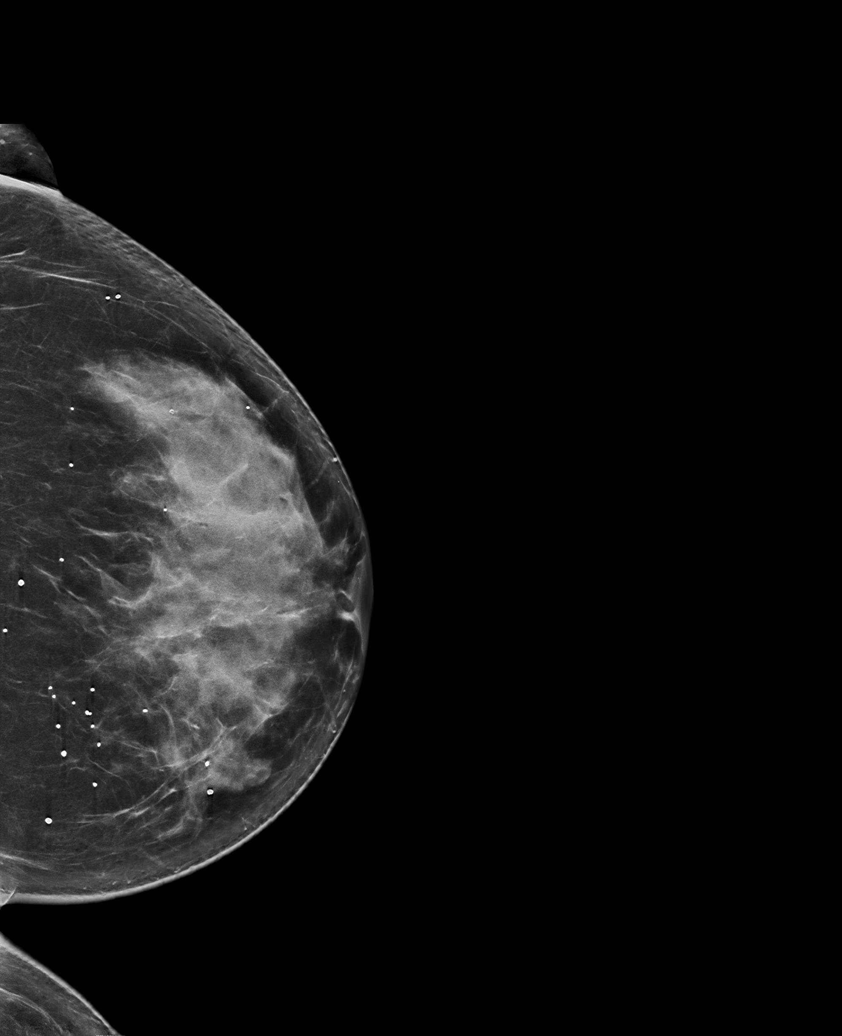

[L MLO synth-2D]
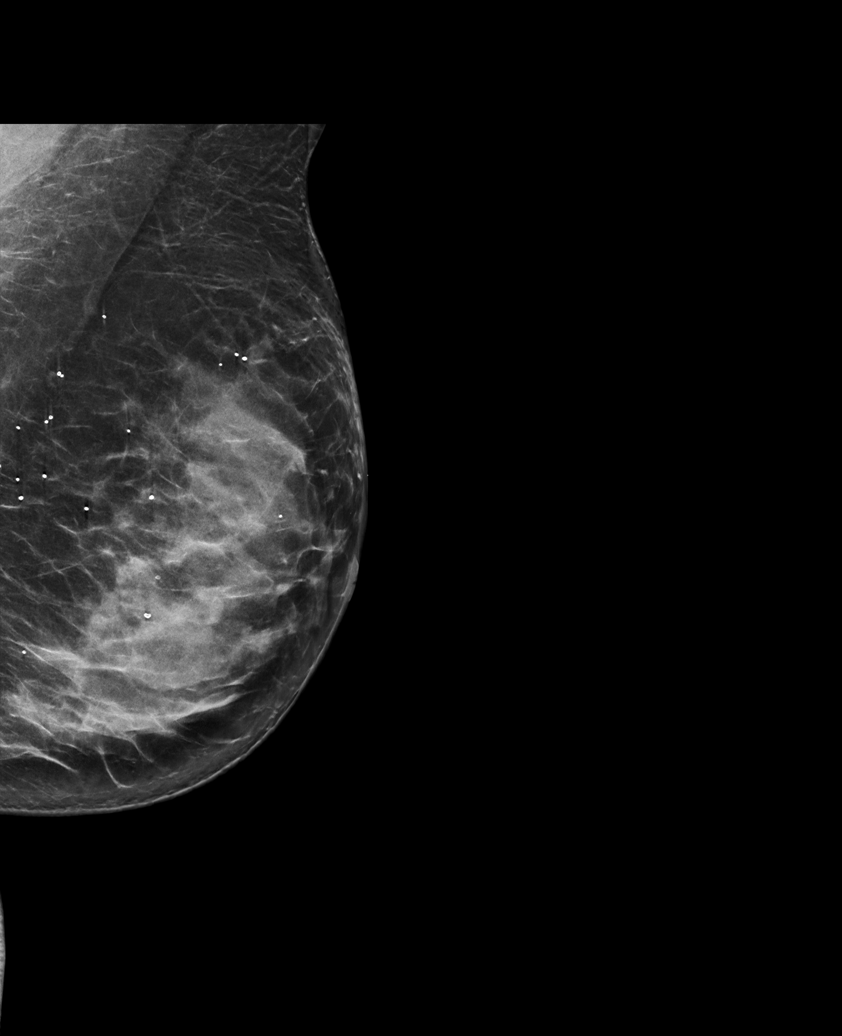

[R MLO tomo · tomo slice 40/79.0]
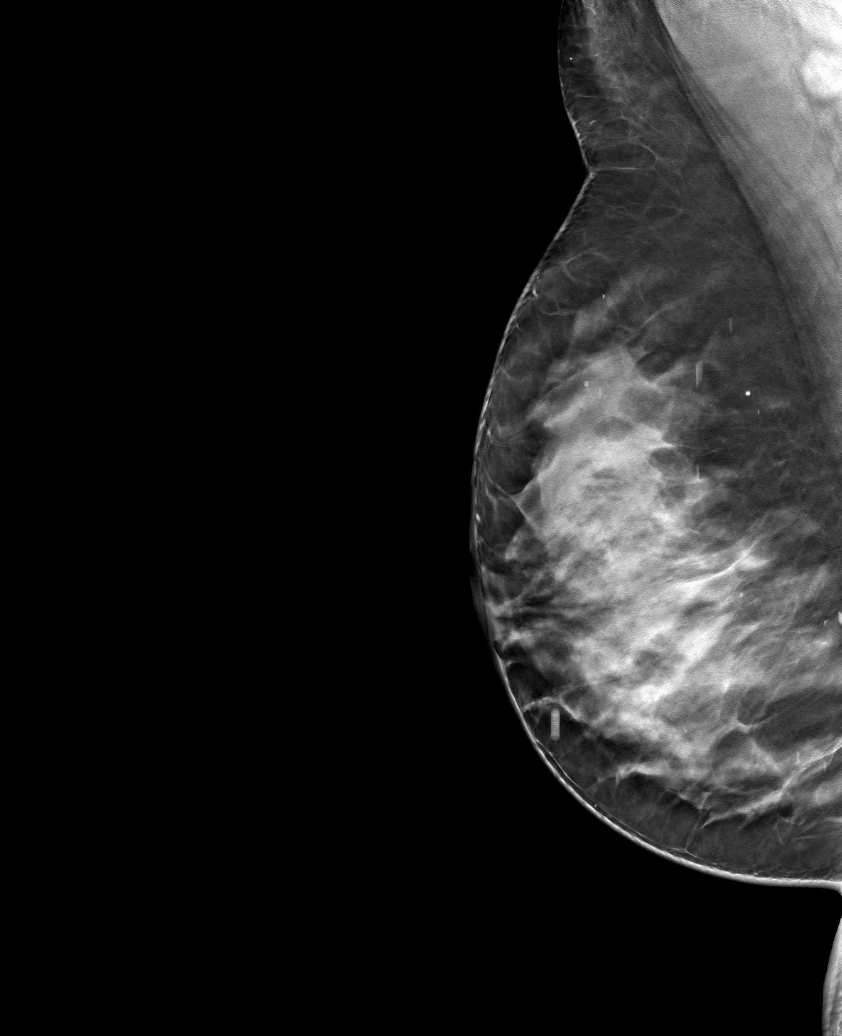

[6 of 30 positions shown; findings below may reference images not displayed]

ACR Breast Density Category d: The breast tissue is extremely dense,
which lowers the sensitivity of mammography.
FINDINGS: A radiopaque BB was placed at the site of the patient's palpable
lump in the inferior right breast. No focal or suspicious
mammographic findings are seen deep to the radiopaque BB or within
the remainder of either breast. The parenchymal pattern is stable.

Targeted ultrasound is performed, showing an oval, circumscribed
hypoechoic mass at the deep 7 o'clock position 3 cm from the nipple.
It measures 7 x 7 x 4 mm. There is no internal vascularity.
IMPRESSION: 1. Probably benign right breast mass at the 7 o'clock position.
Recommendation is for short-term interval follow-up.
2. Otherwise no mammographic evidence of malignancy in either
breast.

RECOMMENDATION:
Right breast ultrasound in 6 months.

I have discussed the findings and recommendations with the patient.
If applicable, a reminder letter will be sent to the patient
regarding the next appointment.

BI-RADS CATEGORY  3: Probably benign.

## 2020-07-31 IMAGING — US US BREAST*R* LIMITED INC AXILLA
1 series · 9 of 9 positions shown · non-contrast
Comparison: Previous exam(s).

CLINICAL DATA: 39-year-old female with a physician palpated right
breast lump. Very strong family history of breast cancer with mother
diagnosed at age 44 and grandmother at age 49.

EXAM:
DIGITAL DIAGNOSTIC BILATERAL MAMMOGRAM WITH TOMOSYNTHESIS AND CAD;
ULTRASOUND RIGHT BREAST LIMITED
TECHNIQUE: Bilateral digital diagnostic mammography and breast tomosynthesis
was performed. The images were evaluated with computer-aided
detection.; Targeted ultrasound examination of the right breast was
performed

[Series 1: us breast*right* limited inc axilla · 0.07mm/px · 9 of 9 slices shown]
[im 1/9]
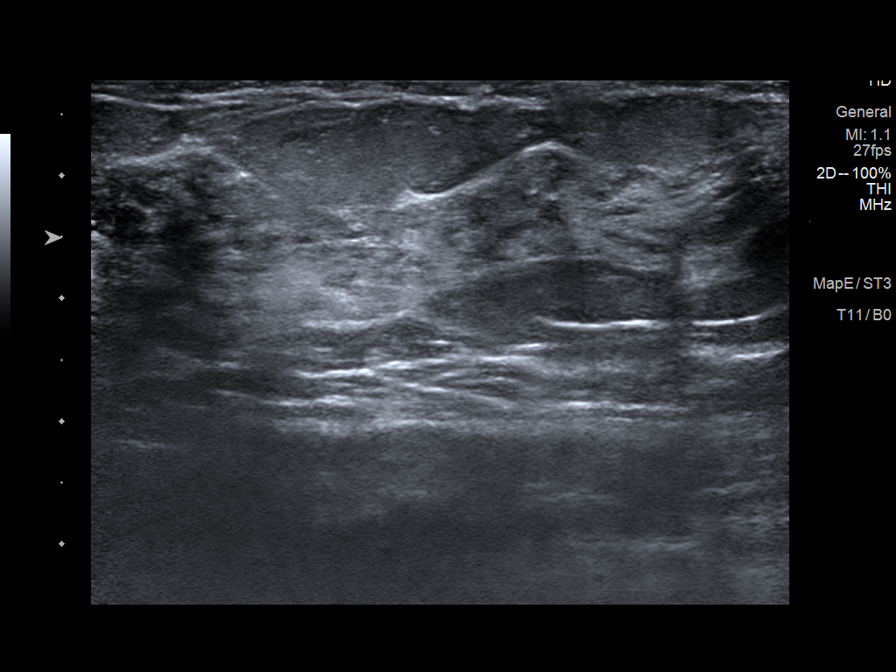
[im 2/9]
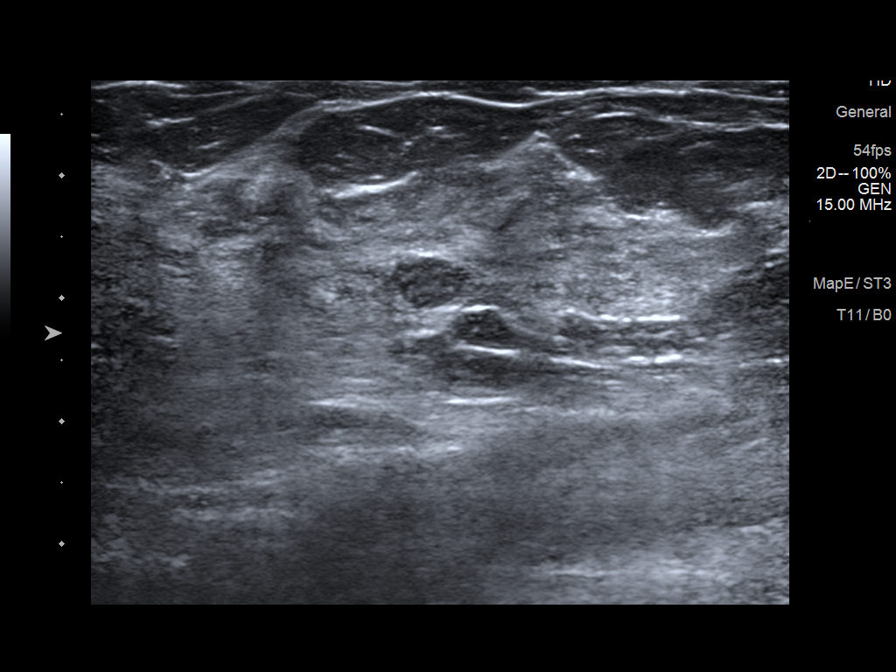
[im 3/9]
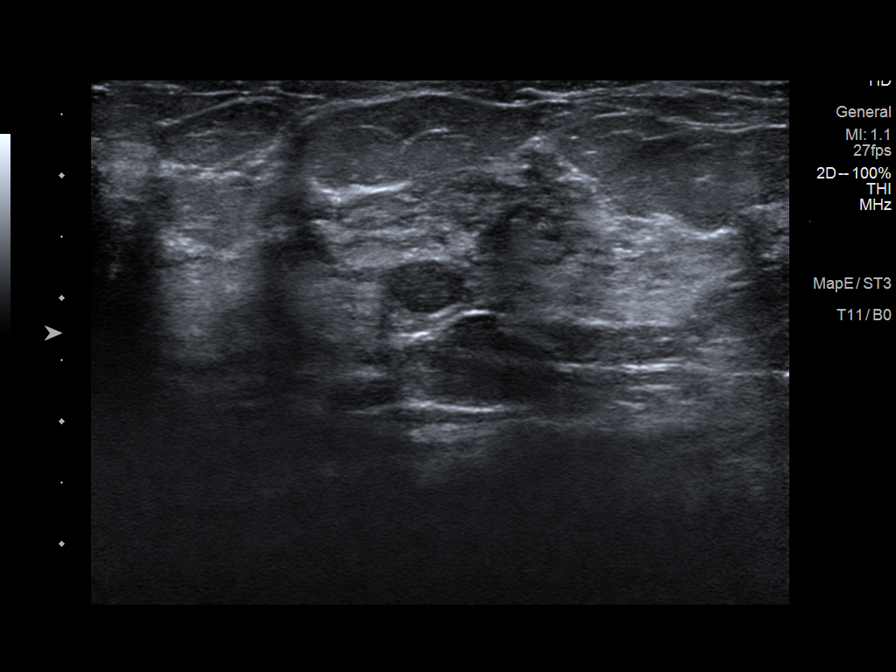
[im 4/9]
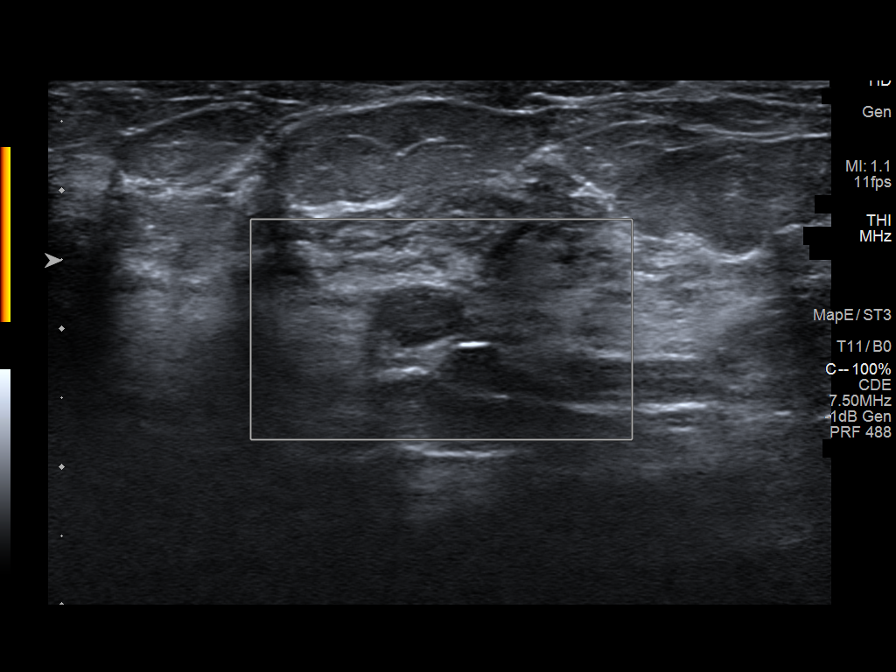
[im 5/9]
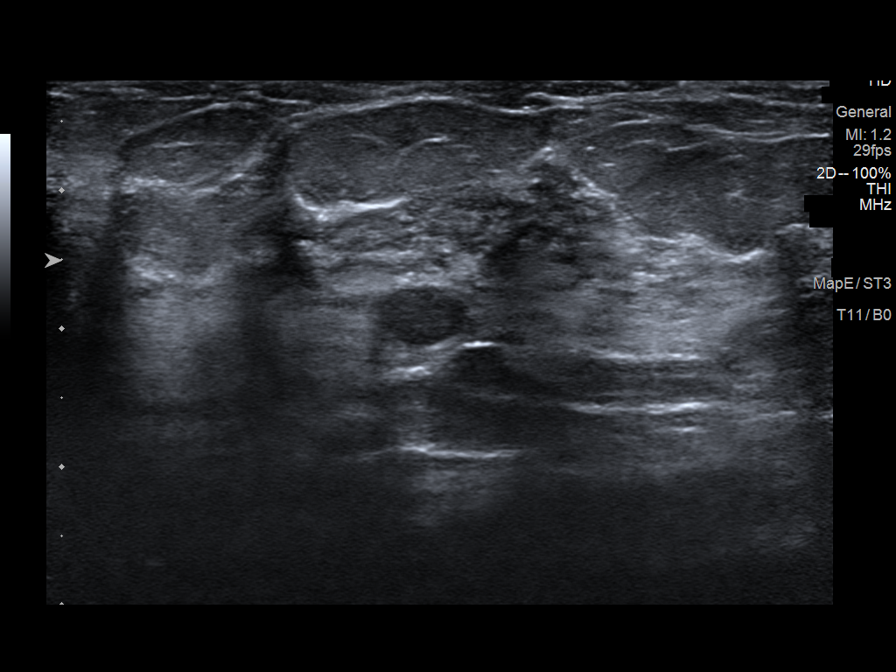
[im 6/9]
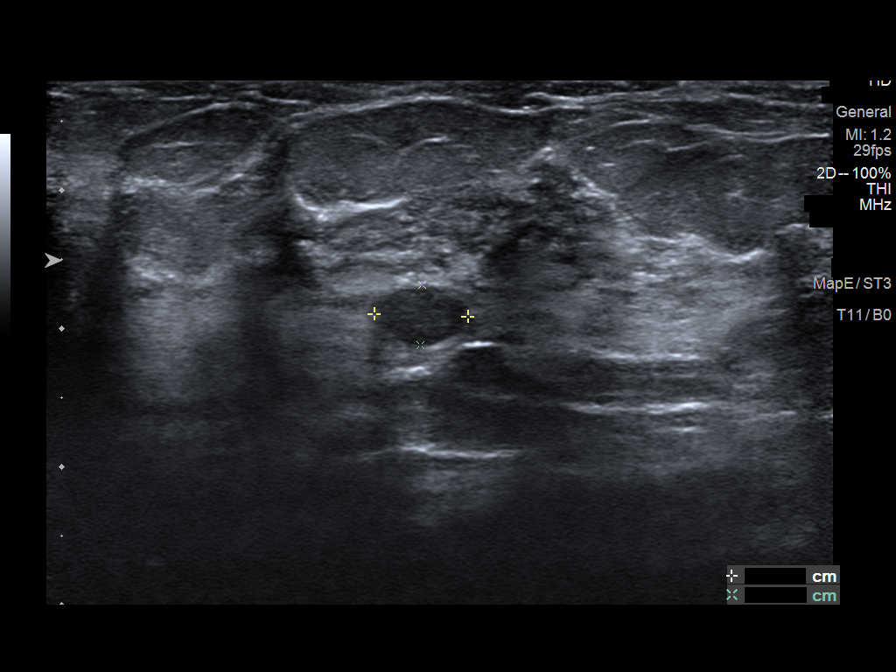
[im 7/9]
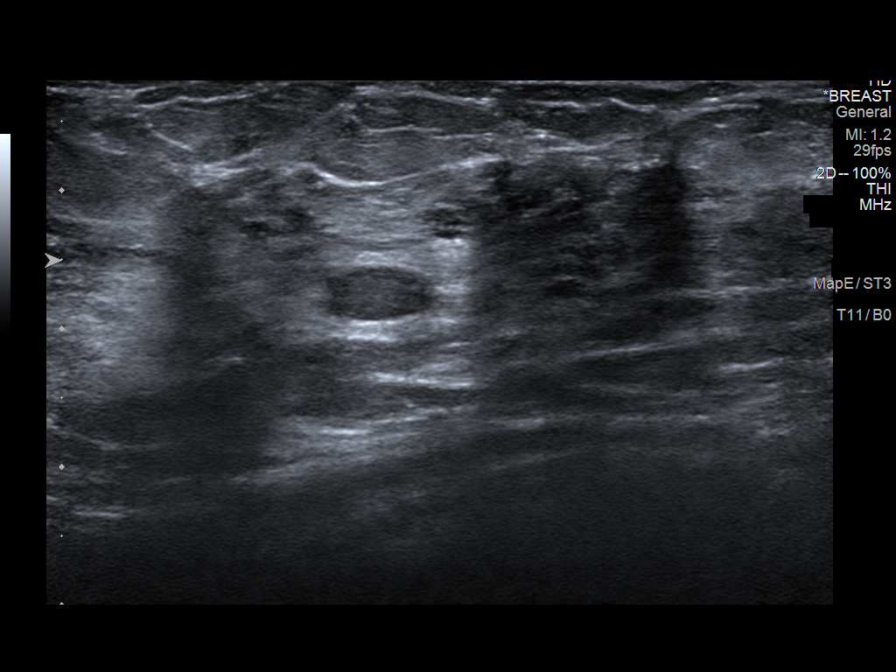
[im 8/9]
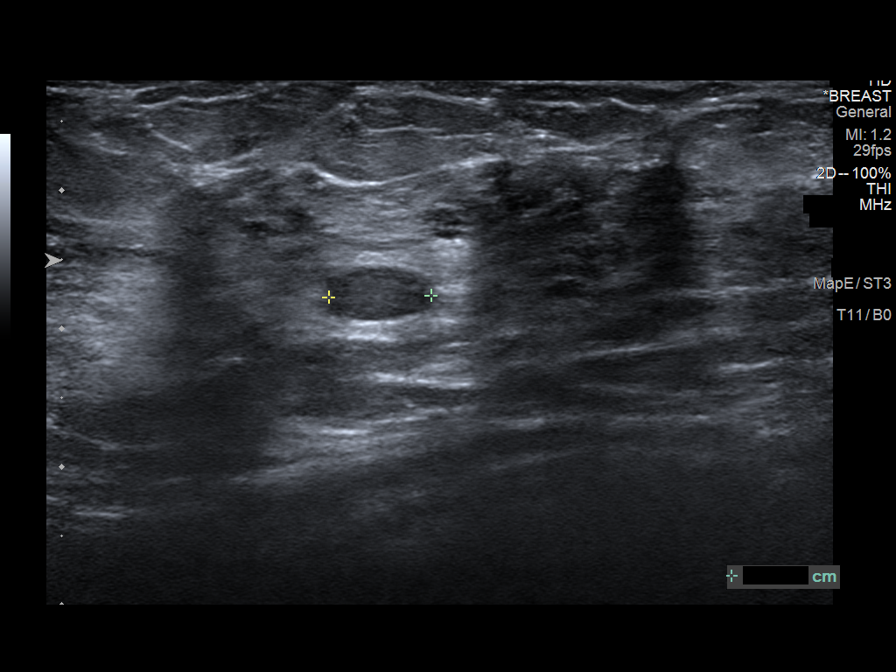
[im 9/9]
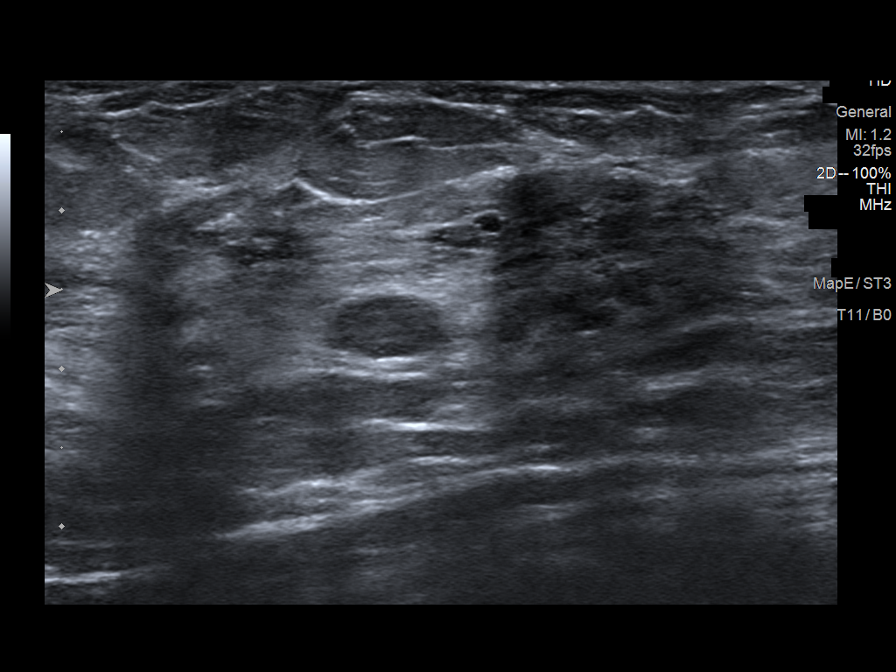

[9 of 9 positions shown; findings below may reference images not displayed]

ACR Breast Density Category d: The breast tissue is extremely dense,
which lowers the sensitivity of mammography.
FINDINGS: A radiopaque BB was placed at the site of the patient's palpable
lump in the inferior right breast. No focal or suspicious
mammographic findings are seen deep to the radiopaque BB or within
the remainder of either breast. The parenchymal pattern is stable.

Targeted ultrasound is performed, showing an oval, circumscribed
hypoechoic mass at the deep 7 o'clock position 3 cm from the nipple.
It measures 7 x 7 x 4 mm. There is no internal vascularity.
IMPRESSION: 1. Probably benign right breast mass at the 7 o'clock position.
Recommendation is for short-term interval follow-up.
2. Otherwise no mammographic evidence of malignancy in either
breast.

RECOMMENDATION:
Right breast ultrasound in 6 months.

I have discussed the findings and recommendations with the patient.
If applicable, a reminder letter will be sent to the patient
regarding the next appointment.

BI-RADS CATEGORY  3: Probably benign.

## 2020-08-01 ENCOUNTER — Ambulatory Visit: Payer: BC Managed Care – PPO

## 2020-08-10 ENCOUNTER — Other Ambulatory Visit: Payer: BC Managed Care – PPO

## 2020-08-21 ENCOUNTER — Other Ambulatory Visit: Payer: BC Managed Care – PPO

## 2020-11-07 ENCOUNTER — Telehealth: Payer: Self-pay | Admitting: Neurology

## 2020-11-07 ENCOUNTER — Other Ambulatory Visit: Payer: Self-pay

## 2020-11-07 ENCOUNTER — Ambulatory Visit (INDEPENDENT_AMBULATORY_CARE_PROVIDER_SITE_OTHER): Payer: Medicaid Other | Admitting: Neurology

## 2020-11-07 ENCOUNTER — Encounter: Payer: Self-pay | Admitting: Neurology

## 2020-11-07 ENCOUNTER — Ambulatory Visit: Payer: Self-pay | Admitting: Neurology

## 2020-11-07 VITALS — BP 117/81 | HR 74 | Ht 62.0 in | Wt 165.0 lb

## 2020-11-07 DIAGNOSIS — R200 Anesthesia of skin: Secondary | ICD-10-CM | POA: Diagnosis not present

## 2020-11-07 DIAGNOSIS — R29898 Other symptoms and signs involving the musculoskeletal system: Secondary | ICD-10-CM | POA: Diagnosis not present

## 2020-11-07 DIAGNOSIS — M5412 Radiculopathy, cervical region: Secondary | ICD-10-CM

## 2020-11-07 MED ORDER — DICLOFENAC SODIUM 75 MG PO TBEC: 75.0000 mg | DELAYED_RELEASE_TABLET | Freq: Two times a day (BID) | ORAL | 5 refills | Status: DC

## 2020-11-07 NOTE — Telephone Encounter (Signed)
MRI cervical spine wo contrast self pay- sent to GI for scheduling

## 2020-11-07 NOTE — Progress Notes (Signed)
[facility]  PATIENT: [name] DOB: 1980-08-31  REFERRING DOCTOR OR PCP:  Princess Bruins, MD SOURCE: Patient, Notes from primary care  _________________________________   HISTORICAL  CHIEF COMPLAINT:  Chief Complaint  Patient presents with   New Patient (Initial Visit)    Rm 2, w ex husband [place]. Internal referral for R arm numbness. About a year ago pt started off having tingling in her R arm. This May while working one day she felt pn and had  swelling from elbow down to her hand. She is having a hard time grabbing object up high. Has numbness in R arm and loss of strength in hand at times. Pt will sleep fine but wake up with crucial pn and is unable to lift arm. Pt has been using an arm sleeve and wrist support at bedtime. With some relief. Takes otc ibup.     HISTORY OF PRESENT ILLNESS:  I had the pleasure of seeing your patient, [name], at [facility] for neurologic consultation regarding her right arm numbness and weakness  She is a 40 year old woman who has had dysesthesias in the right arm x 1 year and more pain in the right forearm from the elbow to the wrist x 5 months.  The numbness and dysesthesias intensified.  She notes some weakness as well.    Pain is milder during the day.  However, when she wakes up at night, there is more pain in the arm and notes more weakness.  She denies neck pain.    She cannot recall any preceding event that may have caused the symptoms.  Ibuprofen helps the pain a little bit.  Bladder function is fine.  Legs are usually fine though she notes mild numbness/dysesthesias in the right 4th/5th toes.      REVIEW OF SYSTEMS: Constitutional: No fevers, chills, sweats, or change in appetite Eyes: No visual changes, double vision, eye pain Ear, nose and throat: No hearing loss, ear pain, nasal congestion, sore throat Cardiovascular: No chest pain, palpitations Respiratory:  No shortness of breath at  rest or with exertion.   No wheezes GastrointestinaI: No nausea, vomiting, diarrhea, abdominal pain, fecal incontinence Genitourinary:  No dysuria, urinary retention or frequency.  No nocturia. Musculoskeletal:  No neck pain, back pain Integumentary: No rash, pruritus, skin lesions Neurological: as above Psychiatric: No depression at this time.  No anxiety Endocrine: No palpitations, diaphoresis, change in appetite, change in weigh or increased thirst Hematologic/Lymphatic:  No anemia, purpura, petechiae. Allergic/Immunologic: No itchy/runny eyes, nasal congestion, recent allergic reactions, rashes  ALLERGIES: No Known Allergies  HOME MEDICATIONS:  Current Outpatient Medications:    phentermine 37.5 MG capsule, Take 1 capsule (37.5 mg total) by mouth every morning., Disp: 30 capsule, Rfl: 2  PAST MEDICAL HISTORY: History reviewed. No pertinent past medical history.  PAST SURGICAL HISTORY: Past Surgical History:  Procedure Laterality Date   CESAREAN SECTION      FAMILY HISTORY: Family History  Problem Relation Age of Onset   Breast cancer Mother 51       deceased 12   Hypertension Father    Breast cancer Maternal Grandmother 63       deceased 33s    SOCIAL HISTORY:  Social History   Socioeconomic History   Marital status: Married    Spouse name: Not on file   Number of children: Not on file   Years of education: Not on file   Highest education level: Not on file  Occupational History   Not on file  Tobacco Use   Smoking status: Light Smoker   Smokeless tobacco: Never  Vaping Use   Vaping Use: Never used  Substance and Sexual Activity   Alcohol use: Yes    Comment: socially   Drug use: No   Sexual activity: Yes    Partners: Male    Comment: 1si intercourse-21, partners- 5+, married- 76 yrs  Other Topics Concern   Not on file  Social History Narrative   Not on file   Social Determinants of Health   Financial Resource Strain: Not on file  Food  Insecurity: Not on file  Transportation Needs: Not on file  Physical Activity: Not on file  Stress: Not on file  Social Connections: Not on file  Intimate Partner Violence: Not on file     PHYSICAL EXAM  Vitals:   11/07/20 1031  BP: 117/81  Pulse: 74  Weight: 165 lb (74.8 kg)  Height: 5' 2"  (1.575 m)    Body mass index is 30.18 kg/m.   General: The patient is well-developed and well-nourished and in no acute distress  HEENT:  Head is [place]/AT.  Sclera are anicteric.    Neck: No carotid bruits are noted.  The neck is nontender.  Good range of motion.  Cardiovascular: The heart has a regular rate and rhythm with a normal S1 and S2. There were no murmurs, gallops or rubs.    Skin: Extremities are without rash or  edema.  Neurologic Exam  Mental status: The patient is alert and oriented x 3 at the time of the examination. The patient has apparent normal recent and remote memory, with an apparently normal attention span and concentration ability.   Speech is normal.  Cranial nerves: Extraocular movements are full.  Patient strength and sensation was normal.  No obvious hearing deficits are noted.  Motor:  Muscle bulk is normal.   Tone is normal. Strength is  5 / 5 in all 4 extremities.   Sensory: Sensory testing is intact to pinprick, soft touch and vibration sensation in all 4 extremities.  Coordination: Cerebellar testing reveals good finger-nose-finger and heel-to-shin bilaterally.  Gait and station: Station is normal.   Gait is normal. Tandem gait is normal. Romberg is negative.   Reflexes: Deep tendon reflexes are symmetric and 2 in the arms but increased with spread at the knees.  No ankle clonus.   Plantar responses are flexor.    DIAGNOSTIC DATA (LABS, IMAGING, TESTING) - I reviewed patient records, labs, notes, testing and imaging myself where available.  Lab Results  Component Value Date   WBC 4.9 04/25/2019   HGB 12.8 04/25/2019   HCT 38.3 04/25/2019    MCV 91.0 04/25/2019   PLT 247 04/25/2019      Component Value Date/Time   NA 139 04/25/2019 0901   K 3.5 04/25/2019 0901   CL 107 04/25/2019 0901   CO2 27 04/25/2019 0901   GLUCOSE 99 04/25/2019 0901   BUN 12 04/25/2019 0901   CREATININE 0.58 04/25/2019 0901   CALCIUM 8.6 04/25/2019 0901   PROT 6.5 04/25/2019 0901   ALBUMIN 4.7 06/22/2013 0949   AST 11 04/25/2019 0901   ALT 12 04/25/2019 0901   ALKPHOS 51 06/22/2013 0949   BILITOT 0.4 04/25/2019 0901   GFRNONAA >60 05/31/2008 0259   GFRAA  05/31/2008 0259    >60        The eGFR has been calculated using the MDRD equation. This calculation has  not been validated in all clinical situations. eGFR's persistently <60 mL/min signify possible Chronic Kidney Disease.   Lab Results  Component Value Date   CHOL 150 04/25/2019   HDL 42 (L) 04/25/2019   LDLCALC 90 04/25/2019   TRIG 89 04/25/2019   CHOLHDL 3.6 04/25/2019   No results found for: HGBA1C No results found for: VITAMINB12 Lab Results  Component Value Date   TSH 4.52 (H) 04/25/2019       ASSESSMENT AND PLAN Cervical radiculopathy - Plan: MR CERVICAL SPINE WO CONTRAST  Arm weakness - Plan: MR CERVICAL SPINE WO CONTRAST  Arm numbness - Plan: MR CERVICAL SPINE WO CONTRAST   In summary, Ms. Filosa is a 40 year old woman with dysesthesias and intermittent weakness in the right arm.  Her exam actually showed normal strength and sensation at this time.  However, reflexes were increased in the legs.  Therefore, I think we should check the MRI of the cervical spine to make sure that there is not an intrinsic or extrinsic myelopathy.  If symptoms persist and the MRI is normal/near normal consider EMG/NCV testing.  To help with pain we will also start Diclofenac 75 mg po bid  He will return to see [place] based on the results of the tests or if symptoms do not improve or new symptoms develop.  Thank you for asking to see Ms. Couse.  Please let me know if I can be of further  assistance with her or other patients in the future.   Raymona Boss A. Felecia Shelling, MD, [facility] 07/07/5971, 31:25 AM Certified in Neurology, Clinical Neurophysiology, Sleep Medicine and Neuroimaging  [facility] [address], [place], [place] [address] [phone]

## 2020-11-19 ENCOUNTER — Ambulatory Visit
Admission: RE | Admit: 2020-11-19 | Discharge: 2020-11-19 | Disposition: A | Discharge status: Home / Self Care | Payer: Self-pay | Source: Ambulatory Visit | Attending: Neurology | Admitting: Neurology

## 2020-11-19 DIAGNOSIS — R29898 Other symptoms and signs involving the musculoskeletal system: Secondary | ICD-10-CM

## 2020-11-19 DIAGNOSIS — M5412 Radiculopathy, cervical region: Secondary | ICD-10-CM | POA: Diagnosis not present

## 2020-11-19 DIAGNOSIS — R200 Anesthesia of skin: Secondary | ICD-10-CM

## 2020-11-19 IMAGING — MR MR CERVICAL SPINE W/O CM
5 series · 37 of 48 positions shown · non-contrast
Comparison: None.

CLINICAL DATA: Cervical radiculopathy, no red flags right arm pain
and weakness

EXAM:
MRI CERVICAL SPINE WITHOUT CONTRAST
TECHNIQUE: Multiplanar, multisequence MR imaging of the cervical spine was
performed. No intravenous contrast was administered.

[Series 2: T2 · sagittal · 3.0mm · 0.41mm/px · 8 of 17 slices shown (1 of 2)]
[im 1/17]
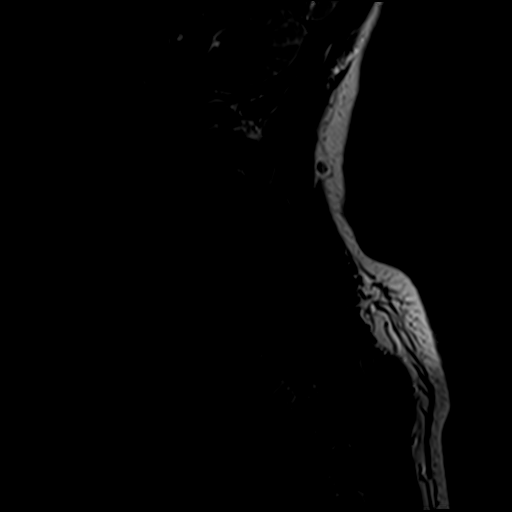
[im 3/17]
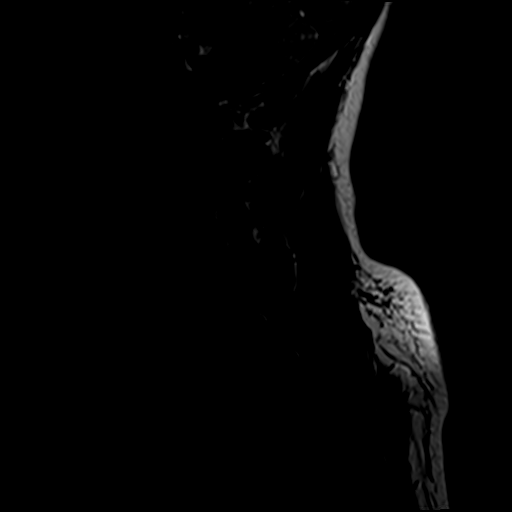
[im 5/17]
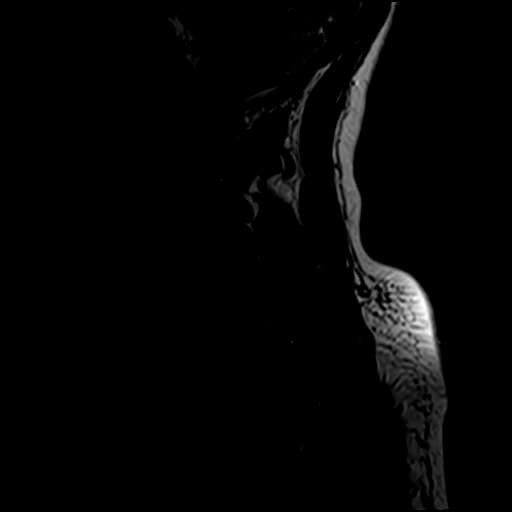
[im 7/17]
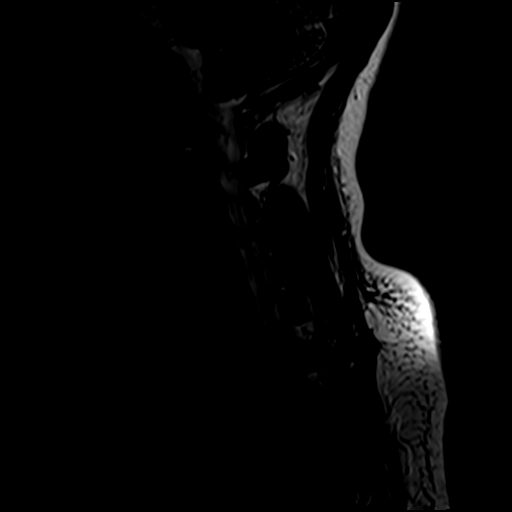
[im 10/17]
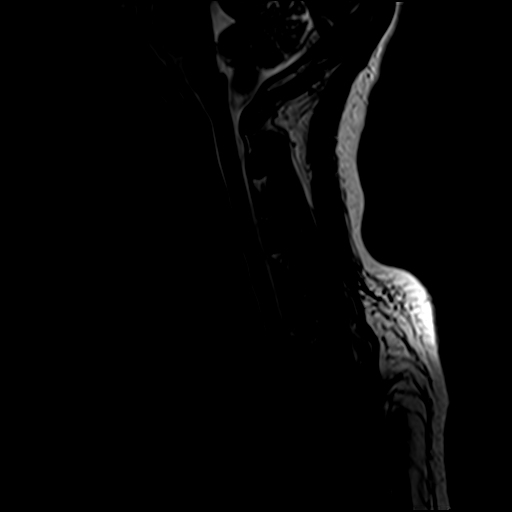
[im 12/17]
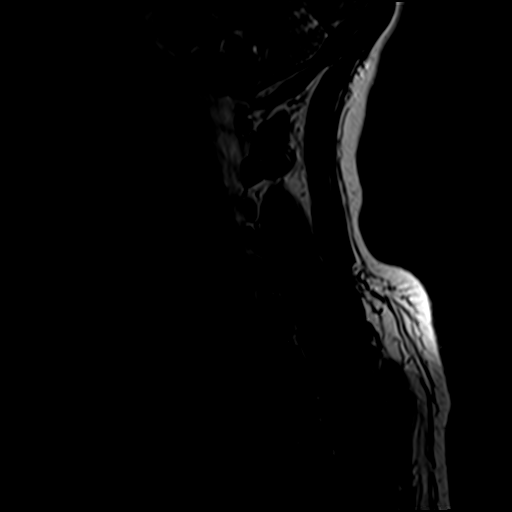
[im 14/17]
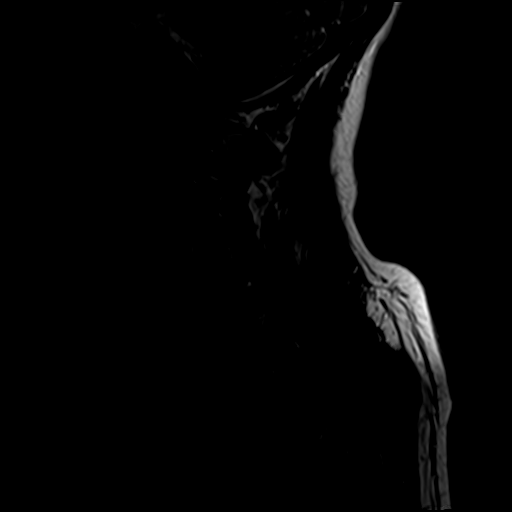
[im 17/17]
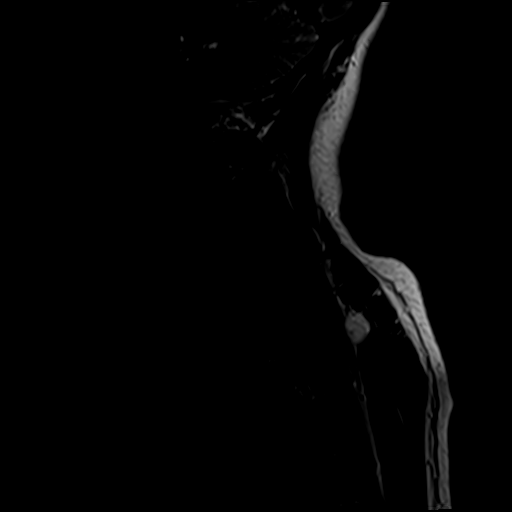

[Series 3: STIR · sagittal · 3.0mm · 0.82mm/px · 8 of 17 slices shown]
[im 1/17]
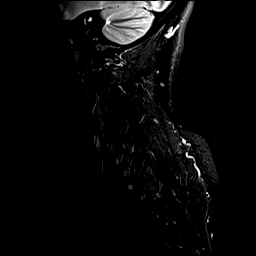
[im 3/17]
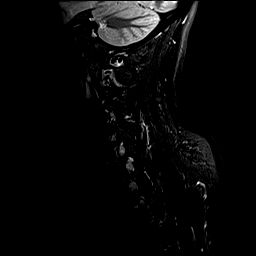
[im 5/17]
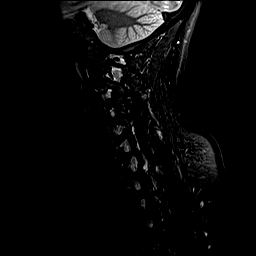
[im 7/17]
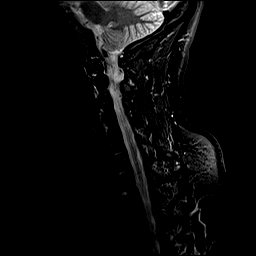
[im 10/17]
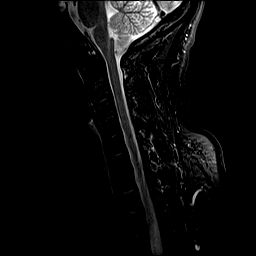
[im 12/17]
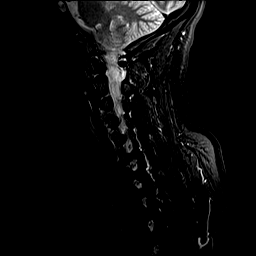
[im 14/17]
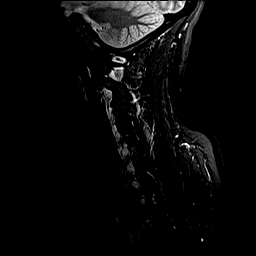
[im 17/17]
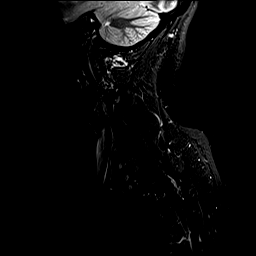

[Series 4: T1 · sagittal · 3.0mm · 0.82mm/px · 8 of 17 slices shown]
[im 1/17]
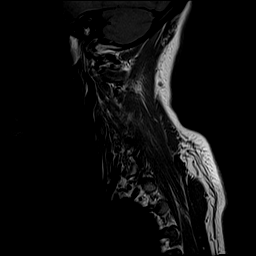
[im 3/17]
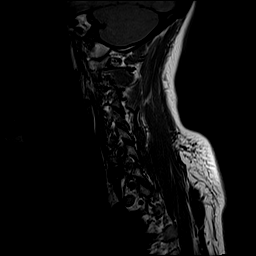
[im 5/17]
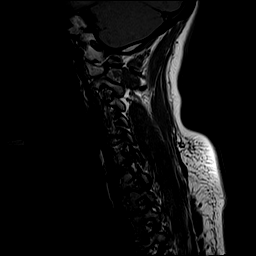
[im 7/17]
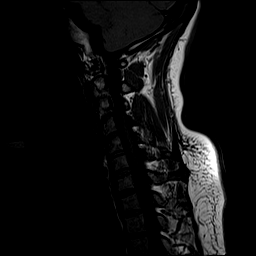
[im 10/17]
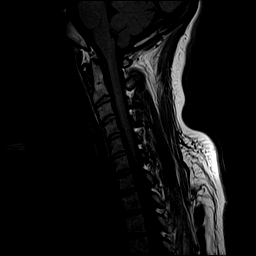
[im 12/17]
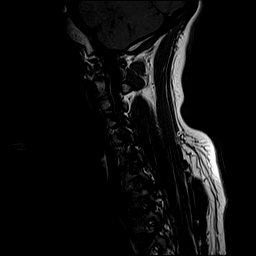
[im 14/17]
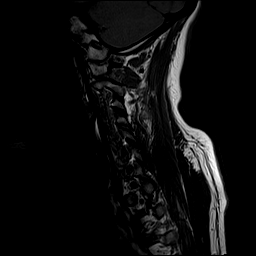
[im 17/17]
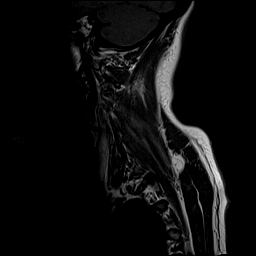

[Series 5: T2 · axial · 3.0mm · 0.70mm/px · z∈[-30,+64]mm · 9 of 26 slices shown (2 of 2)]
[im 1/26]
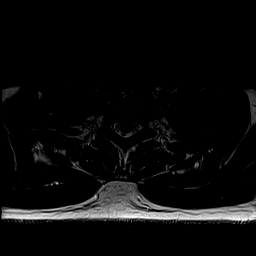
[im 5/26]
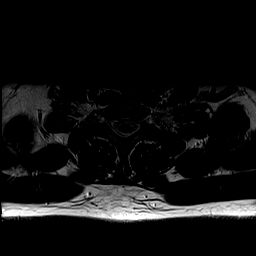
[im 7/26]
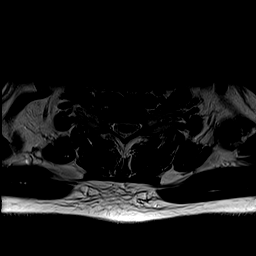
[im 12/26]
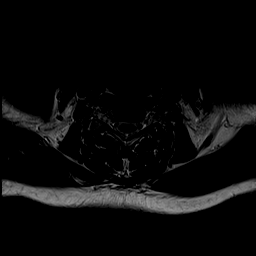
[im 14/26]
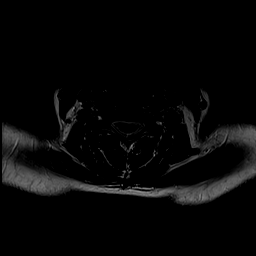
[im 19/26]
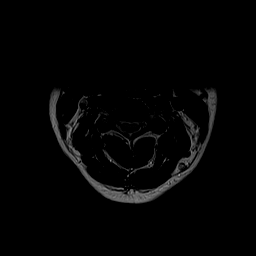
[im 21/26]
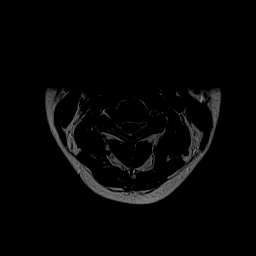
[im 23/26]
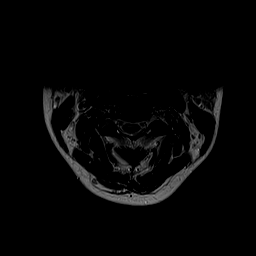
[im 26/26]
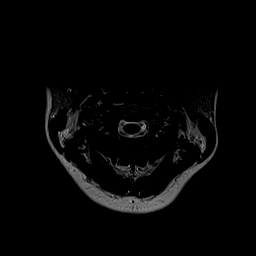

[Series 6: GRE · axial · 3.0mm · 0.35mm/px · z∈[-30,+11]mm · 4 of 26 slices shown]
[im 1/26]
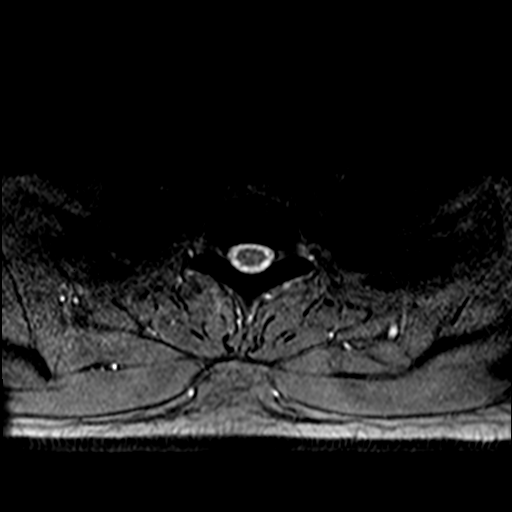
[im 5/26]
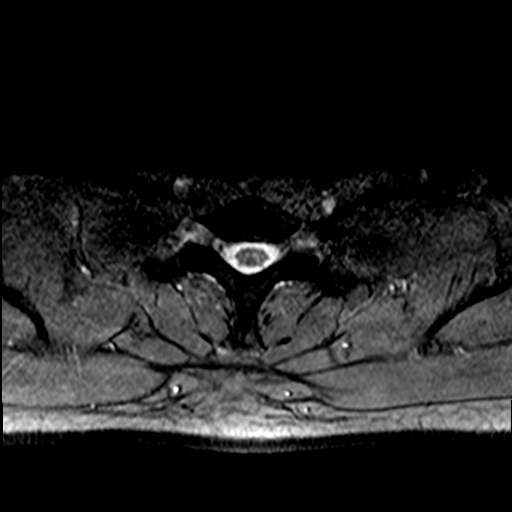
[im 7/26]
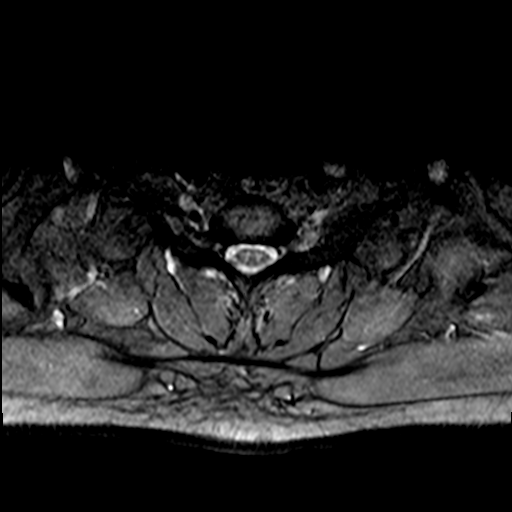
[im 12/26]
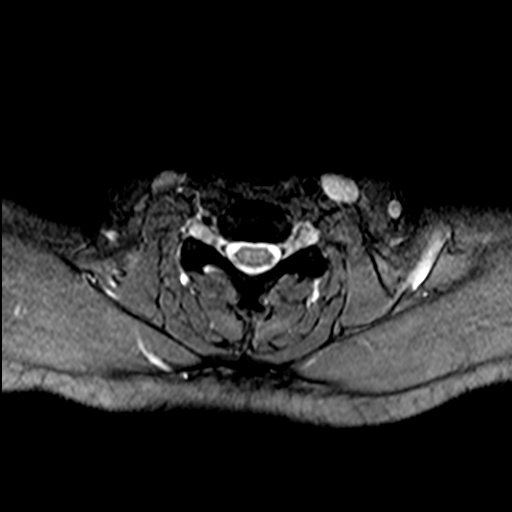

[37 of 48 positions shown; findings below may reference images not displayed]

FINDINGS: Alignment: Physiologic

Vertebrae: No fracture, evidence of discitis, or bone lesion.

Cord: Normal signal and morphology.

Posterior Fossa, vertebral arteries, paraspinal tissues: Negative.

Disc levels: The craniocervical junction is unremarkable.

C2-C3: No significant spinal canal or neural foraminal narrowing.

C3-C4: No significant spinal canal or neural foraminal narrowing.

C4-C5: No significant spinal canal or neural foraminal narrowing.

C5-C6: No significant spinal canal or neural foraminal narrowing.

C6-C7: No significant spinal canal or neural foraminal narrowing.

C7-T1: No significant spinal canal or neural foraminal narrowing.
IMPRESSION: Unremarkable MRI of the cervical spine. No evidence of stenosis or
impingement.

## 2020-11-23 ENCOUNTER — Telehealth: Payer: Self-pay | Admitting: *Deleted

## 2020-11-23 DIAGNOSIS — R200 Anesthesia of skin: Secondary | ICD-10-CM

## 2020-11-23 NOTE — Telephone Encounter (Signed)
-----   Message from Jerilynn Mages sent at 11/23/2020  9:21 AM EDT ----- Regarding: referral Dr Seymour Bars referral patient to [facility] for arm pain. Patient had MRI and was given pain medication but she is not happy with the diagnosis which is they did not find anything wrong with arm. Patient want Dr Seymour Bars to referral her to someone else. Thx

## 2020-11-23 NOTE — Telephone Encounter (Signed)
Dr.Lavoie please see the below [name] spoke with patient. The neurology doctor Dr.Sater did mention in his note patient should follow up if another further complaints, but sounds like patient does not want to do this. Please advise

## 2020-11-26 NOTE — Telephone Encounter (Signed)
Dr.Lavoie replied  " Refer to Lyda Perone I placed order on at [facility] at [facility] [phone] they will call patient to schedule.

## 2020-11-27 NOTE — Telephone Encounter (Signed)
Patient scheduled on 12/03/20 with Nadara Mustard, MD

## 2020-12-03 ENCOUNTER — Encounter: Payer: Self-pay | Admitting: Orthopedic Surgery

## 2020-12-03 ENCOUNTER — Ambulatory Visit (INDEPENDENT_AMBULATORY_CARE_PROVIDER_SITE_OTHER): Payer: Medicaid Other | Admitting: Orthopedic Surgery

## 2020-12-03 VITALS — Ht 62.0 in | Wt 165.0 lb

## 2020-12-03 DIAGNOSIS — M7711 Lateral epicondylitis, right elbow: Secondary | ICD-10-CM | POA: Diagnosis not present

## 2020-12-03 MED ORDER — LIDOCAINE HCL 1 % IJ SOLN
2.0000 mL | INTRAMUSCULAR | Status: AC | PRN
  Administered 2020-12-03: 2 mL

## 2020-12-03 MED ORDER — METHYLPREDNISOLONE ACETATE 40 MG/ML IJ SUSP
40.0000 mg | INTRAMUSCULAR | Status: AC | PRN
  Administered 2020-12-03: 40 mg via INTRA_ARTICULAR

## 2020-12-03 NOTE — Progress Notes (Signed)
Office Visit Note   Patient: [name]           Date of Birth: 05/25/1980           MRN: [id number] Visit Date: 12/03/2020              Requested by: Genia Del, MD [address] [place],  [place] [address] PCP: Genia Del, MD  Chief Complaint  Patient presents with   Right Forearm - Pain, Numbness      HPI: Patient is a 40 year old woman who is seen for initial evaluation for her right upper extremity.  Patient states she has had numbness radiating from the lateral condyle to her fingers for about a year.  Patient has pain with overhead activities pain with working.  Patient states she has had a neurologic work-up and has had an MRI scan this month that was normal.  Patient complains of numbness and decreased strength in her right hand pain at night.  Patient has tried an arm sleeve.  Patient works in [employer].  Assessment & Plan: Visit Diagnoses:  1. Lateral epicondylitis, right elbow     Plan: Recommend that she get a tennis elbow splint we reviewed this on her phone.  She underwent a injection of the lateral condyle will reevaluate in 4 weeks for the possibility of repeat injection.  Follow-Up Instructions: Return in about 4 weeks (around 12/31/2020).   Ortho Exam  Patient is alert, oriented, no adenopathy, well-dressed, normal affect, normal respiratory effort. Examination patient's upper extremities are neurovascular intact no focal motor weakness.  She has full range of motion of her shoulder.  Review of the MRI scan shows no herniated disks.  Patient is point tender to palpation over the lateral upper condyle and resisted extension of the wrist reproduces her pain.  Imaging: No results found. No images are attached to the encounter.  Labs: Lab Results  Component Value Date   LABURIC 3.5 05/31/2008   REPTSTATUS 06/14/2008 FINAL 06/08/2008   GRAMSTAIN  06/08/2008    MODERATE WBC PRESENT,BOTH PMN AND MONONUCLEAR MODERATE SQUAMOUS  EPITHELIAL CELLS PRESENT MODERATE GRAM POSITIVE COCCI IN PAIRS IN CLUSTERS IN CHAINS FEW GRAM POSITIVE RODS RARE GRAM NEGATIVE RODS   CULT NO GROWTH 5 DAYS 06/08/2008   LABORGA Multiple bacterial morphotypes present, none 06/20/2013   LABORGA predominant. Suggest appropriate recollection if  06/20/2013   LABORGA clinically indicated. 06/20/2013     Lab Results  Component Value Date   ALBUMIN 4.7 06/22/2013   ALBUMIN 4.6 06/20/2013   ALBUMIN 2.6 (L) 05/31/2008    No results found for: MG Lab Results  Component Value Date   VD25OH 9 (L) 04/25/2019    No results found for: PREALBUMIN CBC EXTENDED Latest Ref Rng & Units 04/25/2019 01/26/2017 06/22/2013  WBC 3.8 - 10.8 Thousand/uL 4.9 5.8 4.9  RBC 3.80 - 5.10 Million/uL 4.21 4.44 4.48  HGB 11.7 - 15.5 g/dL 62.1 30.8 65.7  HCT 84.6 - 45.0 % 38.3 39.4 39.6  PLT 140 - 400 Thousand/uL 247 230 249  NEUTROABS 1.7 - 7.7 K/uL - - 2.9  LYMPHSABS 0.7 - 4.0 K/uL - - 1.5     Body mass index is 30.18 kg/m.  Orders:  No orders of the defined types were placed in this encounter.  No orders of the defined types were placed in this encounter.    Procedures: Medium Joint Inj: R lateral epicondyle on 12/03/2020 10:37 AM Indications: pain and diagnostic evaluation Details: 22 G  1.5 in needle, lateral approach Medications: 2 mL lidocaine 1 %; 40 mg methylPREDNISolone acetate 40 MG/ML Outcome: tolerated well, no immediate complications Procedure, treatment alternatives, risks and benefits explained, specific risks discussed. Consent was given by the patient. Immediately prior to procedure a time out was called to verify the correct patient, procedure, equipment, support staff and site/side marked as required. Patient was prepped and draped in the usual sterile fashion.     Clinical Data: No additional findings.  ROS:  All other systems negative, except as noted in the HPI. Review of Systems  Objective: Vital Signs: Ht 5\' 2"  (1.575  m)   Wt 165 lb (74.8 kg)   BMI 30.18 kg/m   Specialty Comments:  No specialty comments available.  PMFS History: Patient Active Problem List   Diagnosis Date Noted   Dysmenorrhea 03/13/2014   Menorrhagia with regular cycle 03/13/2014   IUD (intrauterine device) in place 12/28/2013   Family history of breast cancer in female 07/14/2013   H. pylori infection 06/23/2013   GERD (gastroesophageal reflux disease) 06/22/2013   Visit for preventive health examination 06/22/2013   ANEMIA, MILD 07/24/2008   History reviewed. No pertinent past medical history.  Family History  Problem Relation Age of Onset   Breast cancer Mother 55       deceased 62   Hypertension Father    Breast cancer Maternal Grandmother 61       deceased 54s    Past Surgical History:  Procedure Laterality Date   CESAREAN SECTION     Social History   Occupational History   Not on file  Tobacco Use   Smoking status: Light Smoker   Smokeless tobacco: Never  Vaping Use   Vaping Use: Never used  Substance and Sexual Activity   Alcohol use: Yes    Comment: socially   Drug use: No   Sexual activity: Yes    Partners: Male    Comment: 1si intercourse-21, partners- 5+, married- 8 yrs

## 2020-12-31 ENCOUNTER — Other Ambulatory Visit: Payer: Self-pay

## 2020-12-31 ENCOUNTER — Encounter: Payer: Self-pay | Admitting: Orthopedic Surgery

## 2020-12-31 ENCOUNTER — Ambulatory Visit (INDEPENDENT_AMBULATORY_CARE_PROVIDER_SITE_OTHER): Payer: Medicaid Other | Admitting: Orthopedic Surgery

## 2020-12-31 VITALS — Ht 62.0 in | Wt 165.0 lb

## 2020-12-31 DIAGNOSIS — M7711 Lateral epicondylitis, right elbow: Secondary | ICD-10-CM

## 2021-01-28 ENCOUNTER — Encounter: Payer: Self-pay | Admitting: Orthopedic Surgery

## 2021-01-28 NOTE — Progress Notes (Signed)
Office Visit Note   Patient: [name]           Date of Birth: Aug 14, 1980           MRN: [id number] Visit Date: 12/31/2020              Requested by: Genia Del, MD [address] [place],  [place] [address] PCP: Genia Del, MD  Chief Complaint  Patient presents with   Right Elbow - Follow-up      HPI: Patient is a 40 year old woman who presents in follow-up for lateral epicondylitis right elbow she is status postinjection states she is doing well currently wearing a tennis elbow strap.  Patient states she also occasionally has right-sided pain that radiates to the lateral aspect of the right foot.  Assessment & Plan: Visit Diagnoses:  1. Lateral epicondylitis, right elbow     Plan: Recommended Aleve 2 p.o. twice daily for her radicular symptoms follow-up if this worsens.  Continue with the tennis elbow strap as needed.  Follow-Up Instructions: Return if symptoms worsen or fail to improve.   Ortho Exam  Patient is alert, oriented, no adenopathy, well-dressed, normal affect, normal respiratory effort. Examination there is no pain with range of motion of the right elbow lateral condyle is nontender to palpation.  Patient has a negative straight leg raise on the right and no focal motor weakness.  Imaging: No results found. No images are attached to the encounter.  Labs: Lab Results  Component Value Date   LABURIC 3.5 05/31/2008   REPTSTATUS 06/14/2008 FINAL 06/08/2008   GRAMSTAIN  06/08/2008    MODERATE WBC PRESENT,BOTH PMN AND MONONUCLEAR MODERATE SQUAMOUS EPITHELIAL CELLS PRESENT MODERATE GRAM POSITIVE COCCI IN PAIRS IN CLUSTERS IN CHAINS FEW GRAM POSITIVE RODS RARE GRAM NEGATIVE RODS   CULT NO GROWTH 5 DAYS 06/08/2008   LABORGA Multiple bacterial morphotypes present, none 06/20/2013   LABORGA predominant. Suggest appropriate recollection if 06/20/2013   LABORGA clinically indicated. 06/20/2013     Lab Results  Component Value  Date   ALBUMIN 4.7 06/22/2013   ALBUMIN 4.6 06/20/2013   ALBUMIN 2.6 (L) 05/31/2008    No results found for: MG Lab Results  Component Value Date   VD25OH 9 (L) 04/25/2019    No results found for: PREALBUMIN CBC EXTENDED Latest Ref Rng & Units 04/25/2019 01/26/2017 06/22/2013  WBC 3.8 - 10.8 Thousand/uL 4.9 5.8 4.9  RBC 3.80 - 5.10 Million/uL 4.21 4.44 4.48  HGB 11.7 - 15.5 g/dL 41.2 87.8 67.6  HCT 72.0 - 45.0 % 38.3 39.4 39.6  PLT 140 - 400 Thousand/uL 247 230 249  NEUTROABS 1.7 - 7.7 K/uL - - 2.9  LYMPHSABS 0.7 - 4.0 K/uL - - 1.5     Body mass index is 30.18 kg/m.  Orders:  No orders of the defined types were placed in this encounter.  No orders of the defined types were placed in this encounter.    Procedures: No procedures performed  Clinical Data: No additional findings.  ROS:  All other systems negative, except as noted in the HPI. Review of Systems  Objective: Vital Signs: Ht 5\' 2"  (1.575 m)   Wt 165 lb (74.8 kg)   BMI 30.18 kg/m   Specialty Comments:  No specialty comments available.  PMFS History: Patient Active Problem List   Diagnosis Date Noted   Dysmenorrhea 03/13/2014   Menorrhagia with regular cycle 03/13/2014   IUD (intrauterine device) in place 12/28/2013   Family history of  breast cancer in female 07/14/2013   H. pylori infection 06/23/2013   GERD (gastroesophageal reflux disease) 06/22/2013   Visit for preventive health examination 06/22/2013   ANEMIA, MILD 07/24/2008   History reviewed. No pertinent past medical history.  Family History  Problem Relation Age of Onset   Breast cancer Mother 29       deceased 46   Hypertension Father    Breast cancer Maternal Grandmother 55       deceased 78s    Past Surgical History:  Procedure Laterality Date   CESAREAN SECTION     Social History   Occupational History   Not on file  Tobacco Use   Smoking status: Light Smoker   Smokeless tobacco: Never  Vaping Use   Vaping Use:  Never used  Substance and Sexual Activity   Alcohol use: Yes    Comment: socially   Drug use: No   Sexual activity: Yes    Partners: Male    Comment: 1si intercourse-21, partners- 5+, married- 8 yrs

## 2021-01-31 ENCOUNTER — Ambulatory Visit
Admission: RE | Admit: 2021-01-31 | Discharge: 2021-01-31 | Disposition: A | Discharge status: Home / Self Care | Payer: Medicaid Other | Source: Ambulatory Visit | Attending: Obstetrics & Gynecology | Admitting: Obstetrics & Gynecology

## 2021-01-31 ENCOUNTER — Other Ambulatory Visit: Payer: Self-pay

## 2021-01-31 ENCOUNTER — Other Ambulatory Visit: Payer: Self-pay | Admitting: Obstetrics & Gynecology

## 2021-01-31 DIAGNOSIS — N6313 Unspecified lump in the right breast, lower outer quadrant: Secondary | ICD-10-CM | POA: Diagnosis not present

## 2021-01-31 DIAGNOSIS — N6315 Unspecified lump in the right breast, overlapping quadrants: Secondary | ICD-10-CM

## 2021-01-31 IMAGING — US US BREAST*R* LIMITED INC AXILLA
1 series · 5 of 5 positions shown · non-contrast
Comparison: [DATE].

CLINICAL DATA: Short-term interval follow-up of a likely benign
mass involving the LOWER OUTER QUADRANT of the RIGHT breast at the 7
o'clock position 3 cm from the nipple.

Family history of breast cancer in her mother who was diagnosed at
age 43 and died at age 57, and in her maternal grandmother.
EXAM:
ULTRASOUND OF THE RIGHT BREAST

[Series 1: us breast*right* limited inc axilla · 0.06mm/px · 5 of 5 slices shown]
[im 1/5]
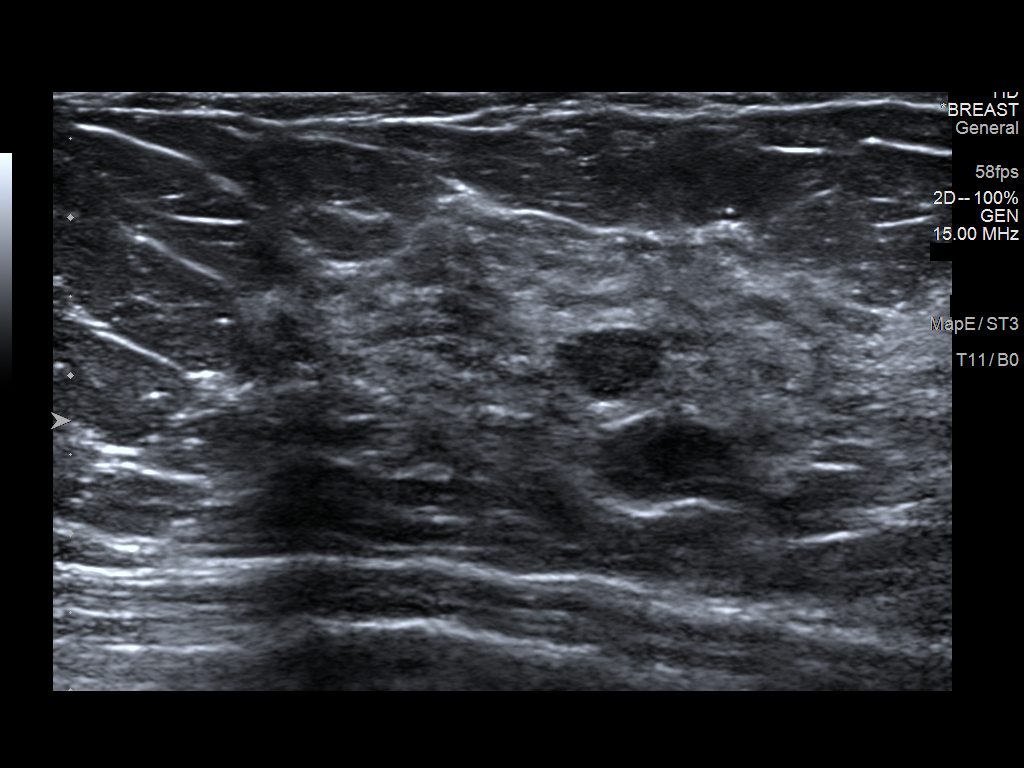
[im 2/5]
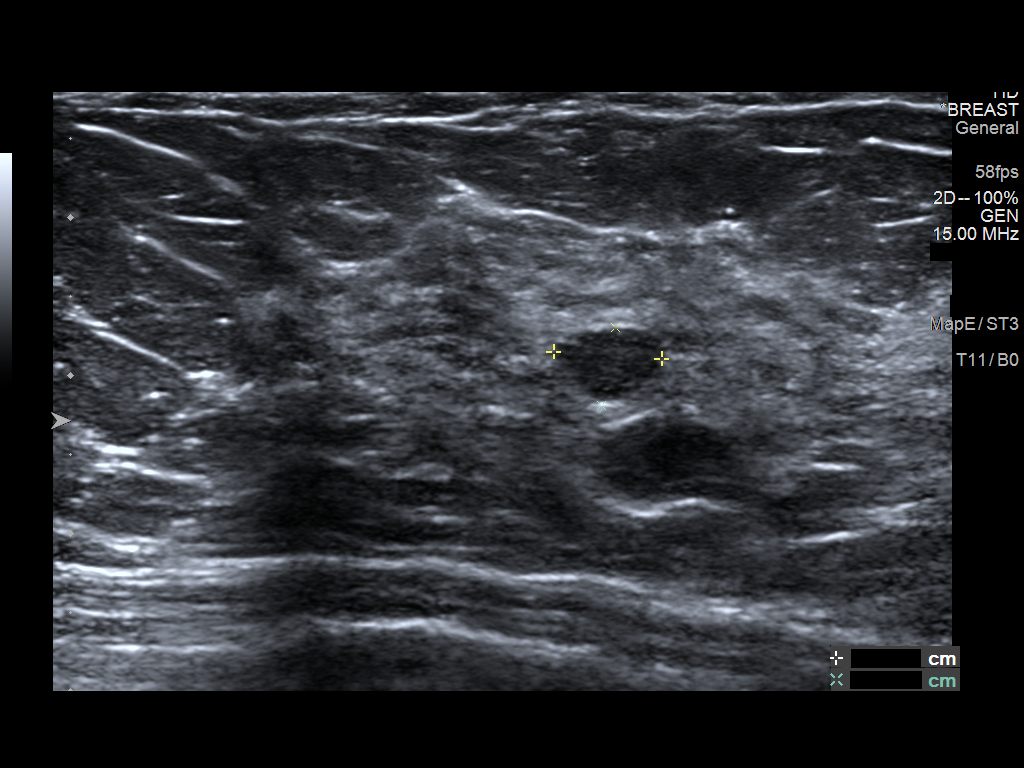
[im 3/5]
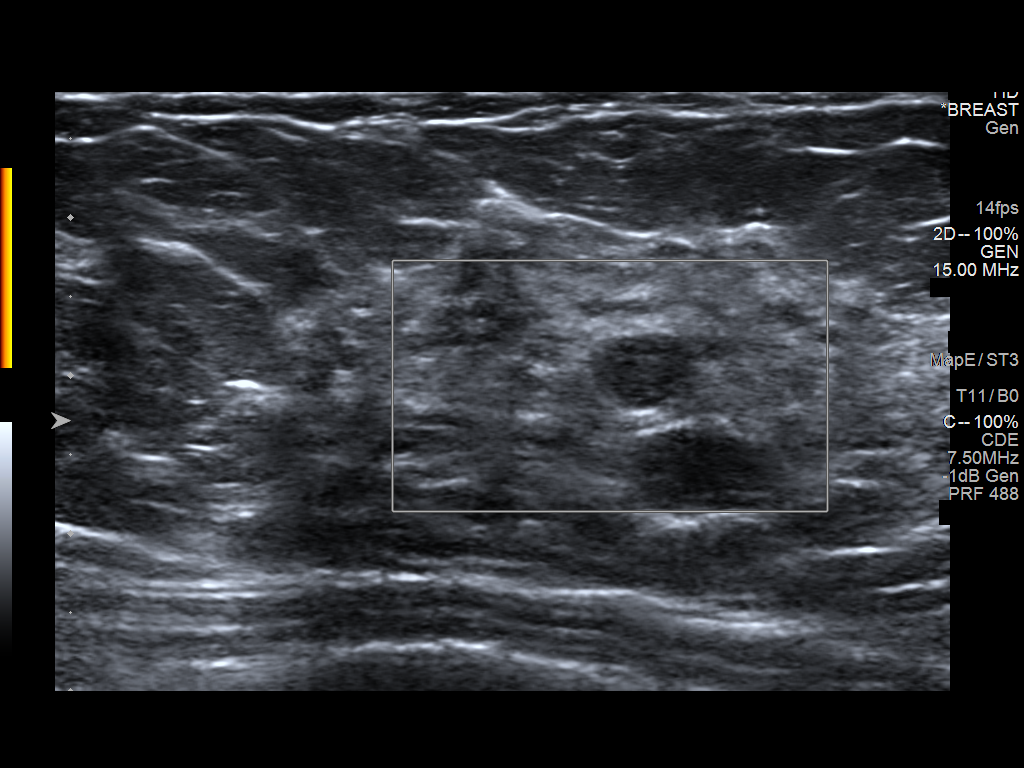
[im 4/5]
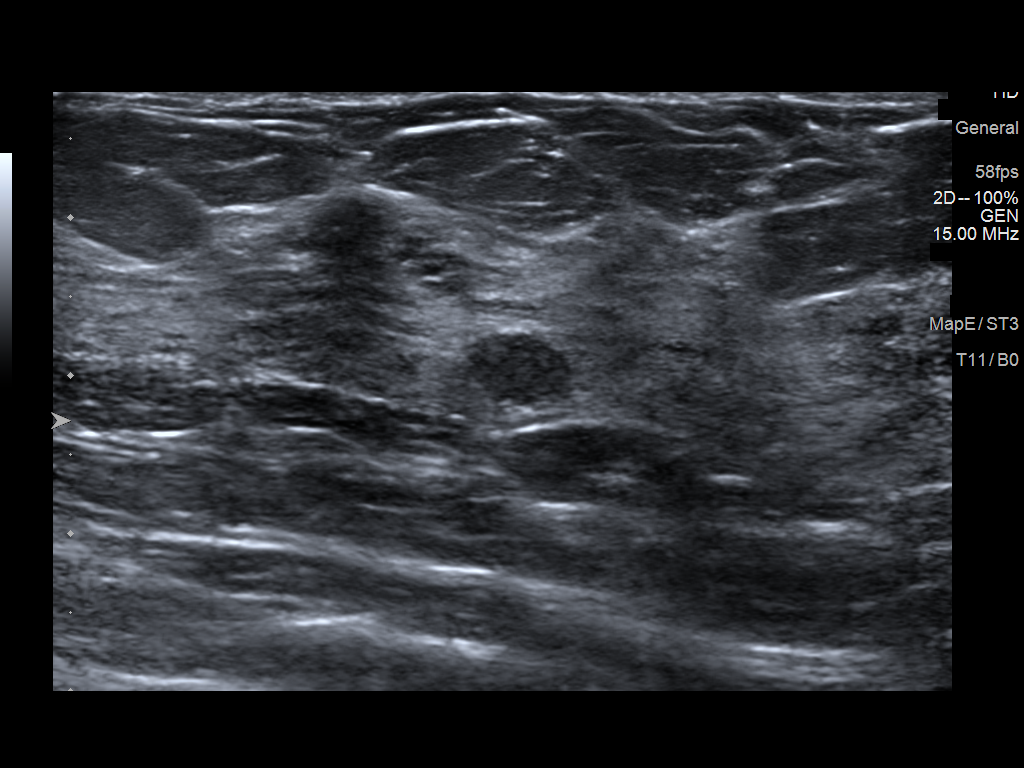
[im 5/5]
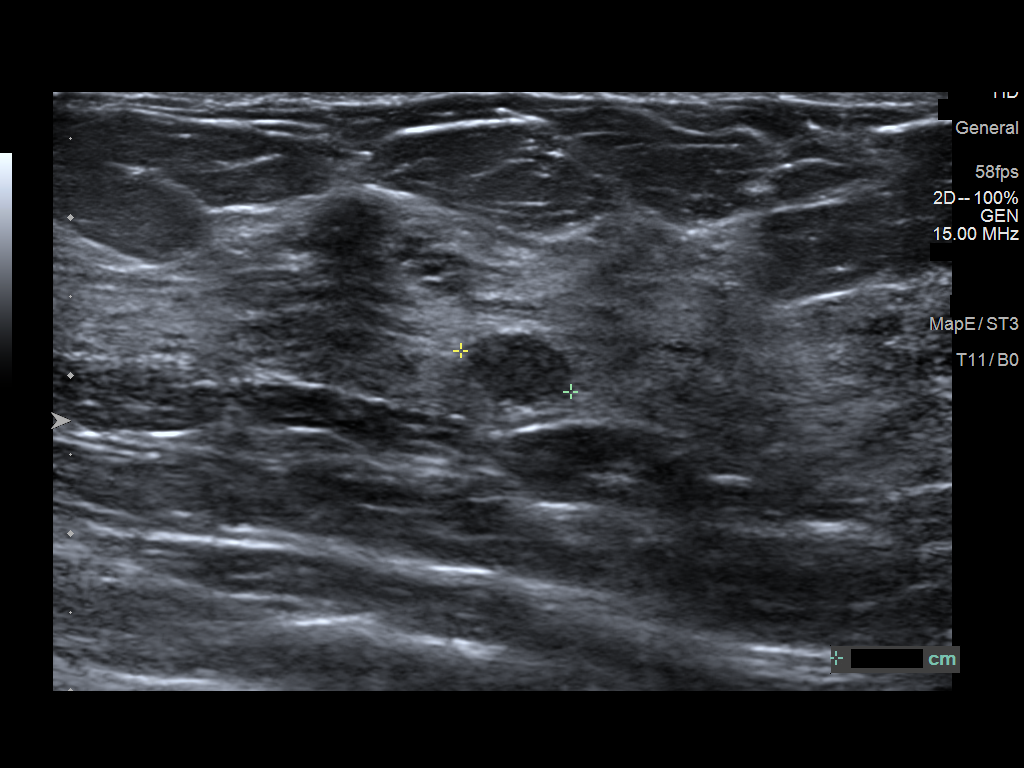

[5 of 5 positions shown; findings below may reference images not displayed]

FINDINGS: Targeted ultrasound is performed, showing the previously identified
circumscribed oval parallel hypoechoic mass at the 7 o'clock
position 3 cm from the nipple at middle to posterior depth,
measuring approximately 7 x 5 x 7 mm (previously 7 x 4 x 7 mm on
[DATE]), demonstrating slight posterior acoustic enhancement and
no internal power Doppler flow.
IMPRESSION: Stable likely benign 7 mm mass in the LOWER OUTER QUADRANT of the
RIGHT breast at the 7 o'clock position 3 cm from nipple.

RECOMMENDATION:
RIGHT breast ultrasound at the time of annual BILATERAL diagnostic
mammography in 6 months.

I have discussed the findings and recommendations with the patient.
Communication with the patient was achieved with the assistance of a
certified interpreter. If applicable, a reminder letter will be sent
to the patient regarding the next appointment.

BI-RADS CATEGORY  3: Probably benign.

## 2021-08-02 ENCOUNTER — Ambulatory Visit
Admission: RE | Admit: 2021-08-02 | Discharge: 2021-08-02 | Disposition: A | Discharge status: Home / Self Care | Payer: Medicaid Other | Source: Ambulatory Visit | Attending: Obstetrics & Gynecology | Admitting: Obstetrics & Gynecology

## 2021-08-02 DIAGNOSIS — N6315 Unspecified lump in the right breast, overlapping quadrants: Secondary | ICD-10-CM

## 2021-08-02 DIAGNOSIS — N6313 Unspecified lump in the right breast, lower outer quadrant: Secondary | ICD-10-CM | POA: Diagnosis not present

## 2021-08-02 DIAGNOSIS — R922 Inconclusive mammogram: Secondary | ICD-10-CM | POA: Diagnosis not present

## 2021-08-02 IMAGING — US US BREAST*R* LIMITED INC AXILLA
1 series · 5 of 5 positions shown · non-contrast
Comparison: Previous exam(s).

CLINICAL DATA: 40-year-old female presenting for follow-up of a
likely benign right breast mass. The patient has strong family
history of breast cancer in her mother who was diagnosed at age 45
and her maternal grandmother who was diagnosed at age 43, and both
passed away in their 50s from the disease. She has had blood test
for gene mutations, which was negative.

EXAM:
DIGITAL DIAGNOSTIC BILATERAL MAMMOGRAM WITH TOMOSYNTHESIS AND CAD;
ULTRASOUND RIGHT BREAST LIMITED
TECHNIQUE: Bilateral digital diagnostic mammography and breast tomosynthesis
was performed. The images were evaluated with computer-aided
detection.; Targeted ultrasound examination of the right breast was
performed

[Series 1: us breast*right* limited inc axilla · 0.06mm/px · 5 of 5 slices shown]
[im 1/5]
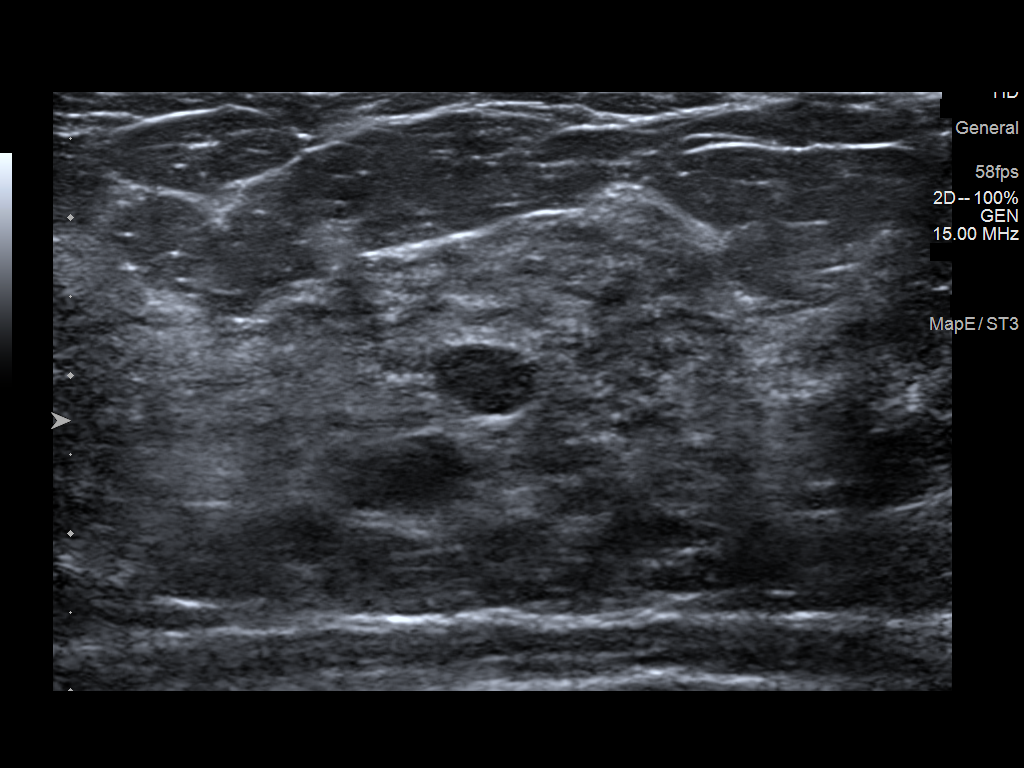
[im 2/5]
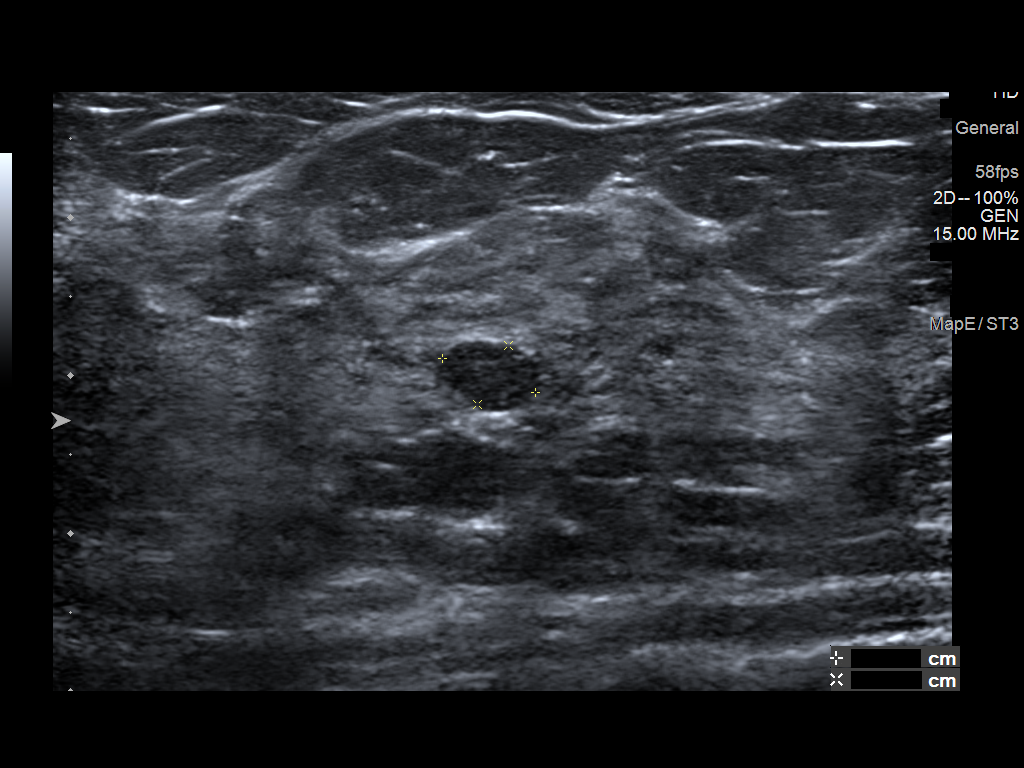
[im 3/5]
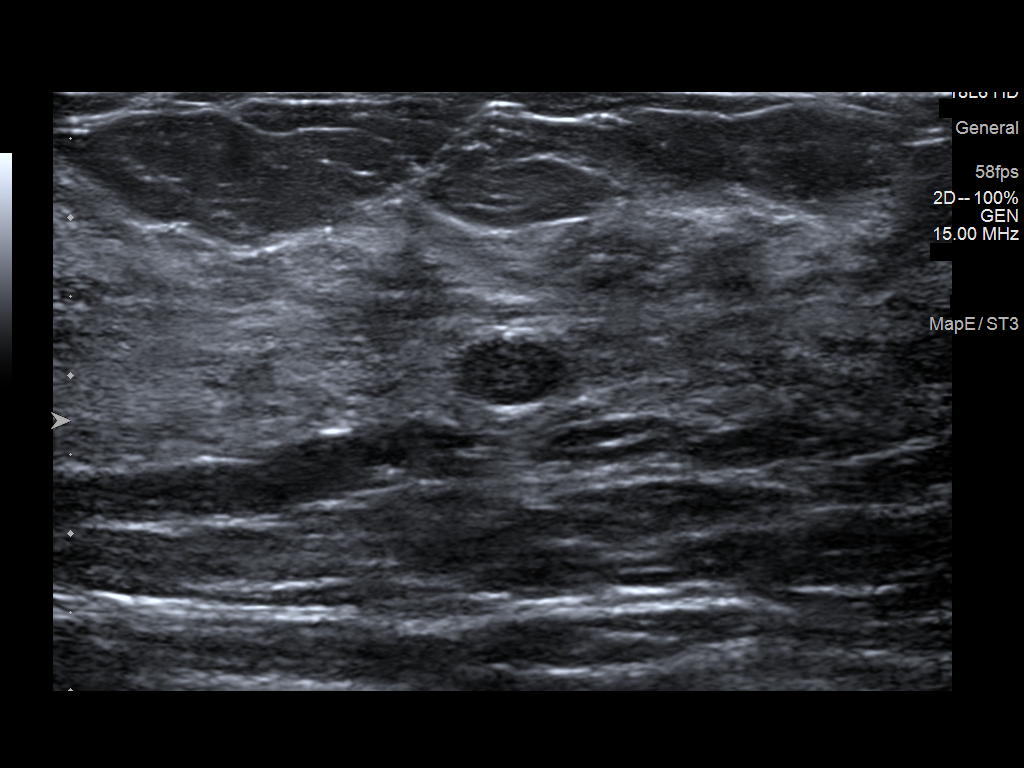
[im 4/5]
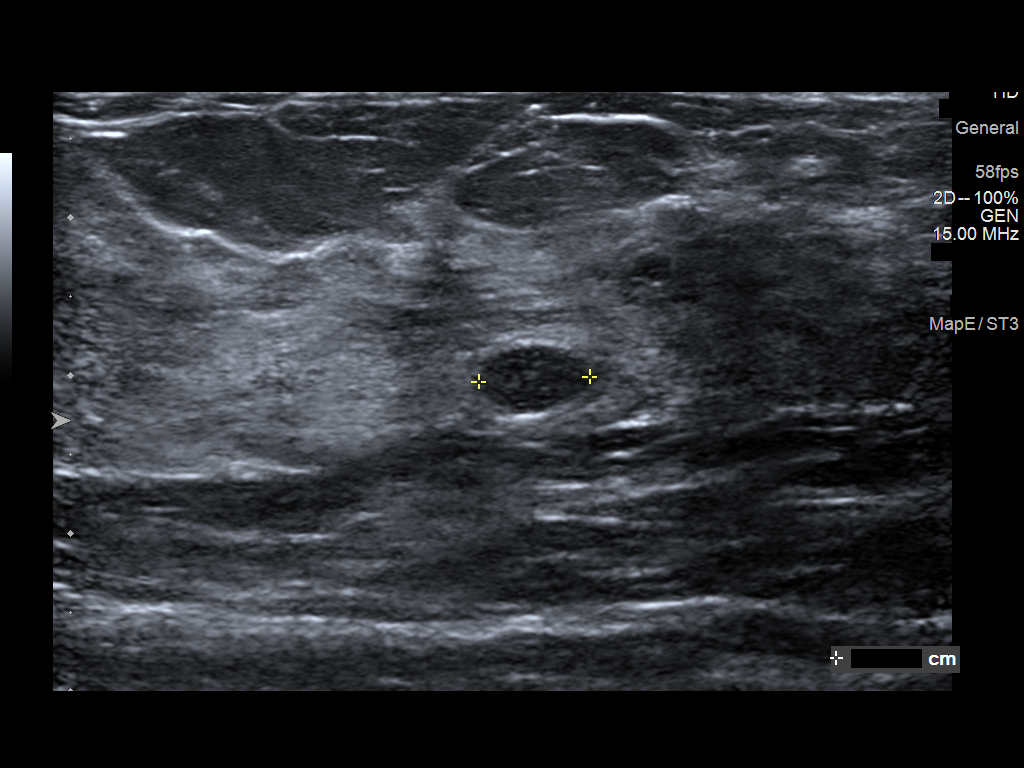
[im 5/5]
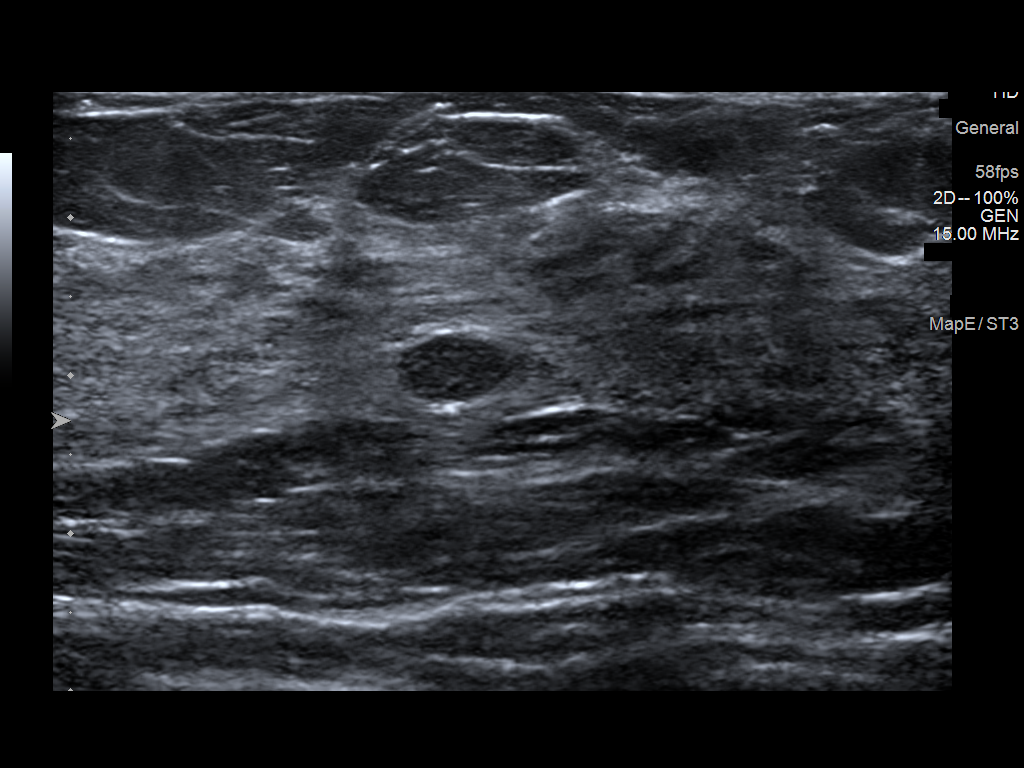

[5 of 5 positions shown; findings below may reference images not displayed]

ACR Breast Density Category d: The breast tissue is extremely dense,
which lowers the sensitivity of mammography.
FINDINGS: No suspicious calcifications, masses or areas of distortion are seen
in the bilateral breasts.

Ultrasound targeted to the right breast at 7 o'clock, 3 cm from the
nipple demonstrates a stable oval circumscribed mass measuring 7 x 4
x 6 mm, previously 7 x 4 x 7 mm on [DATE].
IMPRESSION: 1.  The likely benign right breast mass is stable.

2.  No mammographic evidence of malignancy in the bilateral breasts.

RECOMMENDATION:
1. Bilateral diagnostic mammogram and right breast ultrasound in 1
year.

2. Consider genetics assessment to determine the patient's lifetime
risk of breast cancer given her family history, if this has not
already been performed. She has already had the blood test for gene
mutations which was negative. Per American [employer] Society
guidelines, if the patient has a calculated lifetime risk of
developing breast cancer of greater than 20%, annual screening MRI
of the breasts would be recommended at the time of screening
mammography.

I have discussed the findings and recommendations with the patient.
If applicable, a reminder letter will be sent to the patient
regarding the next appointment.

BI-RADS CATEGORY  3: Probably benign.

## 2021-08-09 ENCOUNTER — Other Ambulatory Visit (HOSPITAL_COMMUNITY)
Admission: RE | Admit: 2021-08-09 | Discharge: 2021-08-09 | Disposition: A | Discharge status: Home / Self Care | Payer: Medicaid Other | Source: Ambulatory Visit | Attending: Obstetrics & Gynecology | Admitting: Obstetrics & Gynecology

## 2021-08-09 ENCOUNTER — Telehealth: Payer: Self-pay

## 2021-08-09 ENCOUNTER — Encounter: Payer: Self-pay | Admitting: Obstetrics & Gynecology

## 2021-08-09 ENCOUNTER — Ambulatory Visit (INDEPENDENT_AMBULATORY_CARE_PROVIDER_SITE_OTHER): Payer: Medicaid Other | Admitting: Obstetrics & Gynecology

## 2021-08-09 VITALS — BP 118/76 | Ht 61.5 in | Wt 164.0 lb

## 2021-08-09 DIAGNOSIS — Z6830 Body mass index (BMI) 30.0-30.9, adult: Secondary | ICD-10-CM | POA: Diagnosis not present

## 2021-08-09 DIAGNOSIS — E6609 Other obesity due to excess calories: Secondary | ICD-10-CM | POA: Diagnosis not present

## 2021-08-09 DIAGNOSIS — Z975 Presence of (intrauterine) contraceptive device: Secondary | ICD-10-CM

## 2021-08-09 DIAGNOSIS — R922 Inconclusive mammogram: Secondary | ICD-10-CM

## 2021-08-09 DIAGNOSIS — Z01419 Encounter for gynecological examination (general) (routine) without abnormal findings: Secondary | ICD-10-CM | POA: Diagnosis not present

## 2021-08-09 DIAGNOSIS — N6315 Unspecified lump in the right breast, overlapping quadrants: Secondary | ICD-10-CM

## 2021-08-09 DIAGNOSIS — Z803 Family history of malignant neoplasm of breast: Secondary | ICD-10-CM

## 2021-08-09 MED ORDER — PHENTERMINE HCL 37.5 MG PO TABS: 37.5000 mg | ORAL_TABLET | Freq: Every day | ORAL | 2 refills | Status: DC

## 2021-08-09 NOTE — Telephone Encounter (Signed)
The number for the [facility] is [phone]. To schedule the MRI it is opt 1 then opt 4. Thanks.  ?

## 2021-08-09 NOTE — Telephone Encounter (Signed)
-----   Message from Genia Del, MD sent at 08/09/2021  3:30 PM EDT ----- ?Regarding: Bilateral Breast MRI ?Mother with Breast cancer at age 39.  Patient with extremely dense breasts Category 4.  20% lifetime risk of Breast Ca.  Bilateral Breast MRI recommended annually. ? ?

## 2021-08-09 NOTE — Progress Notes (Signed)
? ? ?[name] December 22, 1980 [id number] ? ? ?History:    41 y.o. G1P1L1  Married ?  ?RP:  Established patient presenting for annual gyn exam ?  ?HPI:  Menses normal every month.  Well on Paragard IUD x 12/2013.  No pelvic pain.  Normal secretions.  Pap 04/2019 Neg.  No h/o abnormal Pap.  Pap reflex today.  Breasts normal.  Bilateral Dx Mammo Cat 4 with Rt Breast US nodule stable, probably benign 08-13-2021.  Mother deceased from Breast Ca Dxed at 80 yo. Patient had genetic testing which was negative. 20% lifetime risk of Breast Ca, recommend Bilateral Breast MRI annually.  Mictions/BMs wnl.  BMI 30.49. Fighting weight gain.  Intermittent fasting.  Only able to loose weight when taking Phentermine.  Works many hours in [employer]. ? ? ?Past medical history,surgical history, family history and social history were all reviewed and documented in the EPIC chart. ? ?Gynecologic History ?Patient's last menstrual period was 07/31/2021. ? ?Obstetric History ?OB History  ?Gravida Para Term Preterm AB Living  ?1 1       1   ?SAB IAB Ectopic Multiple Live Births  ?           ?  ?# Outcome Date GA Lbr Len/2nd Weight Sex Delivery Anes PTL Lv  ?1 Para           ? ? ? ?ROS: A ROS was performed and pertinent positives and negatives are included in the history. ? GENERAL: No fevers or chills. HEENT: No change in vision, no earache, sore throat or sinus congestion. NECK: No pain or stiffness. CARDIOVASCULAR: No chest pain or pressure. No palpitations. PULMONARY: No shortness of breath, cough or wheeze. GASTROINTESTINAL: No abdominal pain, nausea, vomiting or diarrhea, melena or bright red blood per rectum. GENITOURINARY: No urinary frequency, urgency, hesitancy or dysuria. MUSCULOSKELETAL: No joint or muscle pain, no back pain, no recent trauma. DERMATOLOGIC: No rash, no itching, no lesions. ENDOCRINE: No polyuria, polydipsia, no heat or cold intolerance. No recent change in weight. HEMATOLOGICAL: No anemia or easy bruising or  bleeding. NEUROLOGIC: No headache, seizures, numbness, tingling or weakness. PSYCHIATRIC: No depression, no loss of interest in normal activity or change in sleep pattern.  ?  ? ?Exam: ? ? ?BP 118/76   Ht 5' 1.5" (1.562 m)   Wt 164 lb (74.4 kg)   LMP 07/31/2021   BMI 30.49 kg/m?  ? ?Body mass index is 30.49 kg/m?. ? ?General appearance : Well developed well nourished female. No acute distress ?HEENT: Eyes: no retinal hemorrhage or exudates,  Neck supple, trachea midline, no carotid bruits, no thyroidmegaly ?Lungs: Clear to auscultation, no rhonchi or wheezes, or rib retractions  ?Heart: Regular rate and rhythm, no murmurs or gallops ?Breast:Examined in sitting and supine position were symmetrical in appearance, no palpable masses or tenderness,  no skin retraction, no nipple inversion, no nipple discharge, no skin discoloration, no axillary or supraclavicular lymphadenopathy ?Abdomen: no palpable masses or tenderness, no rebound or guarding ?Extremities: no edema or skin discoloration or tenderness ? ?Pelvic: Vulva: Normal ?            Vagina: No gross lesions or discharge ? Cervix: No gross lesions or discharge.  IUD string short, but felt at the EO of the cervix.  Pap reflex today. ? Uterus  AV, normal size, shape and consistency, non-tender and mobile ? Adnexa  Without masses or tenderness ? Anus: Normal ? ? ?Assessment/Plan:  41 y.o. female for annual exam  ? ?  1. Encounter for routine gynecological examination with Papanicolaou smear of cervix ?Menses normal every month.  Well on Paraguard IUD x 12/2013.  No pelvic pain.  Normal secretions.  Pap 04/2019 Neg.  No h/o abnormal Pap.  Pap reflex today.  Breasts normal.  Bilateral Dx Mammo Cat 4 with Rt Breast US nodule stable, probably benign 08/19/21.  Mother deceased from Breast Ca Dxed at 10 yo. Patient had genetic testing which was negative. 20% lifetime risk of Breast Ca, recommend Bilateral Breast MRI annually.  Mictions/BMs wnl.  BMI 30.49. Fighting weight  gain.  Intermittent fasting.  Only able to loose weight when taking Phentermine.  Works many hours in [employer]. ?- Cytology - PAP( [place]) ?- CBC; Future ?- Comp Met (CMET); Future ?- TSH; Future ?- Lipid Profile; Future ?- Vitamin D (25 hydroxy); Future ? ?2. IUD (intrauterine device) in place ?Well on Paraguard IUD x 12/2013.  IUD in good position. ? ?3. Breast lump on right side at 6 o'clock position ?Stable on last Rt  Dx mammo/Breast US. ? ?4. Family history of breast cancer in female ?Mother with Breast Ca at age 24.   Patient has a 20% lifetime risk of Breast Ca.  Genetic screening Neg.  Recommendation for MRI of the breasts annually. ? ?5. Class 1 obesity due to excess calories without serious comorbidity with body mass index (BMI) of 30.0 to 30.9 in adult ?Low calorie/carb diet.  Regular physical activities. ? ?Other orders ?- Multiple Vitamin (MULTIVITAMIN) tablet; Take 1 tablet by mouth daily. ?- phentermine (ADIPEX-P) 37.5 MG tablet; Take 1 tablet (37.5 mg total) by mouth daily before breakfast.  ? ?Princess Bruins MD, 2:50 PM 08/09/2021 ? ?  ?

## 2021-08-09 NOTE — Telephone Encounter (Signed)
I have placed order for breast MRI.  ? ?Please call pt and have her call [facility] to get MRI scheduled. Once appt is scheduled, we will send to our benefits specialist to check her insurance. Thanks.  ?

## 2021-08-11 ENCOUNTER — Encounter: Payer: Self-pay | Admitting: Obstetrics & Gynecology

## 2021-08-12 NOTE — Telephone Encounter (Signed)
Per Elane Fritz: ?"Patient was given information for [facility] to schedule her MRI appt." ? ?Scheduled for 08/24/21.  ?

## 2021-08-13 NOTE — Telephone Encounter (Signed)
PA faxed via Epic. ?

## 2021-08-14 LAB — CYTOLOGY - PAP: Diagnosis: NEGATIVE

## 2021-08-14 NOTE — Telephone Encounter (Signed)
Prior Authorization Obtained. ? ?PA#: [id number] valid 08/14/2021-10/12/2021 ? ?Encounter closed. ?

## 2021-08-16 ENCOUNTER — Other Ambulatory Visit: Payer: Medicaid Other

## 2021-08-16 DIAGNOSIS — Z01419 Encounter for gynecological examination (general) (routine) without abnormal findings: Secondary | ICD-10-CM | POA: Diagnosis not present

## 2021-08-17 LAB — CBC
HCT: 40.5 % (ref 35.0–45.0)
Hemoglobin: 13.5 g/dL (ref 11.7–15.5)
MCH: 31 pg (ref 27.0–33.0)
MCHC: 33.3 g/dL (ref 32.0–36.0)
MCV: 92.9 fL (ref 80.0–100.0)
MPV: 11.7 fL (ref 7.5–12.5)
Platelets: 234 10*3/uL (ref 140–400)
RBC: 4.36 10*6/uL (ref 3.80–5.10)
RDW: 12.2 % (ref 11.0–15.0)
WBC: 4.7 10*3/uL (ref 3.8–10.8)

## 2021-08-17 LAB — COMPREHENSIVE METABOLIC PANEL
AG Ratio: 1.8 (calc) (ref 1.0–2.5)
ALT: 20 U/L (ref 6–29)
AST: 16 U/L (ref 10–30)
Albumin: 4.4 g/dL (ref 3.6–5.1)
Alkaline phosphatase (APISO): 46 U/L (ref 31–125)
BUN: 7 mg/dL (ref 7–25)
CO2: 28 mmol/L (ref 20–32)
Calcium: 9.1 mg/dL (ref 8.6–10.2)
Chloride: 105 mmol/L (ref 98–110)
Creat: 0.56 mg/dL (ref 0.50–0.97)
Globulin: 2.4 g/dL (calc) (ref 1.9–3.7)
Glucose, Bld: 99 mg/dL (ref 65–99)
Potassium: 4.1 mmol/L (ref 3.5–5.3)
Sodium: 138 mmol/L (ref 135–146)
Total Bilirubin: 0.3 mg/dL (ref 0.2–1.2)
Total Protein: 6.8 g/dL (ref 6.1–8.1)

## 2021-08-17 LAB — LIPID PANEL
Cholesterol: 173 mg/dL (ref <200)
HDL: 58 mg/dL (ref >=50)
LDL Cholesterol (Calc): 93 mg/dL (calc)
Non-HDL Cholesterol (Calc): 115 mg/dL (calc) (ref <130)
Total CHOL/HDL Ratio: 3 (calc) (ref <5.0)
Triglycerides: 127 mg/dL (ref <150)

## 2021-08-17 LAB — TSH: TSH: 3.45 mIU/L

## 2021-08-17 LAB — VITAMIN D 25 HYDROXY (VIT D DEFICIENCY, FRACTURES): Vit D, 25-Hydroxy: 22 ng/mL — ABNORMAL LOW (ref 30–100)

## 2021-08-24 ENCOUNTER — Ambulatory Visit
Admission: RE | Admit: 2021-08-24 | Discharge: 2021-08-24 | Disposition: A | Discharge status: Home / Self Care | Payer: Medicaid Other | Source: Ambulatory Visit | Attending: Obstetrics & Gynecology | Admitting: Obstetrics & Gynecology

## 2021-08-24 DIAGNOSIS — R922 Inconclusive mammogram: Secondary | ICD-10-CM

## 2021-08-24 DIAGNOSIS — R9230 Dense breasts, unspecified: Secondary | ICD-10-CM

## 2021-08-24 DIAGNOSIS — Z803 Family history of malignant neoplasm of breast: Secondary | ICD-10-CM

## 2021-08-24 DIAGNOSIS — Z853 Personal history of malignant neoplasm of breast: Secondary | ICD-10-CM | POA: Diagnosis not present

## 2021-08-24 IMAGING — MR MR BREAST BILAT WO/W CM
8 of 12 series · 31 of 48 positions shown · IV contrast (gadavist)
Comparison: No prior MRI available for comparison. Correlation made
with prior mammogram and ultrasound images.

CLINICAL DATA: 40-year-old female presenting for baseline high risk
screening MRI. She has family history of breast cancer in her mother
and her maternal grandmother diagnosed in their 40s. She has had a
negative blood test for gene mutations.

EXAM:
BILATERAL BREAST MRI WITH AND WITHOUT CONTRAST
TECHNIQUE: Multiplanar, multisequence MR images of both breasts were obtained
prior to and following the intravenous administration of 7 ml of
Gadavist

[Series 2: t2_tirm_tra ipat (a-p) · axial · 3.0mm · 0.70mm/px · 1 of 55 slices shown]
[im 1/55]
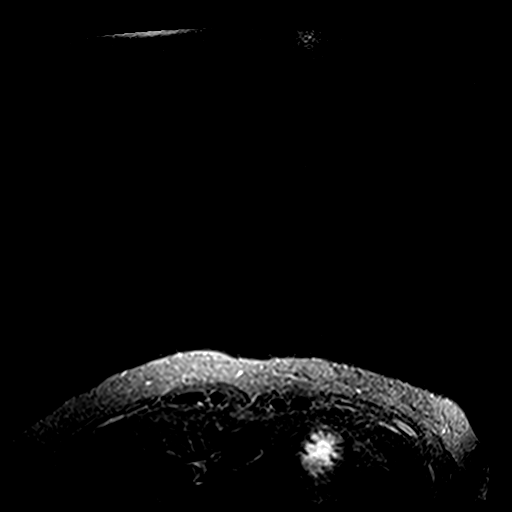

[Series 3: fl3d pre-cm no · axial · non-contrast · 1.2mm · 0.94mm/px · z∈[-98,+74]mm · 5 of 144 slices shown]
[im 1/144]
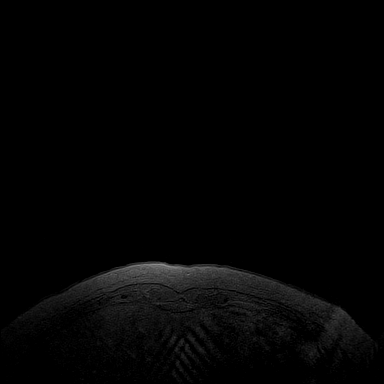
[im 36/144]
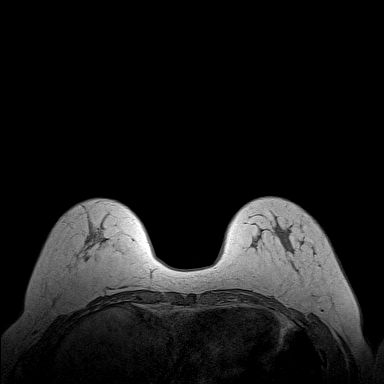
[im 72/144]
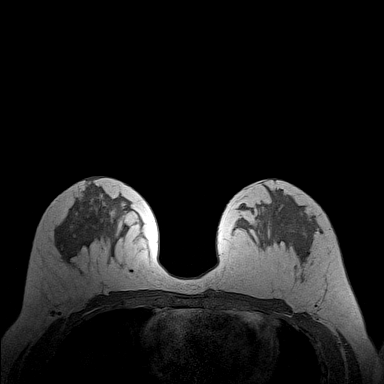
[im 108/144]
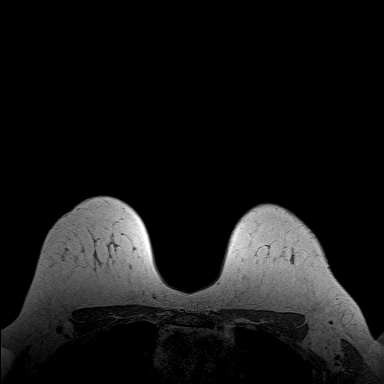
[im 144/144]
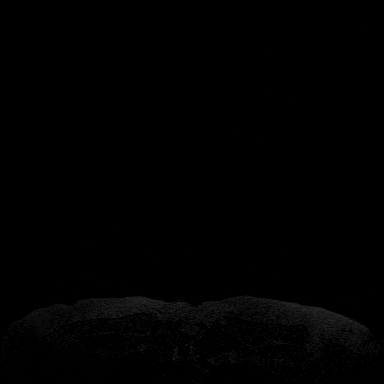

[Series 4: fl3d pre-cm · axial · non-contrast · 1.2mm · 0.94mm/px · z∈[-98,+74]mm · 5 of 144 slices shown]
[im 1/144]
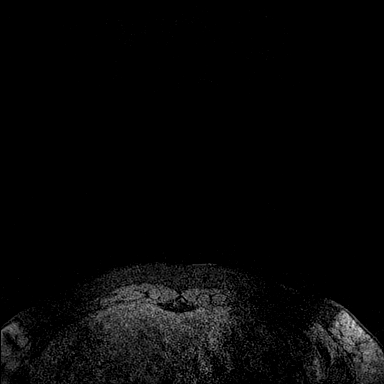
[im 36/144]
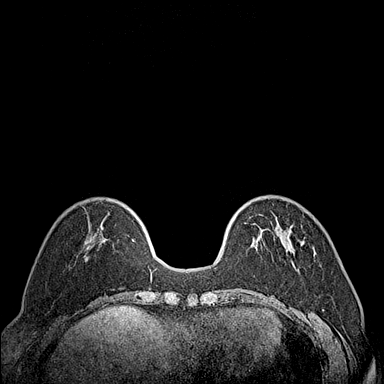
[im 72/144]
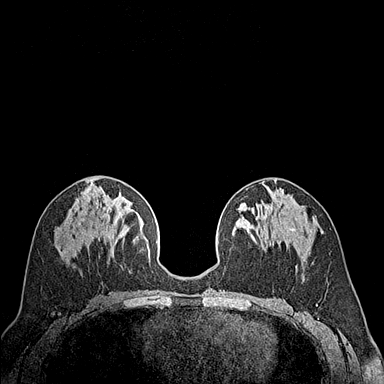
[im 108/144]
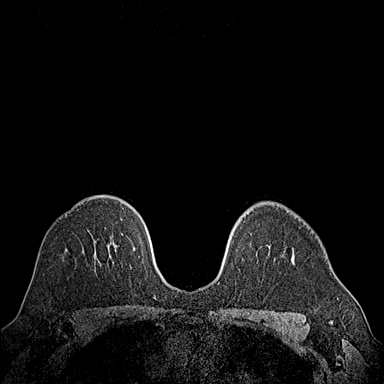
[im 144/144]
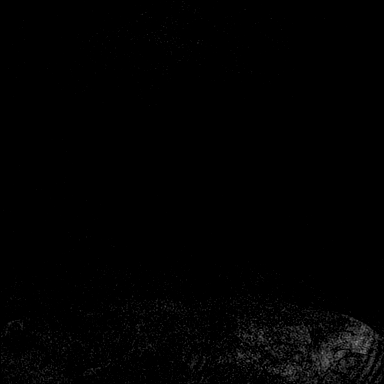

[Series 5: fl3d post-cm 20 · axial · 1.2mm · 0.94mm/px · z∈[-98,+74]mm · 5 of 144 slices shown (1 of 3)]
[im 1/144]
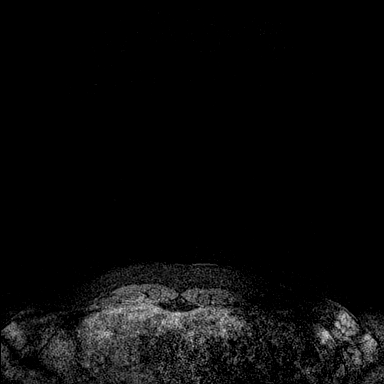
[im 36/144]
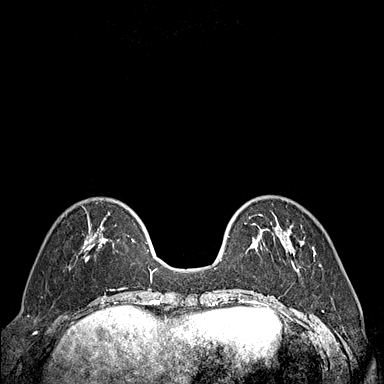
[im 72/144]
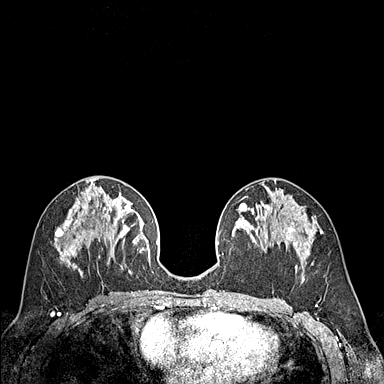
[im 108/144]
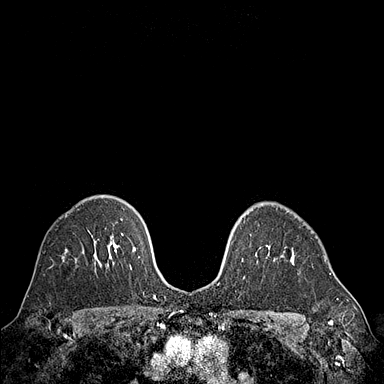
[im 144/144]
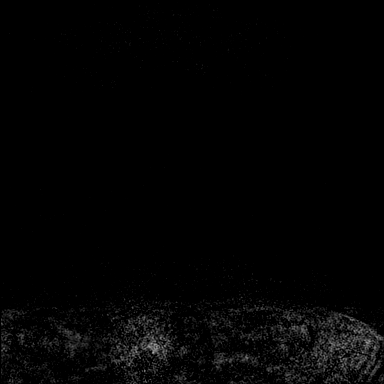

[Series 6: fl3d post-cm 20 · axial · 1.2mm · 0.94mm/px · z∈[-98,+74]mm · 5 of 144 slices shown (2 of 3)]
[im 1/144]
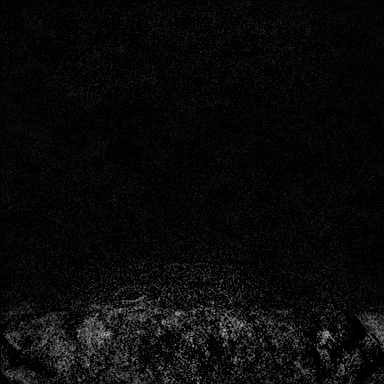
[im 36/144]
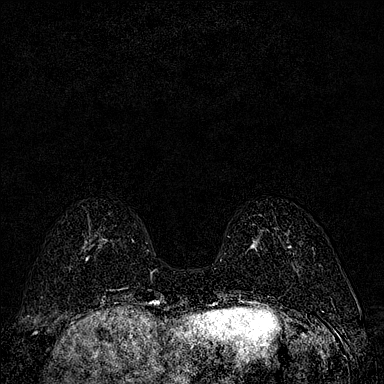
[im 72/144]
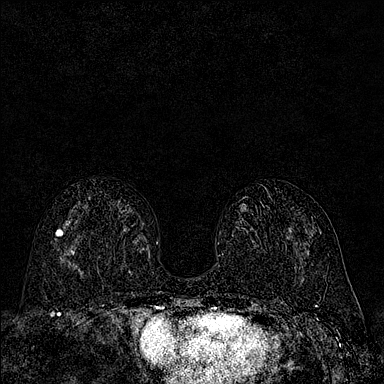
[im 108/144]
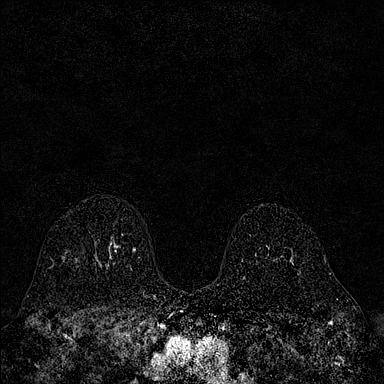
[im 144/144]
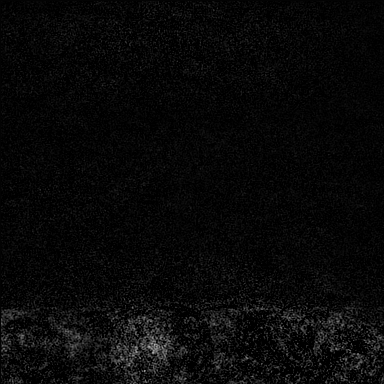

[Series 7: fl3d post-cm 20 · axial · 172.8mm · 0.94mm/px · 1 of 1 slices shown (3 of 3)]
[im 1/1]
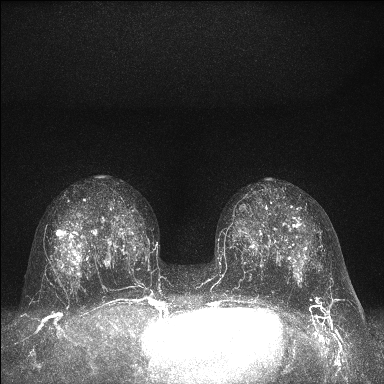

[Series 8: fl3d post-cm 3 · axial · 1.2mm · 0.94mm/px · z∈[-98,+74]mm · 6 of 144 slices shown (1 of 2)]
[im 1/144]
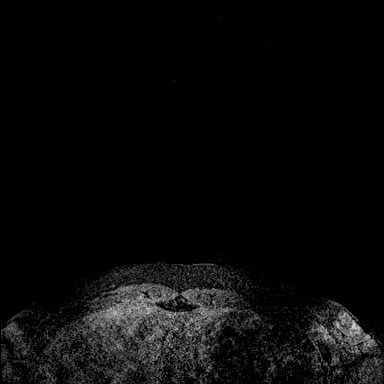
[im 29/144]
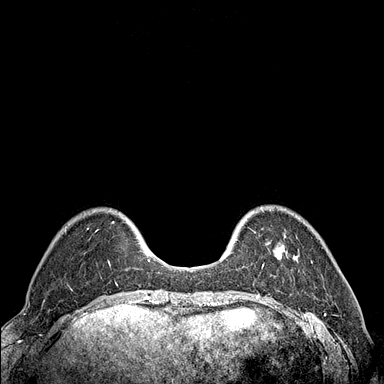
[im 58/144]
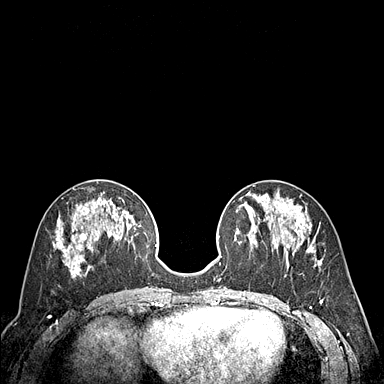
[im 86/144]
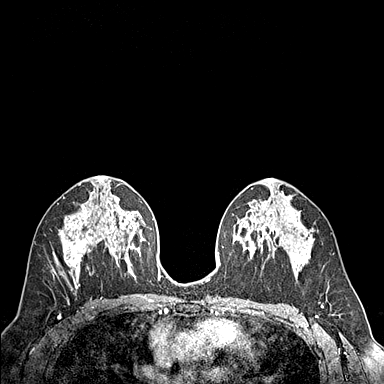
[im 115/144]
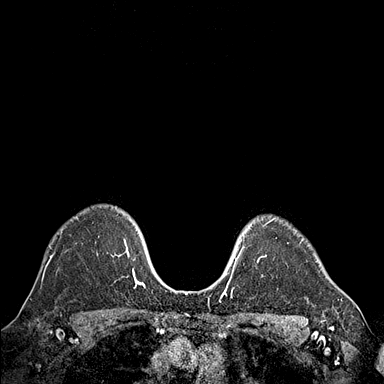
[im 144/144]
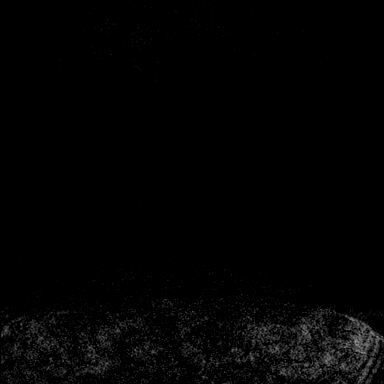

[Series 9: fl3d post-cm 3 · axial · 1.2mm · 0.94mm/px · z∈[-98,-29]mm · 3 of 144 slices shown (2 of 2)]
[im 1/144]
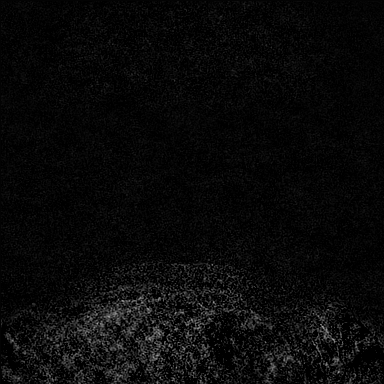
[im 29/144]
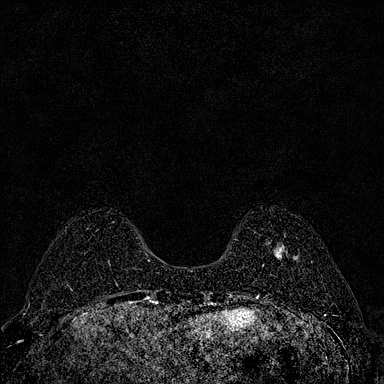
[im 58/144]
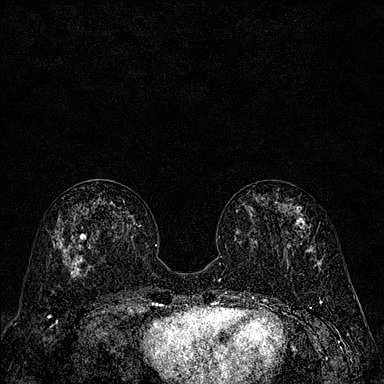

[31 of 48 positions shown; findings below may reference images not displayed]

Three-dimensional MR images were rendered by post-processing of the
original MR data on an independent workstation. The
three-dimensional MR images were interpreted, and findings are
reported in the following complete MRI report for this study. Three
dimensional images were evaluated at the independent interpreting
workstation using the DynaCAD thin client.
FINDINGS: Breast composition: d. Extreme fibroglandular tissue.

Background parenchymal enhancement: Moderate.

Right breast: There is nodular non mass enhancement in the lateral
aspect of the right breast both in the upper outer and lower outer
quadrants. Much of this is similar but slightly asymmetric in
appearance to the contralateral breast in the same distribution.
This enhancement demonstrates plateau kinetics, and therefore is
compatible with background parenchymal enhancement.

There are two small masses within this region in the lateral right
breast that defer from the surrounding tissue include the following:

In the lower outer quadrant of the right breast, middle depth, there
is a well-defined 7 mm enhancing oval mass with plateau kinetics
(series 6, image 72). This corresponds well in size and location
with the known mass in the right breast at 7 o'clock, 3 cm from the
nipple that is currently being monitored with ultrasound.

In the lower outer posterior depth of the right breast (series 6,
image 82) there is a 6-7 mm mass with mixed kinetics.

Left breast: No mass or abnormal enhancement.

Lymph nodes: No abnormal appearing lymph nodes.

Ancillary findings:  None.
IMPRESSION: 1. There is an indeterminate 6-7 mm mass with mixed kinetics in the
lower outer posterior right breast.

2. There is a well-defined 7 mm mass in the lower outer right
breast, which likely corresponds with the mass under surveillance by
ultrasound in the right breast at 7 o'clock, 3 cm from the nipple.

3.  No evidence of left breast malignancy.

RECOMMENDATION:
1. MRI guided biopsy is recommended for the indeterminate 6-7 mm
mass in the lower outer posterior right breast (series 6, image 82).

2. The well-defined 7 mm mass in the lower outer right breast can
continue being monitored with ultrasound.

BI-RADS CATEGORY  4: Suspicious.

## 2021-08-24 MED ORDER — GADOBUTROL 1 MMOL/ML IV SOLN
7.0000 mL | Freq: Once | INTRAVENOUS | Status: AC | PRN
  Administered 2021-08-24: 7 mL via INTRAVENOUS

## 2021-08-26 ENCOUNTER — Encounter: Payer: Self-pay | Admitting: Obstetrics & Gynecology

## 2021-08-27 ENCOUNTER — Other Ambulatory Visit: Payer: Self-pay | Admitting: Obstetrics & Gynecology

## 2021-08-27 DIAGNOSIS — R928 Other abnormal and inconclusive findings on diagnostic imaging of breast: Secondary | ICD-10-CM

## 2021-08-30 NOTE — Telephone Encounter (Signed)
Per ML: "Rt breast indeterminate 6-7 mm lesion which needs MRI guided Biopsy. Recommend breast biopsy"   Its recommended to rule out malignancy.   Are you comfortable with relaying this information?

## 2021-08-30 NOTE — Telephone Encounter (Signed)
Per Elane Fritz: "Patient was informed of message below. She already has an appointment scheduled  for the breast biopsy."   FYI. Pt is scheduled for 09/09/21.   Once reviewed, can close. Thanks.

## 2021-09-03 ENCOUNTER — Other Ambulatory Visit: Payer: Self-pay | Admitting: Anesthesiology

## 2021-09-03 DIAGNOSIS — E559 Vitamin D deficiency, unspecified: Secondary | ICD-10-CM

## 2021-09-03 MED ORDER — VITAMIN D (ERGOCALCIFEROL) 1.25 MG (50000 UNIT) PO CAPS: 50000.0000 [IU] | ORAL_CAPSULE | ORAL | 0 refills | Status: DC

## 2021-09-09 ENCOUNTER — Ambulatory Visit
Admission: RE | Admit: 2021-09-09 | Discharge: 2021-09-09 | Disposition: A | Discharge status: Home / Self Care | Payer: Medicaid Other | Source: Ambulatory Visit | Attending: Obstetrics & Gynecology | Admitting: Obstetrics & Gynecology

## 2021-09-09 DIAGNOSIS — R928 Other abnormal and inconclusive findings on diagnostic imaging of breast: Secondary | ICD-10-CM

## 2021-09-09 DIAGNOSIS — N6313 Unspecified lump in the right breast, lower outer quadrant: Secondary | ICD-10-CM | POA: Diagnosis not present

## 2021-09-09 DIAGNOSIS — N6011 Diffuse cystic mastopathy of right breast: Secondary | ICD-10-CM | POA: Diagnosis not present

## 2021-09-09 IMAGING — MR MR BREAST BX W/ LOC DEV 1ST LEASION IMAGE BX SPEC MR GUIDE*R*
7 of 10 series · 33 of 48 positions shown · IV contrast (gadavist)
Comparison: Previous exams.
COMPARISON: Previous exams.

Addendum:
CLINICAL DATA: 40-year-old female presenting for biopsy of a mass
in the right breast.

EXAM:
MRI GUIDED CORE NEEDLE BIOPSY OF THE RIGHT BREAST
TECHNIQUE: Multiplanar, multisequence MR imaging of the right breast was
performed both before and after administration of intravenous
contrast.
CONTRAST:  10 ml Gadavist

[Series 3: fiducial unilateral · sagittal · 2.0mm · 1.33mm/px · 3 of 56 slices shown]
[im 1/56]
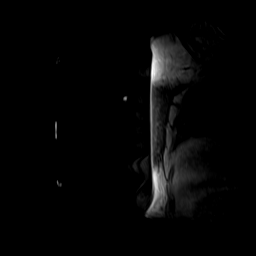
[im 28/56]
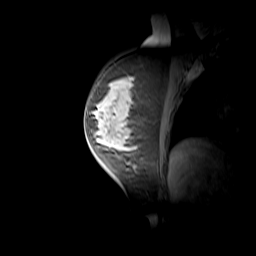
[im 56/56]
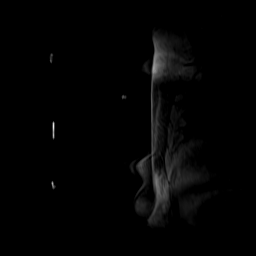

[Series 4: dynamic pre · axial · non-contrast · 1.3mm · 0.73mm/px · z∈[-104,+144]mm · 5 of 192 slices shown]
[im 1/192]
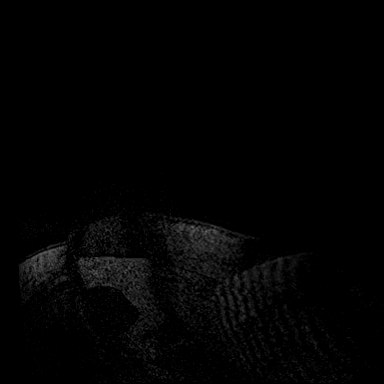
[im 48/192]
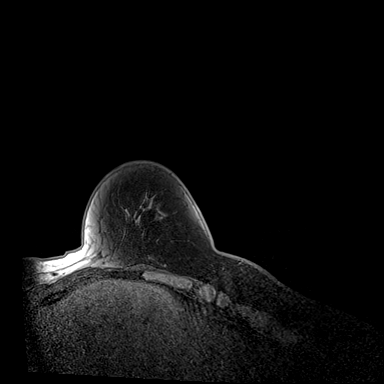
[im 96/192]
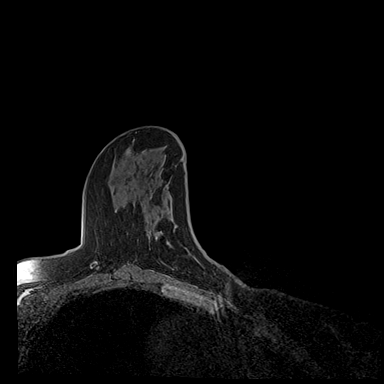
[im 144/192]
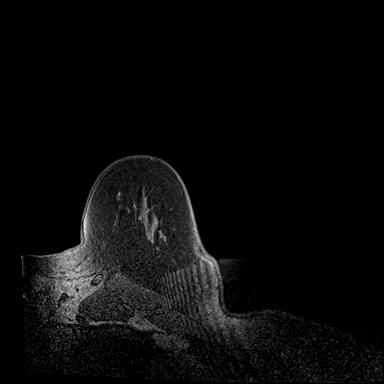
[im 192/192]
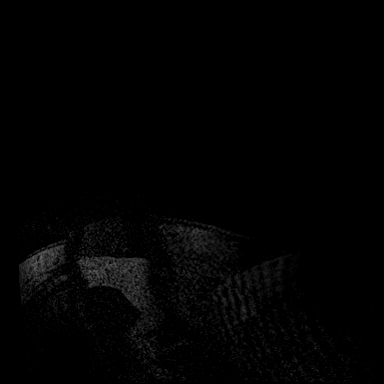

[Series 5: dynamic post 20 · axial · 1.3mm · 0.73mm/px · z∈[-104,+144]mm · 5 of 192 slices shown (1 of 2)]
[im 1/192]
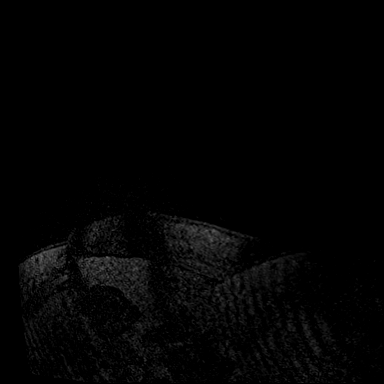
[im 48/192]
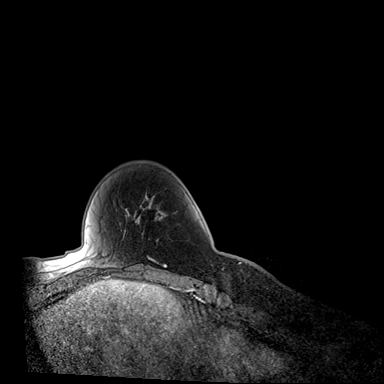
[im 96/192]
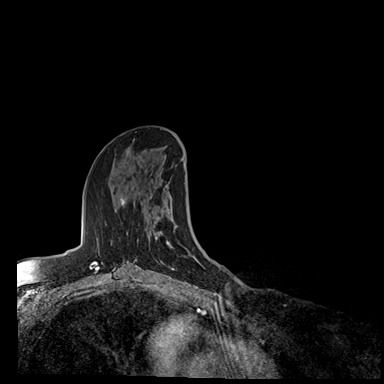
[im 144/192]
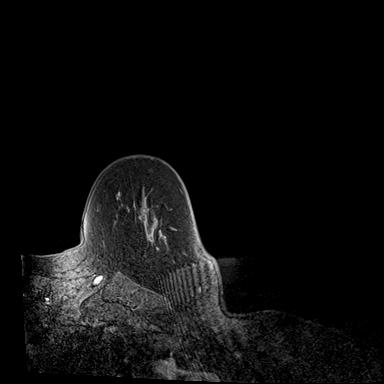
[im 192/192]
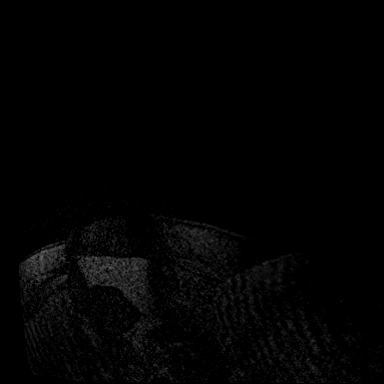

[Series 6: dynamic post 20 · axial · 1.3mm · 0.73mm/px · z∈[-104,+144]mm · 5 of 192 slices shown (2 of 2)]
[im 1/192]
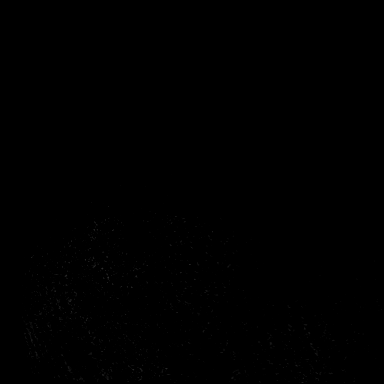
[im 48/192]
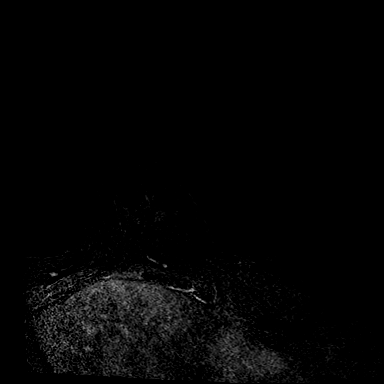
[im 96/192]
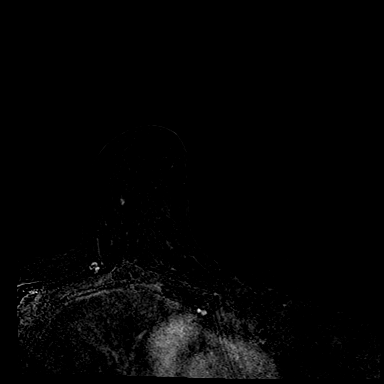
[im 144/192]
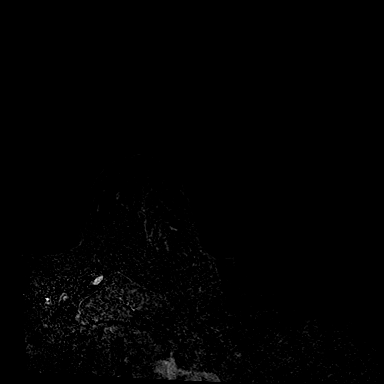
[im 192/192]
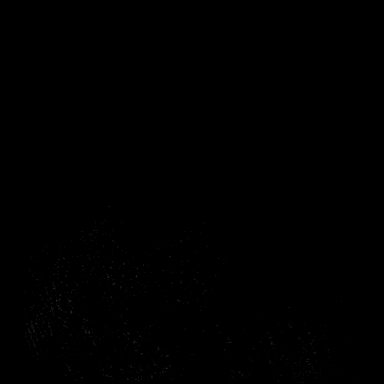

[Series 7: dynamic post 3 · axial · 1.3mm · 0.73mm/px · z∈[-104,+144]mm · 5 of 192 slices shown (1 of 2)]
[im 1/192]
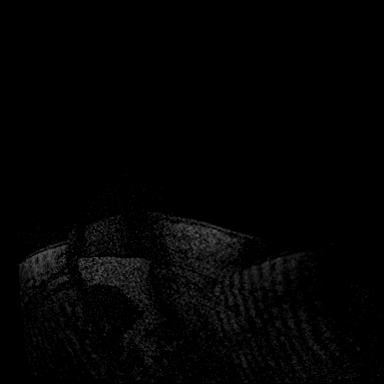
[im 48/192]
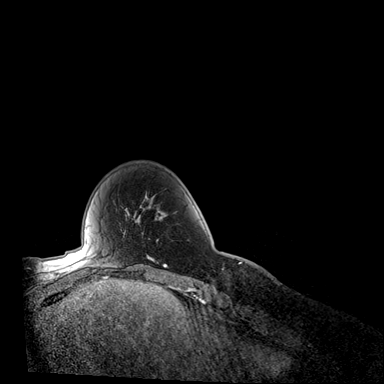
[im 96/192]
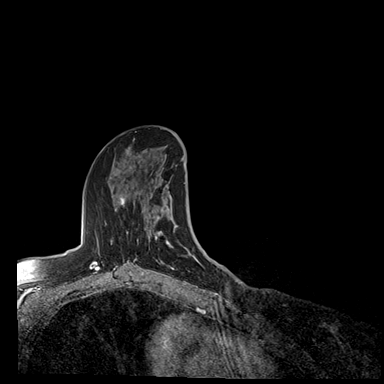
[im 144/192]
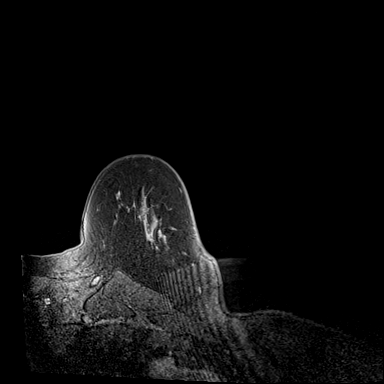
[im 192/192]
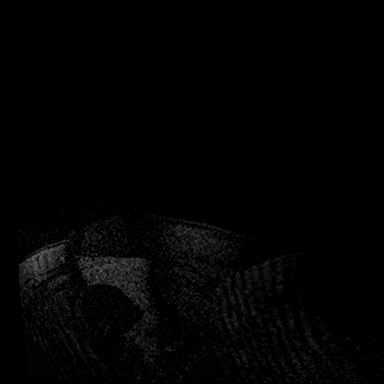

[Series 8: dynamic post 3 · axial · 1.3mm · 0.73mm/px · z∈[-104,+144]mm · 5 of 192 slices shown (2 of 2)]
[im 1/192]
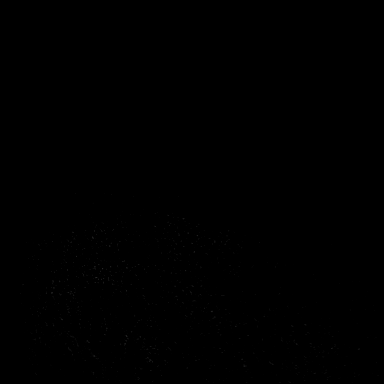
[im 48/192]
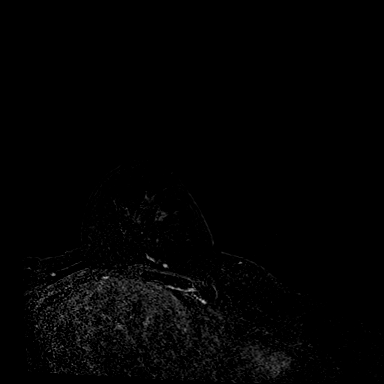
[im 96/192]
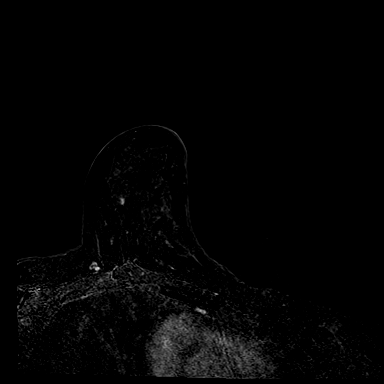
[im 144/192]
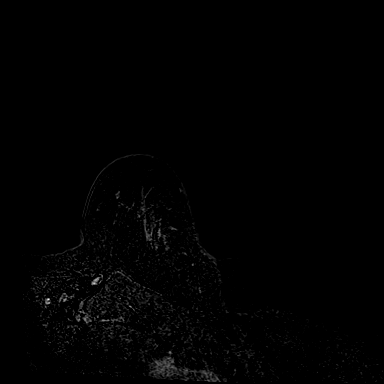
[im 192/192]
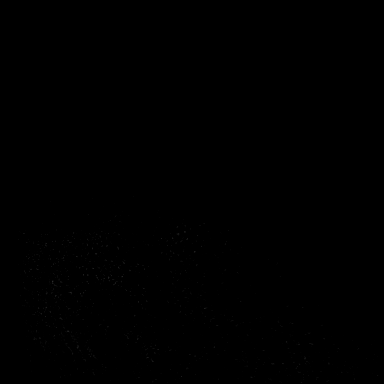

[Series 9: needle confirmation · axial · 1.3mm · 0.73mm/px · z∈[-104,+144]mm · 5 of 192 slices shown]
[im 1/192]
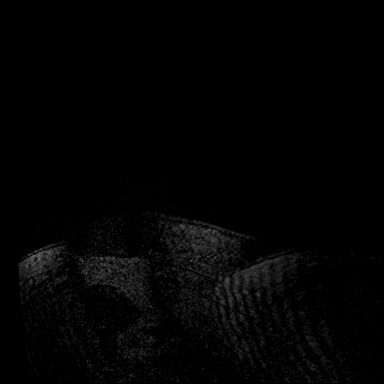
[im 48/192]
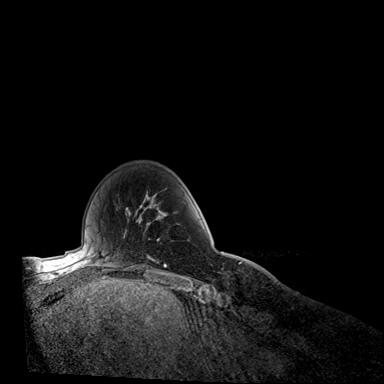
[im 96/192]
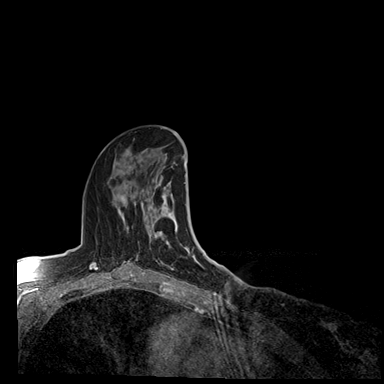
[im 144/192]
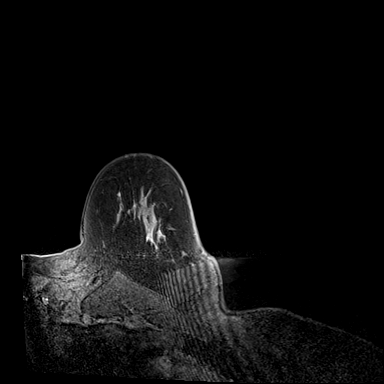
[im 192/192]
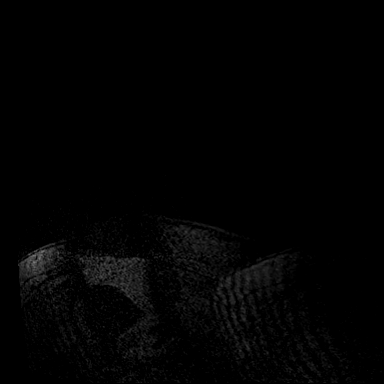

[33 of 48 positions shown; findings below may reference images not displayed]

FINDINGS: I met with the patient, and we discussed the procedure of MRI guided
biopsy, including risks, benefits, and alternatives. Specifically,
we discussed the risks of infection, bleeding, tissue injury, clip
migration, and inadequate sampling. Informed, written consent was
given. The usual time out protocol was performed immediately prior
to the procedure.

Using sterile technique, 1% Lidocaine, MRI guidance, and a 9 gauge
vacuum assisted device, biopsy was performed of a mass in the lower
outer right breast using a lateral approach. At the conclusion of
the procedure, a a barbell tissue marker clip was deployed into the
biopsy cavity. Follow-up 2-view mammogram was performed and dictated
separately.
IMPRESSION: MRI guided biopsy of a mass in the lower outer right breast. No
apparent complications.

ADDENDUM:
Pathology revealed Breast, RIGHT, needle core biopsy, lower outer
(barbell clip)- BENIGN BREAST TISSUE WITH FIBROCYSTIC CHANGE,
COLUMNAR CELL CHANGE AND A RARE ASSOCIATED MICROCALCIFICATION. This
was found to be concordant by Dr. MECHAM.

Pathology results were discussed with the patient by telephone with
[employer] Representative (MECHAM). The
patient reported doing well after the biopsy with tenderness at the
site. Post biopsy instructions and care were reviewed and questions
were answered. The patient was encouraged to call The [REDACTED]

Bilateral breast MRI recommended in 6 months per protocol.

Pathology results reported by MECHAM RN on [DATE].

*** End of Addendum ***
FINDINGS: I met with the patient, and we discussed the procedure of MRI guided
biopsy, including risks, benefits, and alternatives. Specifically,
we discussed the risks of infection, bleeding, tissue injury, clip
migration, and inadequate sampling. Informed, written consent was
given. The usual time out protocol was performed immediately prior
to the procedure.

Using sterile technique, 1% Lidocaine, MRI guidance, and a 9 gauge
vacuum assisted device, biopsy was performed of a mass in the lower
outer right breast using a lateral approach. At the conclusion of
the procedure, a a barbell tissue marker clip was deployed into the
biopsy cavity. Follow-up 2-view mammogram was performed and dictated
separately.
IMPRESSION: MRI guided biopsy of a mass in the lower outer right breast. No
apparent complications.

## 2021-09-09 MED ORDER — GADOBUTROL 1 MMOL/ML IV SOLN: 7.0000 mL | Freq: Once | INTRAVENOUS | Status: DC | PRN

## 2021-09-13 ENCOUNTER — Other Ambulatory Visit: Payer: Medicaid Other

## 2021-09-16 ENCOUNTER — Telehealth: Payer: Self-pay

## 2021-09-16 NOTE — Telephone Encounter (Signed)
Pt had diagnostic done on 08/02/21 for f/u on rt breast mass and 1 yr f/u diagnostic imaging was recommended w/ Rt breast US. However, we ordered close f/u Breast MRI on 08/24/21 due to pt risk and biopsy was recommended and done 09/09/21. However, unable to locate the specific recommendations as to what specific imaging the radiologist is recommending next. Should I contact the breast center to confirm? Please advise.

## 2021-09-16 NOTE — Telephone Encounter (Signed)
-----   Message from Reatha Armour, [place] sent at 09/16/2021 11:46 AM EDT ----- This patient called to say that she already had her MRI that was scheduled by ML. She called to ask if she needed to make a f/u appointment with ML in six months or if ML can just send a referral for her follow up  MRI that she was told she would need by the breast center. Let me know thank you

## 2021-10-01 ENCOUNTER — Telehealth: Payer: Self-pay | Admitting: *Deleted

## 2021-10-02 MED ORDER — PHENTERMINE HCL 37.5 MG PO CAPS: 37.5000 mg | ORAL_CAPSULE | ORAL | 2 refills | Status: DC

## 2021-10-02 NOTE — Telephone Encounter (Signed)
Dr.Lavoie replied "Agree with Phentermine capsules, same dosage. "    Dr.Lavoie Rx has to be signed by a provider to be sent to pharmacy. Its pending. Thanks!

## 2021-10-22 NOTE — Telephone Encounter (Signed)
Breast MRI in 02/2022.

## 2021-12-20 ENCOUNTER — Other Ambulatory Visit: Payer: Medicaid Other

## 2021-12-20 DIAGNOSIS — E559 Vitamin D deficiency, unspecified: Secondary | ICD-10-CM

## 2021-12-20 LAB — VITAMIN D 25 HYDROXY (VIT D DEFICIENCY, FRACTURES): Vit D, 25-Hydroxy: 31 ng/mL (ref 30–100)

## 2022-01-19 ENCOUNTER — Ambulatory Visit (INDEPENDENT_AMBULATORY_CARE_PROVIDER_SITE_OTHER): Payer: Medicaid Other

## 2022-01-19 ENCOUNTER — Ambulatory Visit
Admission: EM | Admit: 2022-01-19 | Discharge: 2022-01-19 | Disposition: A | Discharge status: Home / Self Care | Payer: Medicaid Other | Attending: Urgent Care | Admitting: Urgent Care

## 2022-01-19 ENCOUNTER — Encounter: Payer: Self-pay | Admitting: *Deleted

## 2022-01-19 DIAGNOSIS — R053 Chronic cough: Secondary | ICD-10-CM

## 2022-01-19 DIAGNOSIS — R0602 Shortness of breath: Secondary | ICD-10-CM

## 2022-01-19 DIAGNOSIS — M4124 Other idiopathic scoliosis, thoracic region: Secondary | ICD-10-CM | POA: Diagnosis not present

## 2022-01-19 DIAGNOSIS — R059 Cough, unspecified: Secondary | ICD-10-CM

## 2022-01-19 DIAGNOSIS — M549 Dorsalgia, unspecified: Secondary | ICD-10-CM

## 2022-01-19 DIAGNOSIS — R079 Chest pain, unspecified: Secondary | ICD-10-CM | POA: Diagnosis not present

## 2022-01-19 MED ORDER — PROMETHAZINE-DM 6.25-15 MG/5ML PO SYRP: 2.5000 mL | ORAL_SOLUTION | Freq: Three times a day (TID) | ORAL | 0 refills | Status: DC | PRN

## 2022-01-19 MED ORDER — PREDNISONE 20 MG PO TABS: ORAL_TABLET | ORAL | 0 refills | Status: DC

## 2022-01-19 NOTE — ED Triage Notes (Signed)
C/O dry cough x 1 month. States has become worse over past few days. Yesterday started with significant pain to entire back and feels short of breath. Pain worse with movement, cough, or laying down. Denies fevers. Has been taking IBU.

## 2022-01-19 NOTE — ED Provider Notes (Signed)
[facility]  Note:  This document was prepared using Conservation officer, historic buildings and may include unintentional dictation errors.  MRN: [id number] DOB: [birth year]  Subjective:   [name] is a 41 y.o. female presenting for 1 month history of persistent dry cough that has now started to cause her entire back to her.  She has also felt like she cannot take a deep breath and is worse with the cough.  Has been using ibuprofen with minimal relief.  Initially she did have sinus symptoms but these have resolved.  No wheezing, history of asthma.  No history of pulmonary embolism, sarcoidosis or any other respiratory disorder.  Patient is not a smoker.  No current facility-administered medications for this encounter.  Current Outpatient Medications:    Multiple Vitamin (MULTIVITAMIN) tablet, Take 1 tablet by mouth daily., Disp: , Rfl:    Vitamin D, Ergocalciferol, (DRISDOL) 1.25 MG (50000 UNIT) CAPS capsule, Take 1 capsule (50,000 Units total) by mouth every 7 (seven) days., Disp: 12 capsule, Rfl: 0   phentermine 37.5 MG capsule, Take 1 capsule (37.5 mg total) by mouth every morning., Disp: 30 capsule, Rfl: 2   No Known Allergies  History reviewed. No pertinent past medical history.   Past Surgical History:  Procedure Laterality Date   CESAREAN SECTION      Family History  Problem Relation Age of Onset   Breast cancer Mother 11       deceased 75   Cancer Father        prostate   Hypertension Father    Breast cancer Maternal Grandmother 46       deceased 23s    Social History   Tobacco Use   Smoking status: Former   Smokeless tobacco: Never  Building services engineer Use: Never used  Substance Use Topics   Alcohol use: Yes    Comment: socially   Drug use: No    ROS   Objective:   Vitals: BP 114/76 (BP Location: Left Arm)   Pulse 83   Temp 98.5 F (36.9 C) (Oral)   Resp 16   SpO2 98%   Physical Exam Constitutional:      General: She  is not in acute distress.    Appearance: Normal appearance. She is well-developed. She is not ill-appearing, toxic-appearing or diaphoretic.  HENT:     Head: Normocephalic and atraumatic.     Nose: Nose normal.     Mouth/Throat:     Mouth: Mucous membranes are moist.  Eyes:     General: No scleral icterus.       Right eye: No discharge.        Left eye: No discharge.     Extraocular Movements: Extraocular movements intact.  Cardiovascular:     Rate and Rhythm: Normal rate and regular rhythm.     Heart sounds: Normal heart sounds. No murmur heard.    No friction rub. No gallop.  Pulmonary:     Effort: Pulmonary effort is normal. No respiratory distress.     Breath sounds: No stridor. No wheezing, rhonchi or rales.  Chest:     Chest wall: No tenderness.  Skin:    General: Skin is warm and dry.  Neurological:     General: No focal deficit present.     Mental Status: She is alert and oriented to person, place, and time.  Psychiatric:        Mood and Affect: Mood normal.  Behavior: Behavior normal.     DG Chest 2 View  Result Date: 01/19/2022 CLINICAL DATA:  41 year old female with nonproductive cough for 1 month. Shortness of breath. Pain. EXAM: CHEST - 2 VIEW COMPARISON:  None Available. FINDINGS: PA and lateral views at 1049 hours. Normal lung volumes and mediastinal contours. Visualized tracheal air column is within normal limits. Both lungs appear clear. No pneumothorax or pleural effusion. Mild dextroconvex scoliosis and straightening of thoracic kyphosis. No acute osseous abnormality identified. Negative visible bowel gas. IMPRESSION: No cardiopulmonary abnormality.  Scoliosis. Electronically Signed   By: Genevie Ann M.D.   On: 01/19/2022 10:56     Assessment and Plan :   PDMP not reviewed this encounter.  1. Persistent cough   2. Upper back pain   3. Other idiopathic scoliosis, thoracic region     Given the duration and worsening nature of her cough recommended a  oral prednisone course.  I suspect that this will help her in addition with the back pains which may have been elicited from the cough and worsened by her scoliosis.  No other suspicion for an acute cardiopulmonary event, pulmonary embolism, patient has a low PERC score. Counseled patient on potential for adverse effects with medications prescribed/recommended today, ER and return-to-clinic precautions discussed, patient verbalized understanding.    Jaynee Eagles, PA-C 01/19/22 1304

## 2022-01-20 ENCOUNTER — Ambulatory Visit: Payer: Medicaid Other | Admitting: Obstetrics & Gynecology

## 2022-01-20 ENCOUNTER — Encounter: Payer: Self-pay | Admitting: Obstetrics & Gynecology

## 2022-01-20 DIAGNOSIS — R9230 Dense breasts, unspecified: Secondary | ICD-10-CM

## 2022-01-20 DIAGNOSIS — R922 Inconclusive mammogram: Secondary | ICD-10-CM

## 2022-01-20 DIAGNOSIS — Z803 Family history of malignant neoplasm of breast: Secondary | ICD-10-CM | POA: Diagnosis not present

## 2022-01-20 DIAGNOSIS — M4180 Other forms of scoliosis, site unspecified: Secondary | ICD-10-CM

## 2022-01-20 DIAGNOSIS — L700 Acne vulgaris: Secondary | ICD-10-CM

## 2022-01-20 NOTE — Progress Notes (Signed)
    [name] Sep 30, 1980 [id number]        41 y.o.  G1P1L1  Accompanied by her husband  RP: Mid back pain/coughing/Dextroscoliosis/New onset of Acne  HPI: Presented to [facility] for a cough with mid back pain.  CXR showed Scoliosis that patient was not aware of.  C/O new onset facial acne.  No hirsutism.  Menses normal every month.  Well on Paragard IUD x 12/2013.  No pelvic pain.  Normal secretions. No h/o abnormal Pap.  Pap reflex Neg 08/2021. No gyn issues today.   Breast very dense category D.  The patient has strong family history of breast cancer in her mother who was diagnosed at age 46 and her maternal grandmother who was diagnosed at age 81, and both passed away in their 66s from the disease. She has had blood test for gene mutations, which was negative.  Annual breast MRI recommended by radiology.   OB History  Gravida Para Term Preterm AB Living  1 1       1   SAB IAB Ectopic Multiple Live Births               # Outcome Date GA Lbr Len/2nd Weight Sex Delivery Anes PTL Lv  1 Para             Past medical history,surgical history, problem list, medications, allergies, family history and social history were all reviewed and documented in the EPIC chart.   Directed ROS with pertinent positives and negatives documented in the history of present illness/assessment and plan.  Exam:  There were no vitals filed for this visit. General appearance:  Normal  Recent CXR not found in the Epic chart.  Chest X-Ray 06/2018 Frontal chest as well as oblique and cone-down rib images were  obtained. Lungs are clear. Heart size and pulmonary vascularity are  normal. No adenopathy. There is no pneumothorax or pleural effusion.  No evident rib fracture. There is mild lower thoracic  dextroscoliosis.    Gynecologic exam: Deferred   Assessment/Plan:  41 y.o. G1P1   1. Dextroscoliosis Presented to [facility] for a cough with mid back pain.  CXR showed Mild Dextro Scoliosis  that patient was not aware of. Refer to Orthopedist to further investigate a mild Dextroscoliosis and receive counseling.  2. Family history of breast cancer in female Breast very dense category D.  The patient has strong family history of breast cancer in her mother who was diagnosed at age 49 and her maternal grandmother who was diagnosed at age 52, and both passed away in their 3s from the disease. She has had blood test for gene mutations, which was negative. Annual Breast MRI recommended by Radiology.  Order MRI of Breasts.  3. Dense breasts As above.  4. Acne vulgaris  New onset of facial acne.  No hirsutism.  Refer to [facility] for new onset acne.  Recommend establishing with a Family MD.   Princess Bruins MD, 4:26 PM 01/20/2022

## 2022-01-21 ENCOUNTER — Telehealth: Payer: Self-pay

## 2022-01-21 ENCOUNTER — Encounter: Payer: Self-pay | Admitting: Obstetrics & Gynecology

## 2022-01-21 DIAGNOSIS — R9230 Dense breasts, unspecified: Secondary | ICD-10-CM

## 2022-01-21 DIAGNOSIS — L708 Other acne: Secondary | ICD-10-CM

## 2022-01-21 DIAGNOSIS — R922 Inconclusive mammogram: Secondary | ICD-10-CM

## 2022-01-21 DIAGNOSIS — M4180 Other forms of scoliosis, site unspecified: Secondary | ICD-10-CM

## 2022-01-21 DIAGNOSIS — Z803 Family history of malignant neoplasm of breast: Secondary | ICD-10-CM

## 2022-01-21 NOTE — Telephone Encounter (Addendum)
Order placed in Epic for Bilateral Breast MRI with [facility].  Bilateral Breast MRI scheduled with [facility] for 03/08/22 at 10:20am.

## 2022-01-21 NOTE — Telephone Encounter (Addendum)
Referral placed in Epic for Orthopedic referral.  Appt scheduled for 01/22/22 at 10:30am.

## 2022-01-21 NOTE — Telephone Encounter (Addendum)
Referral order placed in Epic for dermatology.  Appt scheduled for 11/10/2022 10:30am.

## 2022-01-21 NOTE — Telephone Encounter (Signed)
Scheduled for 03/08/2022. Order placed in telephone encounter from 01/21/22. Will close this encounter.

## 2022-01-21 NOTE — Telephone Encounter (Signed)
Refer to [facility] and order an MRI of the breasts Received: [name], [name] Triage Refer to Orthopedist to further investigate a mild Dextroscoliosis and receive counseling.   Refer to [facility] for new onset acne.   Order MRI of Breasts:  Breast very dense category D.  The patient has strong family  history of breast cancer in her mother who was diagnosed at age 39  and her maternal grandmother who was diagnosed at age 49, and both  passed away in their 82s from the disease. She has had blood test  for gene mutations, which was negative.

## 2022-01-22 ENCOUNTER — Ambulatory Visit (INDEPENDENT_AMBULATORY_CARE_PROVIDER_SITE_OTHER): Payer: Medicaid Other | Admitting: Surgery

## 2022-01-22 ENCOUNTER — Ambulatory Visit: Payer: Self-pay

## 2022-01-22 ENCOUNTER — Encounter: Payer: Self-pay | Admitting: Surgery

## 2022-01-22 VITALS — BP 115/79 | HR 87 | Ht 62.0 in | Wt 168.0 lb

## 2022-01-22 DIAGNOSIS — M4125 Other idiopathic scoliosis, thoracolumbar region: Secondary | ICD-10-CM | POA: Diagnosis not present

## 2022-01-22 DIAGNOSIS — G8929 Other chronic pain: Secondary | ICD-10-CM

## 2022-01-22 DIAGNOSIS — M549 Dorsalgia, unspecified: Secondary | ICD-10-CM

## 2022-01-22 DIAGNOSIS — M2550 Pain in unspecified joint: Secondary | ICD-10-CM | POA: Diagnosis not present

## 2022-01-22 DIAGNOSIS — M533 Sacrococcygeal disorders, not elsewhere classified: Secondary | ICD-10-CM | POA: Diagnosis not present

## 2022-01-22 NOTE — Progress Notes (Signed)
Office Visit Note   Patient: [name]           Date of Birth: 15-Jul-1980           MRN: [id number] Visit Date: 01/22/2022              Requested by: Princess Bruins, [address] [place],  [place] [address] PCP: Pcp, No   Assessment & Plan: Visit Diagnoses:  1. Mid back pain   2. Other idiopathic scoliosis, thoracolumbar region   3. Chronic left SI joint pain   4. Chronic right SI joint pain   5. Polyarthralgia     Plan: For polyarthralgia blood work was drawn to check a CBC and arthritis panel.  We will send patient to formal PT for some back exercises and they will also show her proper body mechanics when she is lifting for her job.  Follow-up in 3 weeks for recheck and I will review labs at that time.  Will decide if further imaging studies are indicated.  Follow-Up Instructions: Return in about 3 weeks (around 02/12/2022) for with [facility] recheck lumbar and review labs.   Orders:  Orders Placed This Encounter  Procedures   XR SCOLIOSIS EVAL COMPLETE SPINE 2 OR 3 VIEWS   CBC   Antinuclear Antib (ANA)   Rheumatoid Factor   Uric acid   Sed Rate (ESR)   C-reactive protein   Anti-nuclear ab-titer (ANA titer)   Ambulatory referral to Physical Therapy   No orders of the defined types were placed in this encounter.     Procedures: No procedures performed   Clinical Data: No additional findings.   Subjective: Chief Complaint  Patient presents with   scoliosis    HPI 41 year old patient comes in with interpreter.  She is being evaluated for dextroscoliosis.  Patient was seen in the ED January 19, 2022 for persistent cough and had a chest x-ray which showed mild dextroconvex scoliosis and straightening of thoracic kyphosis.  Physician eventually referred her to our office for evaluation of the scoliosis.  When patient was seen in the ED she was complained of upper back pain which was thought to be from the increased cough.  States that her pain  is better after taking steroid taper.  Patient states that she is always had back pain.  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  She admits to not having proper body mechanics.  Not describing any lower extremity radiculopathy.  She has had pain at times in her right arm.  Some pain around the bilateral SI joints as well. Review of Systems No current complaints of cardiopulmonary GI/GU issues  Objective: Vital Signs: BP 115/79   Pulse 87   Ht 5' 2"  (1.575 m)   Wt 168 lb (76.2 kg)   BMI 30.73 kg/m   Physical Exam HENT:     Head: Normocephalic and atraumatic.     Nose: Nose normal.  Eyes:     Extraocular Movements: Extraocular movements intact.  Pulmonary:     Effort: No respiratory distress.  Musculoskeletal:     Comments: Gait is normal.  No lumbar paraspinal tenderness.  She has mild to moderate tenderness of the bilateral SI joints.  Negative log about hips.  Negative straight leg raise.  No focal motor deficits.  Neurological:     Mental Status: She is alert and oriented to person, place, and time.  Psychiatric:  Mood and Affect: Mood normal.     Ortho Exam  Specialty Comments:  No specialty comments available.  Imaging: No results found.   PMFS History: Patient Active Problem List   Diagnosis Date Noted   Dysmenorrhea 03/13/2014   Menorrhagia with regular cycle 03/13/2014   IUD (intrauterine device) in place 12/28/2013   Family history of breast cancer in female 07/14/2013   H. pylori infection 06/23/2013   GERD (gastroesophageal reflux disease) 06/22/2013   Visit for preventive health examination 06/22/2013   ANEMIA, MILD 07/24/2008   History reviewed. No pertinent past medical history.  Family History  Problem Relation Age of Onset   Breast cancer Mother 79       deceased 2   Cancer Father        prostate   Hypertension Father    Breast cancer Maternal Grandmother 37       deceased 60s    Past  Surgical History:  Procedure Laterality Date   CESAREAN SECTION     Social History   Occupational History   Not on file  Tobacco Use   Smoking status: Former   Smokeless tobacco: Never  Scientific laboratory technician Use: Never used  Substance and Sexual Activity   Alcohol use: Yes    Comment: socially   Drug use: No   Sexual activity: Yes    Partners: Male    Birth control/protection: I.U.D.    Comment: 1si intercourse-21, partners- 5+, married- 61 yrs-Paragard inserted 2015

## 2022-01-24 LAB — ANTI-NUCLEAR AB-TITER (ANA TITER): ANA Titer 1: 1:160 {titer} — ABNORMAL HIGH

## 2022-01-24 LAB — SEDIMENTATION RATE: Sed Rate: 2 mm/h (ref 0–20)

## 2022-01-24 LAB — CBC
HCT: 41.1 % (ref 35.0–45.0)
Hemoglobin: 14.1 g/dL (ref 11.7–15.5)
MCH: 31.8 pg (ref 27.0–33.0)
MCHC: 34.3 g/dL (ref 32.0–36.0)
MCV: 92.8 fL (ref 80.0–100.0)
MPV: 11.3 fL (ref 7.5–12.5)
Platelets: 321 10*3/uL (ref 140–400)
RBC: 4.43 10*6/uL (ref 3.80–5.10)
RDW: 12.4 % (ref 11.0–15.0)
WBC: 10.2 10*3/uL (ref 3.8–10.8)

## 2022-01-24 LAB — ANA: Anti Nuclear Antibody (ANA): POSITIVE — AB

## 2022-01-24 LAB — RHEUMATOID FACTOR: Rheumatoid fact SerPl-aCnc: <14 IU/mL (ref <14)

## 2022-01-24 LAB — URIC ACID: Uric Acid, Serum: 3 mg/dL (ref 2.5–7.0)

## 2022-01-24 LAB — C-REACTIVE PROTEIN: CRP: 0.5 mg/L (ref <8.0)

## 2022-01-31 ENCOUNTER — Ambulatory Visit: Payer: Medicaid Other | Attending: Obstetrics & Gynecology

## 2022-01-31 ENCOUNTER — Ambulatory Visit: Payer: Medicaid Other

## 2022-01-31 ENCOUNTER — Other Ambulatory Visit: Payer: Self-pay

## 2022-01-31 DIAGNOSIS — M533 Sacrococcygeal disorders, not elsewhere classified: Secondary | ICD-10-CM | POA: Insufficient documentation

## 2022-01-31 DIAGNOSIS — M549 Dorsalgia, unspecified: Secondary | ICD-10-CM | POA: Diagnosis not present

## 2022-01-31 DIAGNOSIS — M5459 Other low back pain: Secondary | ICD-10-CM | POA: Insufficient documentation

## 2022-01-31 DIAGNOSIS — M4124 Other idiopathic scoliosis, thoracic region: Secondary | ICD-10-CM | POA: Insufficient documentation

## 2022-01-31 DIAGNOSIS — G8929 Other chronic pain: Secondary | ICD-10-CM | POA: Diagnosis not present

## 2022-01-31 DIAGNOSIS — M6281 Muscle weakness (generalized): Secondary | ICD-10-CM | POA: Diagnosis not present

## 2022-01-31 DIAGNOSIS — R293 Abnormal posture: Secondary | ICD-10-CM | POA: Diagnosis not present

## 2022-01-31 DIAGNOSIS — M4125 Other idiopathic scoliosis, thoracolumbar region: Secondary | ICD-10-CM | POA: Insufficient documentation

## 2022-01-31 NOTE — Therapy (Signed)
OUTPATIENT PHYSICAL THERAPY THORACOLUMBAR EVALUATION   Patient Name: [name] MRN: [id number] DOB:1980-11-24, 41 y.o., female Today's Date: 01/31/2022   PT End of Session - 01/31/22 1511     Visit Number 1    Number of Visits 16    Date for PT Re-Evaluation 04/06/22    Authorization Type [employer]    PT Start Time 1245    PT Stop Time 1340    PT Time Calculation (min) 55 min    Activity Tolerance Patient tolerated treatment well    Behavior During Therapy [facility] for tasks assessed/performed             History reviewed. No pertinent past medical history. Past Surgical History:  Procedure Laterality Date   CESAREAN SECTION     Patient Active Problem List   Diagnosis Date Noted   Dysmenorrhea 03/13/2014   Menorrhagia with regular cycle 03/13/2014   IUD (intrauterine device) in place 12/28/2013   Family history of breast cancer in female 07/14/2013   H. pylori infection 06/23/2013   GERD (gastroesophageal reflux disease) 06/22/2013   Visit for preventive health examination 06/22/2013   ANEMIA, MILD 07/24/2008    PCP: None  REFERRING PROVIDER: Lanae Crumbly, PA-C  REFERRING DIAG:  M54.9 (ICD-10-CM) - Mid back pain  M41.25 (ICD-10-CM) - Other idiopathic scoliosis, thoracolumbar region  M53.3,G89.29 (ICD-10-CM) - Chronic left SI joint pain  M53.3,G89.29 (ICD-10-CM) - Chronic right SI joint pain    Rationale for Evaluation and Treatment: Rehabilitation  THERAPY DIAG:  Other idiopathic scoliosis, thoracic region  Other low back pain  Muscle weakness (generalized)  Abnormal posture  ONSET DATE: last few months  SUBJECTIVE:                                                                                                                                                                                           SUBJECTIVE STATEMENT: Pt reports she is in PT today because she was diagnosed with scoliosis and she has LBP, so her doctor referred her here to see  what she can do to alter daily routine to not "mess up her back". She wants to use her machines at home, I.e. her Bowflex and cardio. Her doctor deferred her questions to PT. 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 She wants to work on Midwife and to know exercises she can do at home to make sure it doesn't get worse. She hasn't changed her activity and this pain has come on via insidious onset. She felt a sharp pain from her hip to her upper back and went to the ED  because it wasn't going away.   PERTINENT HISTORY:  c-section  PAIN:  Are you having pain? Yes: NPRS scale: 3/10 Pain location: left lower back and sometimes left upper back Pain description: constricting, aching Aggravating factors: unknown Relieving factors: unknown  PRECAUTIONS: None  WEIGHT BEARING RESTRICTIONS: No  FALLS:  Has patient fallen in last 6 months? No  LIVING ENVIRONMENT: Lives with: lives with their family Lives in: House/apartment Stairs: No Has following equipment at home: None  OCCUPATION: Orthoptist  PLOF: Independent  PATIENT GOALS: She wants to figure out what she can do to make it better and prevent it from getting worse, managing any pain   OBJECTIVE:   DIAGNOSTIC FINDINGS:  XR: mild left thoracic scoliosis  PATIENT SURVEYS:  Modified Oswestry 2/50   SCREENING FOR RED FLAGS: Bowel or bladder incontinence: No Spinal tumors: No Cauda equina syndrome: No Compression fracture: No Abdominal aneurysm: No  COGNITION: Overall cognitive status: Within functional limits for tasks assessed     SENSATION: WFL  MUSCLE LENGTH: Hamstrings: Right nt deg; Left nt deg Thomas test: Right nt deg; Left nt deg  POSTURE:  R upper thoracic mild scoliosis, R hip hump with forward bend  PALPATION: TTP B PSIS, L thoracic and lumbar paraspinals   LUMBAR ROM:   AROM eval  Flexion WFL  Extension WFL (needed to instruct to not hinge at  thoracolumbar junction)  Right lateral flexion Fingertips past R patella  Left lateral flexion Fingertips to L joint line  Right rotation WFL  Left rotation 25% decrease   (Blank rows = not tested)  LOWER EXTREMITY ROM:     Passive  Right eval Left eval  Hip flexion [facility]  Hip extension    Hip abduction    Hip adduction    Hip internal rotation [facility]  Hip external rotation [facility]  Knee flexion    Knee extension    Ankle dorsiflexion    Ankle plantarflexion    Ankle inversion    Ankle eversion     (Blank rows = not tested)  LOWER EXTREMITY MMT:    MMT Right eval Left eval  Hip flexion 4/5 4/5  Hip extension 4-/5 4/5  Hip abduction 4-/5 4/5  Hip adduction    Hip internal rotation    Hip external rotation 4-/5 4/5  Knee flexion    Knee extension    Ankle dorsiflexion    Ankle plantarflexion    Ankle inversion    Ankle eversion     (Blank rows = not tested)  LUMBAR SPECIAL TESTS:  NT  FUNCTIONAL TESTS:   Squat: R knee valgus, excessive anterior knee translation B, rounded spine with limited hip hinge --> instructed pt in hip hinge for deadlift with dowel along spine, cuing knee/ankle/hip alignment with red theraband tied above knees; additionally instructed on lifting/lowering mechanics with 10# DB from floor  SLS: initially hanging on joints --> normal after instruction  GAIT: Distance walked: 100 ft x 2 Assistive device utilized: None Level of assistance: Complete Independence Comments: unremarkable  TODAY'S TREATMENT:  DATE: 01/31/2022    PATIENT EDUCATION:  Education details: scoliosis, diagnosis, prognosis, posture, lifting mechanics, deadlift HEP, POC Person educated: Patient and Child(ren) Education method: Explanation, Demonstration, Tactile cues, Verbal cues, and Handouts Education comprehension: verbalized  understanding, returned demonstration, verbal cues required, tactile cues required, and needs further education  HOME EXERCISE PROGRAM: Access Code: [email or web address] URL: [email or web address] Date: 01/31/2022 Prepared by: Gardiner Rhyme  Exercises - Squat with Chair Touch and Resistance Loop  - 1-2 x daily - 7 x weekly - 2 sets - 10-15 reps - Sidelying Open Book Thoracic Lumbar Rotation and Extension  - 2 x daily - 7 x weekly - 1-2 sets - 10 reps - Standing Lumbar Extension  - 2 x daily - 7 x weekly - 1 sets - 10 reps - Supine Figure 4 Piriformis Stretch  - 2 x daily - 7 x weekly - 2 sets - 20 seconds hold - Supine Piriformis Stretch with Leg Straight  - 2 x daily - 7 x weekly - 2 sets - 20 seconds hold  ASSESSMENT:  CLINICAL IMPRESSION: Patient is a 41 y.o. female who was seen today for physical therapy evaluation and treatment for left-sided back pain and newfound scoliosis diagnosis. Pt c/o tension throughout left side of back that sometimes feels like a constriction/spasm. Pt's son, Romeo Apple, assisted in interpreting throughout evaluation. Pt demonstrates R upper thoracic scoliosis with right rib hump and palpable increase in muscle/soft tissue tension along right side of spine. She additionally demonstrates impairments in squatting and lifting mechanics, as well as SLS prior to instruction from PT. She was educated on scoliosis, diagnosis, prognosis, posture, lifting mechanics, deadlift HEP, POC. She verbalized understanding and consent to treatment. Pt would benefit from skilled PT 1-2x/week for 6-8 weeks to address impairments.   OBJECTIVE IMPAIRMENTS: decreased strength, hypomobility, increased fascial restrictions, impaired perceived functional ability, increased muscle spasms, impaired flexibility, improper body mechanics, postural dysfunction, and pain.   ACTIVITY LIMITATIONS: carrying, lifting, bending, and squatting  PARTICIPATION LIMITATIONS: cleaning, community activity, and  occupation  PERSONAL FACTORS: Past/current experiences and Profession are also affecting patient's functional outcome.   REHAB POTENTIAL: Good  CLINICAL DECISION MAKING: Stable/uncomplicated  EVALUATION COMPLEXITY: Low   GOALS: Goals reviewed with patient? No  SHORT TERM GOALS: Target date: 02/22/2022  Pt will be I and compliant with initial HEP. Baseline: provided at eval Goal status: INITIAL  LONG TERM GOALS: Target date: 04/06/22  Pt will be independent with advanced HEP. Baseline: not provided  Goal status: INITIAL  2.  Pt will increase hip MMT to 4+/5 B. Baseline: see chart above Goal status: INITIAL  3.  Pt will demonstrate squat with proper hip hinge and hip/knee/ankle alignment. Baseline: R knee valgus, excessive anterior knee translation B, rounded spine with limited hip hinge Goal status: INITIAL  4.  Pt will demonstrate proper lifting mechanics of 25-50 lbs from floor <> waist without back pain. Baseline: poor mechanics Goal status: INITIAL  5.  Pt will report no more 3/10 with work duties. Baseline:  Goal status: INITIAL    PLAN:  PT FREQUENCY: 1-2x/week  PT DURATION: 8 weeks  PLANNED INTERVENTIONS: Therapeutic exercises, Therapeutic activity, Neuromuscular re-education, Balance training, Gait training, Patient/Family education, Self Care, Joint mobilization, Dry Needling, Spinal manipulation, Spinal mobilization, Cryotherapy, Moist heat, Taping, Manual therapy, and Re-evaluation.  PLAN FOR NEXT SESSION: assess response to HEP/update PRN, R hip flexibility, body mechanics, hip strength, spinal mobility   Marcelline Mates, PT, DPT 01/31/2022, 3:16 PM  Check all possible CPT  codes: [id number] - PT Re-evaluation, [id number]- Therapeutic Exercise, [phone]- Neuro Re-education, [phone] - Gait Training, [phone] - Manual Therapy, [phone] - Therapeutic Activities, and [phone] - Self Care    Check all conditions that are expected to impact treatment: Unknown   If treatment  provided at initial evaluation, no treatment charged due to lack of authorization.

## 2022-02-07 ENCOUNTER — Ambulatory Visit: Payer: Medicaid Other | Attending: Obstetrics & Gynecology

## 2022-02-07 DIAGNOSIS — M5459 Other low back pain: Secondary | ICD-10-CM | POA: Diagnosis not present

## 2022-02-07 DIAGNOSIS — M6281 Muscle weakness (generalized): Secondary | ICD-10-CM | POA: Insufficient documentation

## 2022-02-07 DIAGNOSIS — M4124 Other idiopathic scoliosis, thoracic region: Secondary | ICD-10-CM | POA: Diagnosis not present

## 2022-02-07 DIAGNOSIS — R293 Abnormal posture: Secondary | ICD-10-CM | POA: Diagnosis not present

## 2022-02-07 NOTE — Therapy (Signed)
OUTPATIENT PHYSICAL THERAPY TREATMENT NOTE   Patient Name: [name] MRN: [id number] DOB:1980/05/15, 41 y.o., female Today's Date: 02/07/2022  PCP: none  REFERRING PROVIDER: Naida Sleight, PA-C   END OF SESSION:   PT End of Session - 02/07/22 1223     Visit Number 2    Number of Visits 16    Date for PT Re-Evaluation 04/06/22    Authorization Type [employer]    Authorization Time Period 10/27-11/28/23    Authorization - Visit Number 1    Authorization - Number of Visits 4    PT Start Time 1223    PT Stop Time 1303    PT Time Calculation (min) 40 min    Activity Tolerance Patient tolerated treatment well    Behavior During Therapy [facility] for tasks assessed/performed             History reviewed. No pertinent past medical history. Past Surgical History:  Procedure Laterality Date   CESAREAN SECTION     Patient Active Problem List   Diagnosis Date Noted   Dysmenorrhea 03/13/2014   Menorrhagia with regular cycle 03/13/2014   IUD (intrauterine device) in place 12/28/2013   Family history of breast cancer in female 07/14/2013   H. pylori infection 06/23/2013   GERD (gastroesophageal reflux disease) 06/22/2013   Visit for preventive health examination 06/22/2013   ANEMIA, MILD 07/24/2008    REFERRING DIAG:  M54.9 (ICD-10-CM) - Mid back pain  M41.25 (ICD-10-CM) - Other idiopathic scoliosis, thoracolumbar region  M53.3,G89.29 (ICD-10-CM) - Chronic left SI joint pain  M53.3,G89.29 (ICD-10-CM) - Chronic right SI joint pain    THERAPY DIAG:  Other idiopathic scoliosis, thoracic region  Other low back pain  Muscle weakness (generalized)  Abnormal posture  Rationale for Evaluation and Treatment Rehabilitation  PERTINENT HISTORY: C-section   PRECAUTIONS: none  SUBJECTIVE:                                                                                                                                                                                     Son  interprets as needed.  SUBJECTIVE STATEMENT:  Patient reports her back is feeling the same and her exercises are going ok.    PAIN:  Are you having pain? Yes: NPRS scale: 2/10 Pain location: Lt mid/lower back Pain description: pressure; stabbing Aggravating factors: unknown Relieving factors: rest    OBJECTIVE: (objective measures completed at initial evaluation unless otherwise dated) OBJECTIVE:    DIAGNOSTIC FINDINGS:  XR: mild left thoracic scoliosis   PATIENT SURVEYS:  Modified Oswestry 2/50    SCREENING FOR RED FLAGS: Bowel or bladder incontinence: No Spinal tumors: No Cauda equina syndrome: No  Compression fracture: No Abdominal aneurysm: No   COGNITION: Overall cognitive status: Within functional limits for tasks assessed                          SENSATION: WFL   MUSCLE LENGTH: Hamstrings: Right nt deg; Left nt deg Thomas test: Right nt deg; Left nt deg   POSTURE:  R upper thoracic mild scoliosis, R hip hump with forward bend   PALPATION: TTP B PSIS, L thoracic and lumbar paraspinals     LUMBAR ROM:    AROM eval  Flexion WFL  Extension WFL (needed to instruct to not hinge at thoracolumbar junction)  Right lateral flexion Fingertips past R patella  Left lateral flexion Fingertips to L joint line  Right rotation WFL  Left rotation 25% decrease   (Blank rows = not tested)   LOWER EXTREMITY ROM:      Passive  Right eval Left eval  Hip flexion [facility]  Hip extension      Hip abduction      Hip adduction      Hip internal rotation [facility]  Hip external rotation [facility]  Knee flexion      Knee extension      Ankle dorsiflexion      Ankle plantarflexion      Ankle inversion      Ankle eversion       (Blank rows = not tested)   LOWER EXTREMITY MMT:     MMT Right eval Left eval  Hip flexion 4/5 4/5  Hip extension 4-/5 4/5  Hip abduction 4-/5 4/5  Hip adduction      Hip internal rotation      Hip external rotation 4-/5 4/5  Knee flexion       Knee extension      Ankle dorsiflexion      Ankle plantarflexion      Ankle inversion      Ankle eversion       (Blank rows = not tested)   LUMBAR SPECIAL TESTS:  NT   FUNCTIONAL TESTS:    Squat: R knee valgus, excessive anterior knee translation B, rounded spine with limited hip hinge --> instructed pt in hip hinge for deadlift with dowel along spine, cuing knee/ankle/hip alignment with red theraband tied above knees; additionally instructed on lifting/lowering mechanics with 10# DB from floor  SLS: initially hanging on joints --> normal after instruction   GAIT: Distance walked: 100 ft x 2 Assistive device utilized: None Level of assistance: Complete Independence Comments: unremarkable   [facility] Adult PT Treatment:                                                DATE: 02/07/22 Therapeutic Exercise: LTR x 1 minute  Cat/cow x 10  Sidelying thoracic rotation x 10 each  Supine pelvic tilts 2 x 10  Hip bridge with posterior pelvic tilt 2 x 10  SLR with posterior pelvic tilt 2 x 10  Supine TA march 2 x 10  Sidelying hip abduction 2 x 10  Modified sideplank 2 x 20 sec each Updated HEP       PATIENT EDUCATION:  Education details: HEP Person educated: Patient and Child(ren) Education method: Explanation, Demonstration, Tactile cues, Verbal cues, handout  Education comprehension: verbalized understanding, returned demonstration, verbal cues required,  tactile cues required, and needs further education   HOME EXERCISE PROGRAM: Access Code: [email or web address] URL: [email or web address] Date: 01/31/2022 Prepared by: [place] with Chair Touch and Resistance Loop  - 1-2 x daily - 7 x weekly - 2 sets - 10-15 reps - Sidelying Open Book Thoracic Lumbar Rotation and Extension  - 2 x daily - 7 x weekly - 1-2 sets - 10 reps - Standing Lumbar Extension  - 2 x daily - 7 x weekly - 1 sets - 10 reps - Supine Figure 4 Piriformis Stretch  - 2 x daily - 7 x weekly -  2 sets - 20 seconds hold - Supine Piriformis Stretch with Leg Straight  - 2 x daily - 7 x weekly - 2 sets - 20 seconds hold   ASSESSMENT:   CLINICAL IMPRESSION: Patient tolerated session well today focusing on spinal mobility and lumbopelvic strengthening. She has initial difficulty performing posterior pelvic tilt requiring heavy cues for proper engagement. Visible shaking in musculature with majority of hip strengthening, but overall good tolerance without an increase in back pain reported. HEP updated to include further core/hip strengthening.     OBJECTIVE IMPAIRMENTS: decreased strength, hypomobility, increased fascial restrictions, impaired perceived functional ability, increased muscle spasms, impaired flexibility, improper body mechanics, postural dysfunction, and pain.    ACTIVITY LIMITATIONS: carrying, lifting, bending, and squatting   PARTICIPATION LIMITATIONS: cleaning, community activity, and occupation   PERSONAL FACTORS: Past/current experiences and Profession are also affecting patient's functional outcome.    REHAB POTENTIAL: Good   CLINICAL DECISION MAKING: Stable/uncomplicated   EVALUATION COMPLEXITY: Low     GOALS: Goals reviewed with patient? No   SHORT TERM GOALS: Target date: 02/22/2022   Pt will be I and compliant with initial HEP. Baseline: provided at eval Goal status: INITIAL   LONG TERM GOALS: Target date: 04/06/22   Pt will be independent with advanced HEP. Baseline: not provided  Goal status: INITIAL   2.  Pt will increase hip MMT to 4+/5 B. Baseline: see chart above Goal status: INITIAL   3.  Pt will demonstrate squat with proper hip hinge and hip/knee/ankle alignment. Baseline: R knee valgus, excessive anterior knee translation B, rounded spine with limited hip hinge Goal status: INITIAL   4.  Pt will demonstrate proper lifting mechanics of 25-50 lbs from floor <> waist without back pain. Baseline: poor mechanics Goal status: INITIAL    5.  Pt will report no more 3/10 with work duties. Baseline:  Goal status: INITIAL       PLAN:   PT FREQUENCY: 1-2x/week   PT DURATION: 8 weeks   PLANNED INTERVENTIONS: Therapeutic exercises, Therapeutic activity, Neuromuscular re-education, Balance training, Gait training, Patient/Family education, Self Care, Joint mobilization, Dry Needling, Spinal manipulation, Spinal mobilization, Cryotherapy, Moist heat, Taping, Manual therapy, and Re-evaluation.   PLAN FOR NEXT SESSION: assess response to HEP/update PRN, R hip flexibility, body mechanics, hip strength, spinal mobility    Gwendolyn Grant, PT, DPT, ATC 02/07/22 1:05 PM

## 2022-02-14 ENCOUNTER — Encounter: Payer: Self-pay | Admitting: Surgery

## 2022-02-14 ENCOUNTER — Ambulatory Visit (INDEPENDENT_AMBULATORY_CARE_PROVIDER_SITE_OTHER): Payer: Medicaid Other | Admitting: Surgery

## 2022-02-14 ENCOUNTER — Ambulatory Visit: Payer: Medicaid Other

## 2022-02-14 DIAGNOSIS — M2550 Pain in unspecified joint: Secondary | ICD-10-CM

## 2022-02-14 DIAGNOSIS — G8929 Other chronic pain: Secondary | ICD-10-CM

## 2022-02-14 DIAGNOSIS — M6281 Muscle weakness (generalized): Secondary | ICD-10-CM

## 2022-02-14 DIAGNOSIS — M4124 Other idiopathic scoliosis, thoracic region: Secondary | ICD-10-CM

## 2022-02-14 DIAGNOSIS — R768 Other specified abnormal immunological findings in serum: Secondary | ICD-10-CM | POA: Diagnosis not present

## 2022-02-14 DIAGNOSIS — M533 Sacrococcygeal disorders, not elsewhere classified: Secondary | ICD-10-CM

## 2022-02-14 DIAGNOSIS — R293 Abnormal posture: Secondary | ICD-10-CM | POA: Diagnosis not present

## 2022-02-14 DIAGNOSIS — M5459 Other low back pain: Secondary | ICD-10-CM

## 2022-02-14 NOTE — Progress Notes (Signed)
Office Visit Note   Patient: [name]           Date of Birth: January 05, 1981           MRN: [id number] Visit Date: 02/14/2022              Requested by: No referring provider defined for this encounter. PCP: Pcp, No   Assessment & Plan: Visit Diagnoses:  1. Polyarthralgia   2. Chronic right SI joint pain   3. Chronic left SI joint pain   4. ANA positive     Plan: With patient's positive ANA titer I will give rheumatology consult with Dr. Zenovia Jordan.  Patient will continue PT.  Follow-up me in 6 weeks for recheck psych make sure that patient did have have further work-up if needed by rheumatologist.  All questions answered.  Follow-Up Instructions: Return in about 6 weeks (around 03/28/2022) for WITH Kirstin Kugler FOR RECHECK AFTER RHEUMATOLOGY APPOINTMENT.   Orders:  Orders Placed This Encounter  Procedures   Ambulatory referral to Rheumatology   No orders of the defined types were placed in this encounter.     Procedures: No procedures performed   Clinical Data: No additional findings.   Subjective: Chief Complaint  Patient presents with   Middle Back - Follow-up    HPI 41 year old Hispanic female returns for recheck of low back pain and review labs drawn last office visit.  CBC and arthritis panel positive for:  Component Ref Range & Units 3 wk ago  ANA Titer 1 titer 1:160 High   Comment:                 Reference Range                 <1:40        Negative                 1:40-1:80    Low Antibody Level                 >1:80        Elevated Antibody Level .  ANA Pattern 1  Nuclear, Dense Fine Speckled Abnormal     Patient has had 2 formal PT visits.  Still continues to have some pain around the bilateral SI joints but it sounds like it is tolerable at times.  Some pain into the right buttock. Objective: Vital Signs: There were no vitals taken for this visit.  Physical Exam On exam patient has mild tenderness over the bilateral SI joints. Ortho  Exam  Specialty Comments:  No specialty comments available.  Imaging: No results found.   PMFS History: Patient Active Problem List   Diagnosis Date Noted   Dysmenorrhea 03/13/2014   Menorrhagia with regular cycle 03/13/2014   IUD (intrauterine device) in place 12/28/2013   Family history of breast cancer in female 07/14/2013   H. pylori infection 06/23/2013   GERD (gastroesophageal reflux disease) 06/22/2013   Visit for preventive health examination 06/22/2013   ANEMIA, MILD 07/24/2008   History reviewed. No pertinent past medical history.  Family History  Problem Relation Age of Onset   Breast cancer Mother 20       deceased 23   Cancer Father        prostate   Hypertension Father    Breast cancer Maternal Grandmother 38       deceased 68s    Past Surgical History:  Procedure Laterality Date   CESAREAN SECTION  Social History   Occupational History   Not on file  Tobacco Use   Smoking status: Former   Smokeless tobacco: Never  Building services engineer Use: Never used  Substance and Sexual Activity   Alcohol use: Yes    Comment: socially   Drug use: No   Sexual activity: Yes    Partners: Male    Birth control/protection: I.U.D.    Comment: 1si intercourse-21, partners- 5+, married- 8 yrs-Paragard inserted 2015

## 2022-02-14 NOTE — Therapy (Signed)
OUTPATIENT PHYSICAL THERAPY TREATMENT NOTE   Patient Name: NALY SCHWANZ MRN: [id number] DOB:[birth year], 41 y.o., female Today's Date: 02/14/2022  PCP: none  REFERRING PROVIDER: Naida Sleight, PA-C   END OF SESSION:   PT End of Session - 02/14/22 1357     Visit Number 3    Number of Visits 16    Date for PT Re-Evaluation 04/06/22    Authorization Type [employer]    Authorization Time Period 10/27-11/28/23    Authorization - Visit Number 1    Authorization - Number of Visits 4    PT Start Time 1352    PT Stop Time 1437    PT Time Calculation (min) 45 min    Activity Tolerance Patient tolerated treatment well    Behavior During Therapy [facility] for tasks assessed/performed             History reviewed. No pertinent past medical history. Past Surgical History:  Procedure Laterality Date   CESAREAN SECTION     Patient Active Problem List   Diagnosis Date Noted   Dysmenorrhea 03/13/2014   Menorrhagia with regular cycle 03/13/2014   IUD (intrauterine device) in place 12/28/2013   Family history of breast cancer in female 07/14/2013   H. pylori infection 06/23/2013   GERD (gastroesophageal reflux disease) 06/22/2013   Visit for preventive health examination 06/22/2013   ANEMIA, MILD 07/24/2008    REFERRING DIAG:  M54.9 (ICD-10-CM) - Mid back pain  M41.25 (ICD-10-CM) - Other idiopathic scoliosis, thoracolumbar region  M53.3,G89.29 (ICD-10-CM) - Chronic left SI joint pain  M53.3,G89.29 (ICD-10-CM) - Chronic right SI joint pain    THERAPY DIAG:  Other idiopathic scoliosis, thoracic region  Other low back pain  Muscle weakness (generalized)  Abnormal posture  Rationale for Evaluation and Treatment Rehabilitation  PERTINENT HISTORY: C-section   PRECAUTIONS: none  SUBJECTIVE:                                                                                                                                                                                     Son  interprets as needed.  SUBJECTIVE STATEMENT:  Patient reports she was a little sore after her last visit. She has been having pain across the low back and into her left glute/top of posterior thigh.    PAIN:  Are you having pain? Yes: NPRS scale: 4/10 Pain location: Lt mid/lower back Pain description: pressure; stabbing Aggravating factors: unknown Relieving factors: rest    OBJECTIVE: (objective measures completed at initial evaluation unless otherwise dated) OBJECTIVE:    DIAGNOSTIC FINDINGS:  XR: mild left thoracic scoliosis   PATIENT SURVEYS:  Modified Oswestry 2/50    SCREENING FOR  RED FLAGS: Bowel or bladder incontinence: No Spinal tumors: No Cauda equina syndrome: No Compression fracture: No Abdominal aneurysm: No   COGNITION: Overall cognitive status: Within functional limits for tasks assessed                          SENSATION: WFL   MUSCLE LENGTH: Hamstrings: Right nt deg; Left nt deg Thomas test: Right nt deg; Left nt deg   POSTURE:  R upper thoracic mild scoliosis, R hip hump with forward bend   PALPATION: TTP B PSIS, L thoracic and lumbar paraspinals     LUMBAR ROM:    AROM eval  Flexion WFL  Extension WFL (needed to instruct to not hinge at thoracolumbar junction)  Right lateral flexion Fingertips past R patella  Left lateral flexion Fingertips to L joint line  Right rotation WFL  Left rotation 25% decrease   (Blank rows = not tested)   LOWER EXTREMITY ROM:      Passive  Right eval Left eval  Hip flexion [facility]  Hip extension      Hip abduction      Hip adduction      Hip internal rotation [facility]  Hip external rotation [facility]  Knee flexion      Knee extension      Ankle dorsiflexion      Ankle plantarflexion      Ankle inversion      Ankle eversion       (Blank rows = not tested)   LOWER EXTREMITY MMT:     MMT Right eval Left eval  Hip flexion 4/5 4/5  Hip extension 4-/5 4/5  Hip abduction 4-/5 4/5  Hip adduction       Hip internal rotation      Hip external rotation 4-/5 4/5  Knee flexion      Knee extension      Ankle dorsiflexion      Ankle plantarflexion      Ankle inversion      Ankle eversion       (Blank rows = not tested)   LUMBAR SPECIAL TESTS:  NT   FUNCTIONAL TESTS:    Squat: R knee valgus, excessive anterior knee translation B, rounded spine with limited hip hinge --> instructed pt in hip hinge for deadlift with dowel along spine, cuing knee/ankle/hip alignment with red theraband tied above knees; additionally instructed on lifting/lowering mechanics with 10# DB from floor  SLS: initially hanging on joints --> normal after instruction   GAIT: Distance walked: 100 ft x 2 Assistive device utilized: None Level of assistance: Complete Independence Comments: unremarkable   [facility] Adult PT Treatment:                                                DATE: 02/14/22 Therapeutic Exercise: Hip ADD with ball 10x5" Hip ABD with blue theraband 10x5" Bridge with blue band ABD 10x5"  Side stepping with blue band at ankles 15' -> added to HEP and provided handout Manual Therapy: Positive Gillett's test in SLS for L pelvis upslip --> negative at end of session STM to L glutes, added hip IR/ER for passive piriformis pumping (very tender near sacrum) Sacral mobs into extension and flexion with extension relieving some pain Lower lumbar PA grade 3 Notable L leg length discrepancy in  supine with L leg appearing shorter (not shorter in long sitting) --> L LAD grade 4-5 followed by hip ADD and ABD 10x5" manually   [facility] Adult PT Treatment:                                                DATE: 02/07/22 Therapeutic Exercise: LTR x 1 minute  Cat/cow x 10  Sidelying thoracic rotation x 10 each  Supine pelvic tilts 2 x 10  Hip bridge with posterior pelvic tilt 2 x 10  SLR with posterior pelvic tilt 2 x 10  Supine TA march 2 x 10  Sidelying hip abduction 2 x 10  Modified sideplank 2 x 20 sec each Updated  HEP       PATIENT EDUCATION:  Education details: HEP Person educated: Patient and Child(ren) Education method: Explanation, Demonstration, Tactile cues, Verbal cues, handout  Education comprehension: verbalized understanding, returned demonstration, verbal cues required, tactile cues required, and needs further education   HOME EXERCISE PROGRAM: Access Code: [email or web address] URL: [email or web address] Date: 02/14/2022 Prepared by: Gardiner Rhyme  Exercises - Squat with Chair Touch and Resistance Loop  - 1-2 x daily - 7 x weekly - 2 sets - 10-15 reps - Sidelying Open Book Thoracic Lumbar Rotation and Extension  - 2 x daily - 7 x weekly - 1-2 sets - 10 reps - Standing Lumbar Extension  - 2 x daily - 7 x weekly - 1 sets - 10 reps - Supine Figure 4 Piriformis Stretch  - 2 x daily - 7 x weekly - 2 sets - 20 seconds hold - Supine Piriformis Stretch with Leg Straight  - 2 x daily - 7 x weekly - 2 sets - 20 seconds hold - Supine Pelvic Tilt with Straight Leg Raise  - 1 x daily - 7 x weekly - 2 sets - 10 reps - Sidelying Hip Abduction  - 1 x daily - 7 x weekly - 2 sets - 10 reps - Side Plank on Knees  - 1 x daily - 7 x weekly - 3 sets - 20 sec  hold - Side Stepping with Resistance at Ankles  - 1 x daily - 3 x weekly - 1-2 sets   ASSESSMENT:   CLINICAL IMPRESSION: Patient presents with lower back pain and sciatic symptoms into L glute/upper thigh today. She had a positive Gillett's test in R SLS for L pelvic upslip with subsequent L leg length discrepancy (shorter). Testing was negative and leg length symmetrical post treatment. Pt reported feeling good after session, and tolerated all treatment well. Added band resisted side stepping to home program and educated on correction of long axis distraction for son/husband to help with followed by stability, as needed.     OBJECTIVE IMPAIRMENTS: decreased strength, hypomobility, increased fascial restrictions, impaired perceived functional  ability, increased muscle spasms, impaired flexibility, improper body mechanics, postural dysfunction, and pain.    ACTIVITY LIMITATIONS: carrying, lifting, bending, and squatting   PARTICIPATION LIMITATIONS: cleaning, community activity, and occupation   PERSONAL FACTORS: Past/current experiences and Profession are also affecting patient's functional outcome.    REHAB POTENTIAL: Good   CLINICAL DECISION MAKING: Stable/uncomplicated   EVALUATION COMPLEXITY: Low     GOALS: Goals reviewed with patient? No   SHORT TERM GOALS: Target date: 02/22/2022   Pt will be I and compliant with initial HEP. Baseline: provided at  eval Goal status: INITIAL   LONG TERM GOALS: Target date: 04/06/22   Pt will be independent with advanced HEP. Baseline: not provided  Goal status: INITIAL   2.  Pt will increase hip MMT to 4+/5 B. Baseline: see chart above Goal status: INITIAL   3.  Pt will demonstrate squat with proper hip hinge and hip/knee/ankle alignment. Baseline: R knee valgus, excessive anterior knee translation B, rounded spine with limited hip hinge Goal status: INITIAL   4.  Pt will demonstrate proper lifting mechanics of 25-50 lbs from floor <> waist without back pain. Baseline: poor mechanics Goal status: INITIAL   5.  Pt will report no more 3/10 with work duties. Baseline:  Goal status: INITIAL       PLAN:   PT FREQUENCY: 1-2x/week   PT DURATION: 8 weeks   PLANNED INTERVENTIONS: Therapeutic exercises, Therapeutic activity, Neuromuscular re-education, Balance training, Gait training, Patient/Family education, Self Care, Joint mobilization, Dry Needling, Spinal manipulation, Spinal mobilization, Cryotherapy, Moist heat, Taping, Manual therapy, and Re-evaluation.   PLAN FOR NEXT SESSION: assess response to HEP/update PRN, R hip flexibility, body mechanics, hip strength, spinal mobility    Bettey Mare. Cola Gane, PT, DPT 02/14/22 2:59 PM

## 2022-02-20 NOTE — Therapy (Deleted)
OUTPATIENT PHYSICAL THERAPY TREATMENT NOTE   Patient Name: [name] MRN: [id number] DOB:[birth year], 41 y.o., female Today's Date: 02/20/2022  PCP: none  REFERRING PROVIDER: Naida SleightJames M Owens, PA-C   END OF SESSION:     No past medical history on file. Past Surgical History:  Procedure Laterality Date   CESAREAN SECTION     Patient Active Problem List   Diagnosis Date Noted   Dysmenorrhea 03/13/2014   Menorrhagia with regular cycle 03/13/2014   IUD (intrauterine device) in place 12/28/2013   Family history of breast cancer in female 07/14/2013   H. pylori infection 06/23/2013   GERD (gastroesophageal reflux disease) 06/22/2013   Visit for preventive health examination 06/22/2013   ANEMIA, MILD 07/24/2008    REFERRING DIAG:  M54.9 (ICD-10-CM) - Mid back pain  M41.25 (ICD-10-CM) - Other idiopathic scoliosis, thoracolumbar region  M53.3,G89.29 (ICD-10-CM) - Chronic left SI joint pain  M53.3,G89.29 (ICD-10-CM) - Chronic right SI joint pain    THERAPY DIAG:  No diagnosis found.  Rationale for Evaluation and Treatment Rehabilitation  PERTINENT HISTORY: C-section   PRECAUTIONS: none  SUBJECTIVE:                                                                                                                                                                                     Son interprets as needed.  SUBJECTIVE STATEMENT:  Patient reports she was a little sore after her last visit. She has been having pain across the low back and into her left glute/top of posterior thigh.    PAIN:  Are you having pain? Yes: NPRS scale: 4/10 Pain location: Lt mid/lower back Pain description: pressure; stabbing Aggravating factors: unknown Relieving factors: rest    OBJECTIVE: (objective measures completed at initial evaluation unless otherwise dated) OBJECTIVE:    DIAGNOSTIC FINDINGS:  XR: mild left thoracic scoliosis   PATIENT SURVEYS:  Modified Oswestry 2/50    SCREENING  FOR RED FLAGS: Bowel or bladder incontinence: No Spinal tumors: No Cauda equina syndrome: No Compression fracture: No Abdominal aneurysm: No   COGNITION: Overall cognitive status: Within functional limits for tasks assessed                          SENSATION: WFL   MUSCLE LENGTH: Hamstrings: Right nt deg; Left nt deg Thomas test: Right nt deg; Left nt deg   POSTURE:  R upper thoracic mild scoliosis, R hip hump with forward bend   PALPATION: TTP B PSIS, L thoracic and lumbar paraspinals     LUMBAR ROM:    AROM eval  Flexion WFL  Extension WFL (needed to instruct to not hinge  at thoracolumbar junction)  Right lateral flexion Fingertips past R patella  Left lateral flexion Fingertips to L joint line  Right rotation WFL  Left rotation 25% decrease   (Blank rows = not tested)   LOWER EXTREMITY ROM:      Passive  Right eval Left eval  Hip flexion [facility]  Hip extension      Hip abduction      Hip adduction      Hip internal rotation [facility]  Hip external rotation [facility]  Knee flexion      Knee extension      Ankle dorsiflexion      Ankle plantarflexion      Ankle inversion      Ankle eversion       (Blank rows = not tested)   LOWER EXTREMITY MMT:     MMT Right eval Left eval  Hip flexion 4/5 4/5  Hip extension 4-/5 4/5  Hip abduction 4-/5 4/5  Hip adduction      Hip internal rotation      Hip external rotation 4-/5 4/5  Knee flexion      Knee extension      Ankle dorsiflexion      Ankle plantarflexion      Ankle inversion      Ankle eversion       (Blank rows = not tested)   LUMBAR SPECIAL TESTS:  NT   FUNCTIONAL TESTS:    Squat: R knee valgus, excessive anterior knee translation B, rounded spine with limited hip hinge --> instructed pt in hip hinge for deadlift with dowel along spine, cuing knee/ankle/hip alignment with red theraband tied above knees; additionally instructed on lifting/lowering mechanics with 10# DB from floor  SLS: initially  hanging on joints --> normal after instruction   GAIT: Distance walked: 100 ft x 2 Assistive device utilized: None Level of assistance: Complete Independence Comments: unremarkable   [facility] Adult PT Treatment:                                                DATE: 02/14/22 Therapeutic Exercise: Hip ADD with ball 10x5" Hip ABD with blue theraband 10x5" Bridge with blue band ABD 10x5"  Side stepping with blue band at ankles 15' -> added to HEP and provided handout Manual Therapy: Positive Gillett's test in SLS for L pelvis upslip --> negative at end of session STM to L glutes, added hip IR/ER for passive piriformis pumping (very tender near sacrum) Sacral mobs into extension and flexion with extension relieving some pain Lower lumbar PA grade 3 Notable L leg length discrepancy in supine with L leg appearing shorter (not shorter in long sitting) --> L LAD grade 4-5 followed by hip ADD and ABD 10x5" manually   [facility] Adult PT Treatment:                                                DATE: 02/07/22 Therapeutic Exercise: LTR x 1 minute  Cat/cow x 10  Sidelying thoracic rotation x 10 each  Supine pelvic tilts 2 x 10  Hip bridge with posterior pelvic tilt 2 x 10  SLR with posterior pelvic tilt 2 x 10  Supine TA march 2  x 10  Sidelying hip abduction 2 x 10  Modified sideplank 2 x 20 sec each Updated HEP       PATIENT EDUCATION:  Education details: HEP Person educated: Patient and Child(ren) Education method: Explanation, Demonstration, Tactile cues, Verbal cues, handout  Education comprehension: verbalized understanding, returned demonstration, verbal cues required, tactile cues required, and needs further education   HOME EXERCISE PROGRAM: Access Code: [email or web address] URL: [email or web address] Date: 02/14/2022 Prepared by: Gardiner Rhyme  Exercises - Squat with Chair Touch and Resistance Loop  - 1-2 x daily - 7 x weekly - 2 sets - 10-15 reps - Sidelying Open Book Thoracic  Lumbar Rotation and Extension  - 2 x daily - 7 x weekly - 1-2 sets - 10 reps - Standing Lumbar Extension  - 2 x daily - 7 x weekly - 1 sets - 10 reps - Supine Figure 4 Piriformis Stretch  - 2 x daily - 7 x weekly - 2 sets - 20 seconds hold - Supine Piriformis Stretch with Leg Straight  - 2 x daily - 7 x weekly - 2 sets - 20 seconds hold - Supine Pelvic Tilt with Straight Leg Raise  - 1 x daily - 7 x weekly - 2 sets - 10 reps - Sidelying Hip Abduction  - 1 x daily - 7 x weekly - 2 sets - 10 reps - Side Plank on Knees  - 1 x daily - 7 x weekly - 3 sets - 20 sec  hold - Side Stepping with Resistance at Ankles  - 1 x daily - 3 x weekly - 1-2 sets   ASSESSMENT:   CLINICAL IMPRESSION: Patient presents with lower back pain and sciatic symptoms into L glute/upper thigh today. She had a positive Gillett's test in R SLS for L pelvic upslip with subsequent L leg length discrepancy (shorter). Testing was negative and leg length symmetrical post treatment. Pt reported feeling good after session, and tolerated all treatment well. Added band resisted side stepping to home program and educated on correction of long axis distraction for son/husband to help with followed by stability, as needed.     OBJECTIVE IMPAIRMENTS: decreased strength, hypomobility, increased fascial restrictions, impaired perceived functional ability, increased muscle spasms, impaired flexibility, improper body mechanics, postural dysfunction, and pain.    ACTIVITY LIMITATIONS: carrying, lifting, bending, and squatting   PARTICIPATION LIMITATIONS: cleaning, community activity, and occupation   PERSONAL FACTORS: Past/current experiences and Profession are also affecting patient's functional outcome.    REHAB POTENTIAL: Good   CLINICAL DECISION MAKING: Stable/uncomplicated   EVALUATION COMPLEXITY: Low     GOALS: Goals reviewed with patient? No   SHORT TERM GOALS: Target date: 02/22/2022   Pt will be I and compliant with initial  HEP. Baseline: provided at eval Goal status: INITIAL   LONG TERM GOALS: Target date: 04/06/22   Pt will be independent with advanced HEP. Baseline: not provided  Goal status: INITIAL   2.  Pt will increase hip MMT to 4+/5 B. Baseline: see chart above Goal status: INITIAL   3.  Pt will demonstrate squat with proper hip hinge and hip/knee/ankle alignment. Baseline: R knee valgus, excessive anterior knee translation B, rounded spine with limited hip hinge Goal status: INITIAL   4.  Pt will demonstrate proper lifting mechanics of 25-50 lbs from floor <> waist without back pain. Baseline: poor mechanics Goal status: INITIAL   5.  Pt will report no more 3/10 with work duties. Baseline:  Goal status: INITIAL  PLAN:   PT FREQUENCY: 1-2x/week   PT DURATION: 8 weeks   PLANNED INTERVENTIONS: Therapeutic exercises, Therapeutic activity, Neuromuscular re-education, Balance training, Gait training, Patient/Family education, Self Care, Joint mobilization, Dry Needling, Spinal manipulation, Spinal mobilization, Cryotherapy, Moist heat, Taping, Manual therapy, and Re-evaluation.   PLAN FOR NEXT SESSION: assess response to HEP/update PRN, R hip flexibility, body mechanics, hip strength, spinal mobility    Bettey Mare. Teal, PT, DPT 02/20/22 11:30 AM

## 2022-02-21 ENCOUNTER — Telehealth: Payer: Self-pay | Admitting: Surgery

## 2022-02-21 ENCOUNTER — Ambulatory Visit: Payer: Medicaid Other | Admitting: Physical Therapy

## 2022-02-21 NOTE — Telephone Encounter (Signed)
Can you help? It states that referral is incomplete.

## 2022-02-21 NOTE — Telephone Encounter (Signed)
Pt called in requesting for referral to be sent over to rheumatology... Pt stated that the office didn't get the referral... Pt requesting callback.Marland KitchenMarland Kitchen

## 2022-02-24 ENCOUNTER — Telehealth: Payer: Self-pay

## 2022-02-24 NOTE — Telephone Encounter (Signed)
Referral was sent via [facility] to [facility]- it states it is in review

## 2022-02-24 NOTE — Telephone Encounter (Signed)
Patient husband called said that pt still not heard anything about rheumatology referral. Please call him back at  [phone]

## 2022-02-25 NOTE — Telephone Encounter (Signed)
Spoke with pt husband and he is aware the rheumatology will be calling to scheduled once they review the clinical notes.number was given to pt to call if have not heard in a week/two

## 2022-03-07 ENCOUNTER — Ambulatory Visit: Payer: Medicaid Other | Attending: Obstetrics & Gynecology

## 2022-03-07 DIAGNOSIS — M5459 Other low back pain: Secondary | ICD-10-CM | POA: Diagnosis present

## 2022-03-07 DIAGNOSIS — M4124 Other idiopathic scoliosis, thoracic region: Secondary | ICD-10-CM | POA: Insufficient documentation

## 2022-03-07 DIAGNOSIS — R293 Abnormal posture: Secondary | ICD-10-CM | POA: Diagnosis present

## 2022-03-07 DIAGNOSIS — M6281 Muscle weakness (generalized): Secondary | ICD-10-CM | POA: Insufficient documentation

## 2022-03-07 NOTE — Therapy (Signed)
OUTPATIENT PHYSICAL THERAPY TREATMENT NOTE   Patient Name: [name] MRN: [id number] DOB:May 08, 1980, 41 y.o., female Today's Date: 03/07/2022  PCP: none  REFERRING PROVIDER: Lanae Crumbly, PA-C   END OF SESSION:   PT End of Session - 03/07/22 1358     Visit Number 4    Number of Visits 16    Date for PT Re-Evaluation 04/06/22    Authorization Type [employer]    Authorization Time Period 10/27-11/28/23    Authorization - Number of Visits 4    PT Start Time [id number]    PT Stop Time 1435    PT Time Calculation (min) 40 min    Activity Tolerance Patient tolerated treatment well    Behavior During Therapy [facility] for tasks assessed/performed              History reviewed. No pertinent past medical history. Past Surgical History:  Procedure Laterality Date   CESAREAN SECTION     Patient Active Problem List   Diagnosis Date Noted   Dysmenorrhea 03/13/2014   Menorrhagia with regular cycle 03/13/2014   IUD (intrauterine device) in place 12/28/2013   Family history of breast cancer in female 07/14/2013   H. pylori infection 06/23/2013   GERD (gastroesophageal reflux disease) 06/22/2013   Visit for preventive health examination 06/22/2013   ANEMIA, MILD 07/24/2008    REFERRING DIAG:  M54.9 (ICD-10-CM) - Mid back pain  M41.25 (ICD-10-CM) - Other idiopathic scoliosis, thoracolumbar region  M53.3,G89.29 (ICD-10-CM) - Chronic left SI joint pain  M53.3,G89.29 (ICD-10-CM) - Chronic right SI joint pain    THERAPY DIAG:  Other idiopathic scoliosis, thoracic region  Other low back pain  Muscle weakness (generalized)  Abnormal posture  Rationale for Evaluation and Treatment Rehabilitation  PERTINENT HISTORY: C-section   PRECAUTIONS: none  SUBJECTIVE:                                                                                                                                                                                     Son interprets as needed.  SUBJECTIVE  STATEMENT:  Patient reports she was a little sore after her last visit. She has been having pain across the low back and into her left glute/top of posterior thigh.    PAIN:  Are you having pain? Yes: NPRS scale: 4/10 Pain location: Lt mid/lower back Pain description: pressure; stabbing Aggravating factors: unknown Relieving factors: rest    OBJECTIVE: (objective measures completed at initial evaluation unless otherwise dated) OBJECTIVE:    DIAGNOSTIC FINDINGS:  XR: mild left thoracic scoliosis   PATIENT SURVEYS:  Modified Oswestry 2/50    SCREENING FOR RED FLAGS: Bowel or bladder incontinence: No  Spinal tumors: No Cauda equina syndrome: No Compression fracture: No Abdominal aneurysm: No   COGNITION: Overall cognitive status: Within functional limits for tasks assessed                          SENSATION: WFL   MUSCLE LENGTH: Hamstrings: Right nt deg; Left nt deg Thomas test: Right nt deg; Left nt deg   POSTURE:  R upper thoracic mild scoliosis, R hip hump with forward bend   PALPATION: TTP B PSIS, L thoracic and lumbar paraspinals     LUMBAR ROM:    AROM eval  Flexion WFL  Extension WFL (needed to instruct to not hinge at thoracolumbar junction)  Right lateral flexion Fingertips past R patella  Left lateral flexion Fingertips to L joint line  Right rotation WFL  Left rotation 25% decrease   (Blank rows = not tested)   LOWER EXTREMITY ROM:      Passive  Right eval Left eval  Hip flexion [facility]  Hip extension      Hip abduction      Hip adduction      Hip internal rotation [facility]  Hip external rotation [facility]  Knee flexion      Knee extension      Ankle dorsiflexion      Ankle plantarflexion      Ankle inversion      Ankle eversion       (Blank rows = not tested)   LOWER EXTREMITY MMT:     MMT Right eval Left eval  Hip flexion 4/5 4/5  Hip extension 4-/5 4/5  Hip abduction 4-/5 4/5  Hip adduction      Hip internal rotation      Hip  external rotation 4-/5 4/5  Knee flexion      Knee extension      Ankle dorsiflexion      Ankle plantarflexion      Ankle inversion      Ankle eversion       (Blank rows = not tested)   LUMBAR SPECIAL TESTS:  NT   FUNCTIONAL TESTS:    Squat: R knee valgus, excessive anterior knee translation B, rounded spine with limited hip hinge --> instructed pt in hip hinge for deadlift with dowel along spine, cuing knee/ankle/hip alignment with red theraband tied above knees; additionally instructed on lifting/lowering mechanics with 10# DB from floor  SLS: initially hanging on joints --> normal after instruction   GAIT: Distance walked: 100 ft x 2 Assistive device utilized: None Level of assistance: Complete Independence Comments: unremarkable   [facility] Adult PT Treatment:                                                DATE: 02/14/22 Therapeutic Exercise: Hip ADD with ball 10x5" Hip ABD with blue theraband 10x5" Bridge with blue band ABD 10x5"  Side stepping with blue band at ankles 15' -> added to HEP and provided handout Manual Therapy: Positive Gillett's test in SLS for L pelvis upslip --> negative at end of session STM to L glutes, added hip IR/ER for passive piriformis pumping (very tender near sacrum) Sacral mobs into extension and flexion with extension relieving some pain Lower lumbar PA grade 3 Notable L leg length discrepancy in supine with L leg appearing shorter (not  shorter in long sitting) --> L LAD grade 4-5 followed by hip ADD and ABD 10x5" manually   [facility] Adult PT Treatment:                                                DATE: 02/07/22 Therapeutic Exercise: LTR x 1 minute  Cat/cow x 10  Sidelying thoracic rotation x 10 each  Supine pelvic tilts 2 x 10  Hip bridge with posterior pelvic tilt 2 x 10  SLR with posterior pelvic tilt 2 x 10  Supine TA march 2 x 10  Sidelying hip abduction 2 x 10  Modified sideplank 2 x 20 sec each Updated HEP       PATIENT EDUCATION:   Education details: HEP Person educated: Patient and Child(ren) Education method: Explanation, Demonstration, Tactile cues, Verbal cues, handout  Education comprehension: verbalized understanding, returned demonstration, verbal cues required, tactile cues required, and needs further education   HOME EXERCISE PROGRAM: Access Code: [email or web address] URL: [email or web address] Date: 02/14/2022 Prepared by: Gardiner Rhyme  Exercises - Squat with Chair Touch and Resistance Loop  - 1-2 x daily - 7 x weekly - 2 sets - 10-15 reps - Sidelying Open Book Thoracic Lumbar Rotation and Extension  - 2 x daily - 7 x weekly - 1-2 sets - 10 reps - Standing Lumbar Extension  - 2 x daily - 7 x weekly - 1 sets - 10 reps - Supine Figure 4 Piriformis Stretch  - 2 x daily - 7 x weekly - 2 sets - 20 seconds hold - Supine Piriformis Stretch with Leg Straight  - 2 x daily - 7 x weekly - 2 sets - 20 seconds hold - Supine Pelvic Tilt with Straight Leg Raise  - 1 x daily - 7 x weekly - 2 sets - 10 reps - Sidelying Hip Abduction  - 1 x daily - 7 x weekly - 2 sets - 10 reps - Side Plank on Knees  - 1 x daily - 7 x weekly - 3 sets - 20 sec  hold - Side Stepping with Resistance at Ankles  - 1 x daily - 3 x weekly - 1-2 sets   ASSESSMENT:   CLINICAL IMPRESSION: Patient presents with lower back pain and sciatic symptoms into L glute/upper thigh today. She had a positive Gillett's test in R SLS for L pelvic upslip with subsequent L leg length discrepancy (shorter). Testing was negative and leg length symmetrical post treatment. Pt reported feeling good after session, and tolerated all treatment well. Added band resisted side stepping to home program and educated on correction of long axis distraction for son/husband to help with followed by stability, as needed.     OBJECTIVE IMPAIRMENTS: decreased strength, hypomobility, increased fascial restrictions, impaired perceived functional ability, increased muscle spasms,  impaired flexibility, improper body mechanics, postural dysfunction, and pain.    ACTIVITY LIMITATIONS: carrying, lifting, bending, and squatting   PARTICIPATION LIMITATIONS: cleaning, community activity, and occupation   PERSONAL FACTORS: Past/current experiences and Profession are also affecting patient's functional outcome.    REHAB POTENTIAL: Good   CLINICAL DECISION MAKING: Stable/uncomplicated   EVALUATION COMPLEXITY: Low     GOALS: Goals reviewed with patient? No   SHORT TERM GOALS: Target date: 02/22/2022   Pt will be I and compliant with initial HEP. Baseline: provided at eval Goal status: INITIAL   LONG  TERM GOALS: Target date: 04/06/22   Pt will be independent with advanced HEP. Baseline: not provided  Goal status: INITIAL   2.  Pt will increase hip MMT to 4+/5 B. Baseline: see chart above Goal status: INITIAL   3.  Pt will demonstrate squat with proper hip hinge and hip/knee/ankle alignment. Baseline: R knee valgus, excessive anterior knee translation B, rounded spine with limited hip hinge Goal status: INITIAL   4.  Pt will demonstrate proper lifting mechanics of 25-50 lbs from floor <> waist without back pain. Baseline: poor mechanics Goal status: INITIAL   5.  Pt will report no more 3/10 with work duties. Baseline:  Goal status: INITIAL       PLAN:   PT FREQUENCY: 1-2x/week   PT DURATION: 8 weeks   PLANNED INTERVENTIONS: Therapeutic exercises, Therapeutic activity, Neuromuscular re-education, Balance training, Gait training, Patient/Family education, Self Care, Joint mobilization, Dry Needling, Spinal manipulation, Spinal mobilization, Cryotherapy, Moist heat, Taping, Manual therapy, and Re-evaluation.   PLAN FOR NEXT SESSION: assess response to HEP/update PRN, R hip flexibility, body mechanics, hip strength, spinal mobility    Phill Myron. Joelle [place], PT, DPT 03/07/22 3:00 PM

## 2022-03-07 NOTE — Therapy (Addendum)
OUTPATIENT PHYSICAL THERAPY TREATMENT NOTE/Discharge NOTE PHYSICAL THERAPY DISCHARGE SUMMARY  Visits from Start of Care: 4  Current functional level related to goals / functional outcomes: unknown   Remaining deficits: unknown   Education / Equipment: Initial HEP   Patient agrees to discharge. Patient goals were not met. Patient is being discharged due to not returning since the last visit.  Voncille Lo, PT, [place] Certified Exercise Expert for the Aging Adult  06/12/22 2:23 PM Phone: [phone] Fax: [phone]   Patient Name: [name] MRN: [id number] DOB:[birth year], 41 y.o., female Today's Date: 03/07/2022  PCP: none  REFERRING PROVIDER: Lanae Crumbly, PA-C   END OF SESSION:   PT End of Session - 03/07/22 1358     Visit Number 4    Number of Visits 16    Date for PT Re-Evaluation 04/06/22    Authorization Type [employer]    Authorization Time Period 10/27-11/28/23    Authorization - Number of Visits 4    PT Start Time [id number]    PT Stop Time 1435    PT Time Calculation (min) 40 min    Activity Tolerance Patient tolerated treatment well    Behavior During Therapy [facility] for tasks assessed/performed             History reviewed. No pertinent past medical history. Past Surgical History:  Procedure Laterality Date   CESAREAN SECTION     Patient Active Problem List   Diagnosis Date Noted   Dysmenorrhea 03/13/2014   Menorrhagia with regular cycle 03/13/2014   IUD (intrauterine device) in place 12/28/2013   Family history of breast cancer in female 07/14/2013   H. pylori infection 06/23/2013   GERD (gastroesophageal reflux disease) 06/22/2013   Visit for preventive health examination 06/22/2013   ANEMIA, MILD 07/24/2008    REFERRING DIAG:  M54.9 (ICD-10-CM) - Mid back pain  M41.25 (ICD-10-CM) - Other idiopathic scoliosis, thoracolumbar region  M53.3,G89.29 (ICD-10-CM) - Chronic left SI joint pain  M53.3,G89.29 (ICD-10-CM) - Chronic right SI  joint pain    THERAPY DIAG:  Other idiopathic scoliosis, thoracic region  Other low back pain  Muscle weakness (generalized)  Abnormal posture  Rationale for Evaluation and Treatment Rehabilitation  PERTINENT HISTORY: C-section   PRECAUTIONS: none  SUBJECTIVE:                                                                                                                                                                                     Son interprets as needed.  SUBJECTIVE STATEMENT:  Patient reports she had one bout of SIJ pain again, but her son did the instructed LAD on L hip which helped. When she  lies on her left side, she feels "a heartbeat in her left hip" sometimes.    PAIN:  Are you having pain? Yes: NPRS scale: 2/10 Pain location: Lt mid/lower back Pain description: pressure; stabbing Aggravating factors: unknown Relieving factors: rest    OBJECTIVE: (objective measures completed at initial evaluation unless otherwise dated) OBJECTIVE:    DIAGNOSTIC FINDINGS:  XR: mild left thoracic scoliosis   PATIENT SURVEYS:  Modified Oswestry 2/50    SCREENING FOR RED FLAGS: Bowel or bladder incontinence: No Spinal tumors: No Cauda equina syndrome: No Compression fracture: No Abdominal aneurysm: No   COGNITION: Overall cognitive status: Within functional limits for tasks assessed                          SENSATION: WFL   MUSCLE LENGTH: Hamstrings: Right nt deg; Left nt deg Thomas test: Right nt deg; Left nt deg   POSTURE:  R upper thoracic mild scoliosis, R hip hump with forward bend   PALPATION: TTP B PSIS, L thoracic and lumbar paraspinals     LUMBAR ROM:    AROM eval  Flexion WFL  Extension WFL (needed to instruct to not hinge at thoracolumbar junction)  Right lateral flexion Fingertips past R patella  Left lateral flexion Fingertips to L joint line  Right rotation WFL  Left rotation 25% decrease   (Blank rows = not tested)   LOWER EXTREMITY  ROM:      Passive  Right eval Left eval  Hip flexion [facility]  Hip extension      Hip abduction      Hip adduction      Hip internal rotation [facility]  Hip external rotation [facility]  Knee flexion      Knee extension      Ankle dorsiflexion      Ankle plantarflexion      Ankle inversion      Ankle eversion       (Blank rows = not tested)   LOWER EXTREMITY MMT:     MMT Right eval Left eval  Hip flexion 4/5 4/5  Hip extension 4-/5 4/5  Hip abduction 4-/5 4/5  Hip adduction      Hip internal rotation      Hip external rotation 4-/5 4/5  Knee flexion      Knee extension      Ankle dorsiflexion      Ankle plantarflexion      Ankle inversion      Ankle eversion       (Blank rows = not tested)   LUMBAR SPECIAL TESTS:  NT   FUNCTIONAL TESTS:    Squat: R knee valgus, excessive anterior knee translation B, rounded spine with limited hip hinge --> instructed pt in hip hinge for deadlift with dowel along spine, cuing knee/ankle/hip alignment with red theraband tied above knees; additionally instructed on lifting/lowering mechanics with 10# DB from floor  SLS: initially hanging on joints --> normal after instruction   GAIT: Distance walked: 100 ft x 2 Assistive device utilized: None Level of assistance: Complete Independence Comments: unremarkable   [facility] Adult PT Treatment:                                                DATE: 03/07/22 Therapeutic Exercise: Sidelying Book Opener x10 L, x5 R  Plank elevated on table with foam roller alignment on spine 3x15" Serratus push-ups x8 elevated Elevated Plank + rotation x8 QP rotation B x5 Child's Pose x20" with OP  Child's Pose B sidebending x15" with OP Manual Therapy: STM/XFM to L QL, psoas via posterior route, L glute and piriformis with hip IR/ER Rib rocking Lumbar PA grade 3 L hip LAD grade 3-5   [facility] Adult PT Treatment:                                                DATE: 02/14/22 Therapeutic Exercise: Hip ADD with  ball 10x5" Hip ABD with blue theraband 10x5" Bridge with blue band ABD 10x5"  Side stepping with blue band at ankles 15' -> added to HEP and provided handout Manual Therapy: Positive Gillett's test in SLS for L pelvis upslip --> negative at end of session STM to L glutes, added hip IR/ER for passive piriformis pumping (very tender near sacrum) Sacral mobs into extension and flexion with extension relieving some pain Lower lumbar PA grade 3 Notable L leg length discrepancy in supine with L leg appearing shorter (not shorter in long sitting) --> L LAD grade 4-5 followed by hip ADD and ABD 10x5" manually   [facility] Adult PT Treatment:                                                DATE: 02/07/22 Therapeutic Exercise: LTR x 1 minute  Cat/cow x 10  Sidelying thoracic rotation x 10 each  Supine pelvic tilts 2 x 10  Hip bridge with posterior pelvic tilt 2 x 10  SLR with posterior pelvic tilt 2 x 10  Supine TA march 2 x 10  Sidelying hip abduction 2 x 10  Modified sideplank 2 x 20 sec each Updated HEP       PATIENT EDUCATION:  Education details: HEP Person educated: Patient and Child(ren) Education method: Explanation, Demonstration, Tactile cues, Verbal cues, handout  Education comprehension: verbalized understanding, returned demonstration, verbal cues required, tactile cues required, and needs further education   HOME EXERCISE PROGRAM: Access Code: [email or web address] URL: [email or web address] Date: 02/14/2022 Prepared by: [place] with Chair Touch and Resistance Loop  - 1-2 x daily - 7 x weekly - 2 sets - 10-15 reps - Sidelying Open Book Thoracic Lumbar Rotation and Extension  - 2 x daily - 7 x weekly - 1-2 sets - 10 reps - Standing Lumbar Extension  - 2 x daily - 7 x weekly - 1 sets - 10 reps - Supine Figure 4 Piriformis Stretch  - 2 x daily - 7 x weekly - 2 sets - 20 seconds hold - Supine Piriformis Stretch with Leg Straight  - 2 x daily - 7 x weekly - 2  sets - 20 seconds hold - Supine Pelvic Tilt with Straight Leg Raise  - 1 x daily - 7 x weekly - 2 sets - 10 reps - Sidelying Hip Abduction  - 1 x daily - 7 x weekly - 2 sets - 10 reps - Side Plank on Knees  - 1 x daily - 7 x weekly - 3 sets - 20 sec  hold - Side Stepping with Resistance at Ankles  -  1 x daily - 3 x weekly - 1-2 sets   ASSESSMENT:   CLINICAL IMPRESSION: Patient presents with low grade left-sided low back pain 2/10 today. (-) Gillett's test and forward bend today. She continues to be TTP to left side of back and glute with manual therapy. Well tolerated session otherwise without adverse reactions. Progressed thoracic rotation and core work today, noting difficulty achieving optimal alignment during elevated plank secondary to upper body weakness. Provided handouts for quadruped rotation, child's pose and child's pose with sidebending.   OBJECTIVE IMPAIRMENTS: decreased strength, hypomobility, increased fascial restrictions, impaired perceived functional ability, increased muscle spasms, impaired flexibility, improper body mechanics, postural dysfunction, and pain.    ACTIVITY LIMITATIONS: carrying, lifting, bending, and squatting   PARTICIPATION LIMITATIONS: cleaning, community activity, and occupation   PERSONAL FACTORS: Past/current experiences and Profession are also affecting patient's functional outcome.    REHAB POTENTIAL: Good   CLINICAL DECISION MAKING: Stable/uncomplicated   EVALUATION COMPLEXITY: Low     GOALS: Goals reviewed with patient? No   SHORT TERM GOALS: Target date: 02/22/2022   Pt will be I and compliant with initial HEP. Baseline: provided at eval Goal status: INITIAL   LONG TERM GOALS: Target date: 04/06/22   Pt will be independent with advanced HEP. Baseline: not provided  Goal status: INITIAL   2.  Pt will increase hip MMT to 4+/5 B. Baseline: see chart above Goal status: INITIAL   3.  Pt will demonstrate squat with proper hip hinge  and hip/knee/ankle alignment. Baseline: R knee valgus, excessive anterior knee translation B, rounded spine with limited hip hinge Goal status: INITIAL   4.  Pt will demonstrate proper lifting mechanics of 25-50 lbs from floor <> waist without back pain. Baseline: poor mechanics Goal status: INITIAL   5.  Pt will report no more 3/10 with work duties. Baseline:  Goal status: INITIAL       PLAN:   PT FREQUENCY: 1-2x/week   PT DURATION: 8 weeks   PLANNED INTERVENTIONS: Therapeutic exercises, Therapeutic activity, Neuromuscular re-education, Balance training, Gait training, Patient/Family education, Self Care, Joint mobilization, Dry Needling, Spinal manipulation, Spinal mobilization, Cryotherapy, Moist heat, Taping, Manual therapy, and Re-evaluation.   PLAN FOR NEXT SESSION: assess response to HEP/update PRN, R hip flexibility, body mechanics, hip strength, spinal mobility including rotation    Phill Myron. Asjah Rauda, PT, DPT 03/07/22 1:59 PM

## 2022-03-08 ENCOUNTER — Other Ambulatory Visit: Payer: Medicaid Other

## 2022-03-11 ENCOUNTER — Ambulatory Visit
Admission: EM | Admit: 2022-03-11 | Discharge: 2022-03-11 | Disposition: A | Discharge status: Home / Self Care | Payer: Medicaid Other | Attending: Urgent Care | Admitting: Urgent Care

## 2022-03-11 DIAGNOSIS — B349 Viral infection, unspecified: Secondary | ICD-10-CM | POA: Insufficient documentation

## 2022-03-11 DIAGNOSIS — Z1152 Encounter for screening for COVID-19: Secondary | ICD-10-CM | POA: Insufficient documentation

## 2022-03-11 LAB — RESP PANEL BY RT-PCR (FLU A&B, COVID) ARPGX2
Influenza A by PCR: POSITIVE — AB
Influenza B by PCR: NEGATIVE
SARS Coronavirus 2 by RT PCR: NEGATIVE

## 2022-03-11 MED ORDER — IPRATROPIUM BROMIDE 0.03 % NA SOLN: 2.0000 | Freq: Two times a day (BID) | NASAL | 0 refills | Status: DC

## 2022-03-11 MED ORDER — CETIRIZINE HCL 10 MG PO TABS: 10.0000 mg | ORAL_TABLET | Freq: Every day | ORAL | 0 refills | Status: DC

## 2022-03-11 MED ORDER — ACETAMINOPHEN 325 MG PO TABS: 650.0000 mg | ORAL_TABLET | Freq: Four times a day (QID) | ORAL | 0 refills | Status: DC | PRN

## 2022-03-11 MED ORDER — IBUPROFEN 600 MG PO TABS: 600.0000 mg | ORAL_TABLET | Freq: Four times a day (QID) | ORAL | 0 refills | Status: DC | PRN

## 2022-03-11 MED ORDER — PROMETHAZINE-DM 6.25-15 MG/5ML PO SYRP: 2.5000 mL | ORAL_SOLUTION | Freq: Three times a day (TID) | ORAL | 0 refills | Status: DC | PRN

## 2022-03-11 NOTE — ED Provider Notes (Signed)
[facility]  Note:  This document was prepared using Systems analyst and may include unintentional dictation errors.  MRN: [id number] DOB: Feb 21, 1981  Subjective:   [name] is a 41 y.o. female presenting for 2-day history of acute onset sinus congestion, coughing, malaise and fatigue.  No chest pain, shortness of breath or wheezing.  Has had COVID exposure, presents with her son who is sick as well and both would like testing.  No current facility-administered medications for this encounter.  Current Outpatient Medications:    Multiple Vitamin (MULTIVITAMIN) tablet, Take 1 tablet by mouth daily., Disp: , Rfl:    promethazine-dextromethorphan (PROMETHAZINE-DM) 6.25-15 MG/5ML syrup, Take 2.5 mLs by mouth 3 (three) times daily as needed for cough., Disp: 100 mL, Rfl: 0   Vitamin D, Ergocalciferol, (DRISDOL) 1.25 MG (50000 UNIT) CAPS capsule, Take 1 capsule (50,000 Units total) by mouth every 7 (seven) days., Disp: 12 capsule, Rfl: 0   No Known Allergies  History reviewed. No pertinent past medical history.   Past Surgical History:  Procedure Laterality Date   CESAREAN SECTION      Family History  Problem Relation Age of Onset   Breast cancer Mother 41       deceased 78   Cancer Father        prostate   Hypertension Father    Breast cancer Maternal Grandmother 56       deceased 33s    Social History   Tobacco Use   Smoking status: Former   Smokeless tobacco: Never  Scientific laboratory technician Use: Never used  Substance Use Topics   Alcohol use: Yes    Comment: socially   Drug use: No    ROS   Objective:   Vitals: BP 118/70 (BP Location: Left Arm)   Pulse (!) 115   Temp 98.8 F (37.1 C) (Oral)   Resp 18   SpO2 100%   Physical Exam Constitutional:      General: She is not in acute distress.    Appearance: Normal appearance. She is well-developed and normal weight. She is not ill-appearing, toxic-appearing or  diaphoretic.  HENT:     Head: Normocephalic and atraumatic.     Right Ear: Tympanic membrane, ear canal and external ear normal. No drainage or tenderness. No middle ear effusion. There is no impacted cerumen. Tympanic membrane is not erythematous or bulging.     Left Ear: Tympanic membrane, ear canal and external ear normal. No drainage or tenderness.  No middle ear effusion. There is no impacted cerumen. Tympanic membrane is not erythematous or bulging.     Nose: Nose normal. No congestion or rhinorrhea.     Mouth/Throat:     Mouth: Mucous membranes are moist. No oral lesions.     Pharynx: No pharyngeal swelling, oropharyngeal exudate, posterior oropharyngeal erythema or uvula swelling.     Tonsils: No tonsillar exudate or tonsillar abscesses.  Eyes:     General: No scleral icterus.       Right eye: No discharge.        Left eye: No discharge.     Extraocular Movements: Extraocular movements intact.     Right eye: Normal extraocular motion.     Left eye: Normal extraocular motion.     Conjunctiva/sclera: Conjunctivae normal.  Cardiovascular:     Rate and Rhythm: Normal rate and regular rhythm.     Heart sounds: Normal heart sounds. No murmur heard.    No  friction rub. No gallop.  Pulmonary:     Effort: Pulmonary effort is normal. No respiratory distress.     Breath sounds: No stridor. No wheezing, rhonchi or rales.  Chest:     Chest wall: No tenderness.  Musculoskeletal:     Cervical back: Normal range of motion and neck supple.  Lymphadenopathy:     Cervical: No cervical adenopathy.  Skin:    General: Skin is warm and dry.  Neurological:     General: No focal deficit present.     Mental Status: She is alert and oriented to person, place, and time.     Cranial Nerves: No cranial nerve deficit, dysarthria or facial asymmetry.     Motor: No weakness.     Coordination: Romberg sign negative. Coordination normal.     Gait: Gait normal.     Deep Tendon Reflexes: Reflexes normal.   Psychiatric:        Mood and Affect: Mood normal.        Speech: Speech normal.        Behavior: Behavior normal.     Assessment and Plan :   PDMP not reviewed this encounter.  1. Acute viral syndrome     No history of CKD, patient would like to take COVID antivirals.  Deferred blood work.  Will be reasonable to start on the for Paxlovid dose.  Deferred imaging given clear cardiopulmonary exam, hemodynamically stable vital signs.   Will manage for viral illness such as viral URI, viral syndrome, viral rhinitis, COVID-19. Recommended supportive care. Offered scripts for symptomatic relief. Testing is pending. Counseled patient on potential for adverse effects with medications prescribed/recommended today, ER and return-to-clinic precautions discussed, patient verbalized understanding.     Wallis Bamberg, [place] 03/11/22 1905

## 2022-03-11 NOTE — ED Triage Notes (Signed)
C/o cough and nasal congestion x 2 days. Pt is taking Nyquil.

## 2022-03-14 ENCOUNTER — Ambulatory Visit: Payer: Medicaid Other | Admitting: Family Medicine

## 2022-03-14 ENCOUNTER — Ambulatory Visit: Payer: Medicaid Other

## 2022-03-17 ENCOUNTER — Telehealth: Payer: Self-pay | Admitting: *Deleted

## 2022-03-17 DIAGNOSIS — Z803 Family history of malignant neoplasm of breast: Secondary | ICD-10-CM

## 2022-03-17 DIAGNOSIS — R9230 Dense breasts, unspecified: Secondary | ICD-10-CM

## 2022-03-17 NOTE — Telephone Encounter (Signed)
-----   Message from [place] sent at 03/14/2022  3:00 PM EST ----- Regarding: RE: MRI Patient needs to find an imaging provider that is INN with ins and we can re-auth for that location. ----- Message ----- From: Daryll Brod, RMA Sent: 03/07/2022   4:14 PM EST To: Camelia Phenes Subject: MRI                                            Patient had questions pertaining to why her MRI was canceled. I seen notes in the system regarding this t   Did Jaylen follow up with you on this one in particular?

## 2022-03-17 NOTE — Telephone Encounter (Signed)
Spoke with patient. Patient had concerns about MRI. [facility] was in network before. Per  Manager pre-cert out of network now.

## 2022-03-17 NOTE — Telephone Encounter (Signed)
Camelia Phenes sent to Daryll Brod, RMA Patient needs to find an imaging provider that is INN with ins and we can re-auth for that location.

## 2022-03-20 NOTE — Telephone Encounter (Signed)
Called and spoke with patient to inform order placed at [facility] where they will be calling her to setup appt.

## 2022-03-20 NOTE — Telephone Encounter (Signed)
Staff message received by Marena Chancy patient husband gave the office names of the follow locations to schedule MRI:  Husband came in and gave these 3 facilities  [facility]: [phone]  [facility]: [phone]  [facility]: [phone]    [facility] and [facility] are not imaging centers. I called centralized scheduling main line for the hospital and they only do breast MRI's at [facility]. Order placed at [facility] they will call patient to schedule.   Per telephone encounter 01/21/22 "Order MRI of Breasts:  Breast very dense category D.  The patient has strong family  history of breast cancer in her mother who was diagnosed at age 50  and her maternal grandmother who was diagnosed at age 64, and both  passed away in their 78s from the disease. She has had blood test  for gene mutations, which was negative.

## 2022-03-28 ENCOUNTER — Ambulatory Visit: Payer: Medicaid Other | Admitting: Orthopedic Surgery

## 2022-03-28 ENCOUNTER — Encounter: Payer: Self-pay | Admitting: Orthopedic Surgery

## 2022-03-28 ENCOUNTER — Ambulatory Visit: Payer: Medicaid Other | Admitting: Surgery

## 2022-03-28 VITALS — BP 119/83 | HR 80 | Ht 62.0 in | Wt 168.0 lb

## 2022-03-28 DIAGNOSIS — G5603 Carpal tunnel syndrome, bilateral upper limbs: Secondary | ICD-10-CM | POA: Diagnosis not present

## 2022-03-28 DIAGNOSIS — S73192A Other sprain of left hip, initial encounter: Secondary | ICD-10-CM | POA: Diagnosis not present

## 2022-03-28 DIAGNOSIS — G5602 Carpal tunnel syndrome, left upper limb: Secondary | ICD-10-CM | POA: Diagnosis not present

## 2022-03-28 DIAGNOSIS — G5601 Carpal tunnel syndrome, right upper limb: Secondary | ICD-10-CM

## 2022-03-28 NOTE — Progress Notes (Signed)
Orthopedic Spine Surgery Office Note  Assessment: Patient is a 41 y.o. female with 2 chronic issues.  Left-sided posterior hip pain with some groin pain.  Exam seems to be consistent with labral tear with a positive FADIR and C sign Bilateral wrist and hand pain with positive Durkan bilaterally and pain at night.  This seems to be carpal tunnel syndrome   Plan: -Explained that initially conservative treatment is tried as a significant number of patients may experience relief with these treatment modalities. Discussed that the conservative treatments include:  -activity modification  -physical therapy  -over the counter pain medications  -steroid injections -Patient has tried  PT, tylenol, NSAIDs, activity modification -Patient has done greater than 6 weeks of physical therapy and home exercises without any relief of her pain, so recommended MRI of the left hip to evaluate for labral tear -In regards to her wrist, I feel that this is carpal tunnel syndrome.  I recommended that she use a brace as initial nonoperative treatment -Patient should return to office in 4 weeks, x-rays at next visit: none   Patient expressed understanding of the plan and all questions were answered to the patient's satisfaction.   ___________________________________________________________________________   History:  Patient is a 41 y.o. female who presents today for two issues.  Her first issue is left-sided posterior hip pain.  She feels pain in her groin and in the posterior aspect of her hip.  The posterior aspect of the hip is worse in the groin.  She has felt this for several months now.  There is no trauma or injury that brought on the pain.  Pain is worse with activity improves with rest.  Pain is gotten slightly better since she was last seen by Zonia Kief.  He referred her to rheumatology because she had a positive ANA.  She has been able to ambulate without assistive devices.  She has no pain on the  right side.  She has no pain distal to the hip on either side.  Her second issue is bilateral hand pains.  Patient states that these been there for over 6 months.  She feels that in all of her fingers.  Starts at the distal forearm and goes into the fingers bilaterally.  She notices it is worse at night and improves during the day.  She feels numbness in that same distribution.  She sometimes has tingling in that distribution as well.   Weakness: Denies Symptoms of imbalance: Denies Paresthesias and numbness: Yes, into bilateral hands.  No lower extremity numbness or paresthesias Bowel or bladder incontinence: Denies Saddle anesthesia: Denies  Treatments tried: PT, tylenol, NSAIDs, activity modification  Review of systems: Denies fevers and chills, night sweats, unexplained weight loss, history of cancer, pain that wakes them at night  Past medical history: None  Allergies: NKDA  Past surgical history:  C section  Social history: Denies use of nicotine product (smoking, vaping, patches, smokeless) Alcohol use: social Denies recreational drug use  Physical Exam:  General: no acute distress, appears stated age Neurologic: alert, answering questions appropriately, following commands Respiratory: unlabored breathing on room air, symmetric chest rise Psychiatric: appropriate affect, normal cadence to speech   MSK (spine):  -Strength exam      Left  Right EHL    5/5  5/5 TA    5/5  5/5 GSC    5/5  5/5 Knee extension  5/5  5/5 Hip flexion   5/5  5/5  -Sensory exam    Sensation  intact to light touch in L3-S1 nerve distributions of bilateral lower extremities  -Achilles DTR: 2/4 on the left, 2/4 on the right -Patellar tendon DTR: 2/4 on the left, 2/4 on the right  -Straight leg raise: negative -Contralateral straight leg raise: negative -Clonus: no beats bilaterally  -Left hip exam: Positive FADIR -points to pain in the groin and posterior hip, negative FABER,  negative Gaenslen's, negative SI joint compression test, no pain through range of motion of the hip except at maximal internal rotation, negative Stinchfield -Right hip exam: no pain through range of motion, negative stinchfield, negative FABER, negative Gaenslen's  -Negative Hoffmann bilaterally, negative grip and release test, no instability with tandem gait, negative Romberg  -Positive Durkan's bilaterally, negative Phalen's bilaterally, negative Tinel's at the wrist bilaterally, negative Tinel's at the elbow bilaterally  Imaging: XR of the scoli spine from 01/22/2022 was independently reviewed and interpreted, showing 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  No significant degenerative changes within the lumbar spine.  Lordotic alignment.  No significant degenerative changes within bilateral hips.  No fracture or dislocation seen.   Patient name: [name] Patient MRN: [id number] Date of visit: 03/28/22

## 2022-04-02 NOTE — Telephone Encounter (Signed)
Patient is scheduled 04/11/2022 at 5pm for Breast MRI.  Routed to [facility]. To check on PA.

## 2022-04-11 ENCOUNTER — Ambulatory Visit (HOSPITAL_COMMUNITY)
Admission: RE | Admit: 2022-04-11 | Discharge: 2022-04-11 | Disposition: A | Discharge status: Home / Self Care | Payer: Medicaid Other | Source: Ambulatory Visit | Attending: Obstetrics & Gynecology | Admitting: Obstetrics & Gynecology

## 2022-04-11 ENCOUNTER — Ambulatory Visit (HOSPITAL_COMMUNITY): Payer: Medicaid Other

## 2022-04-11 DIAGNOSIS — Z139 Encounter for screening, unspecified: Secondary | ICD-10-CM | POA: Diagnosis not present

## 2022-04-11 DIAGNOSIS — R922 Inconclusive mammogram: Secondary | ICD-10-CM | POA: Insufficient documentation

## 2022-04-11 DIAGNOSIS — R9230 Dense breasts, unspecified: Secondary | ICD-10-CM | POA: Diagnosis not present

## 2022-04-11 DIAGNOSIS — Z803 Family history of malignant neoplasm of breast: Secondary | ICD-10-CM | POA: Insufficient documentation

## 2022-04-11 MED ORDER — GADOBUTROL 1 MMOL/ML IV SOLN
7.5000 mL | Freq: Once | INTRAVENOUS | Status: AC | PRN
  Administered 2022-04-11: 7.5 mL via INTRAVENOUS

## 2022-04-13 ENCOUNTER — Ambulatory Visit
Admission: RE | Admit: 2022-04-13 | Discharge: 2022-04-13 | Disposition: A | Discharge status: Home / Self Care | Payer: Medicaid Other | Source: Ambulatory Visit | Attending: Orthopedic Surgery | Admitting: Orthopedic Surgery

## 2022-04-13 DIAGNOSIS — S73192A Other sprain of left hip, initial encounter: Secondary | ICD-10-CM

## 2022-04-13 DIAGNOSIS — M25552 Pain in left hip: Secondary | ICD-10-CM | POA: Diagnosis not present

## 2022-04-14 ENCOUNTER — Ambulatory Visit (INDEPENDENT_AMBULATORY_CARE_PROVIDER_SITE_OTHER): Payer: Medicaid Other | Admitting: Primary Care

## 2022-04-15 ENCOUNTER — Encounter: Payer: Self-pay | Admitting: Obstetrics & Gynecology

## 2022-04-30 ENCOUNTER — Ambulatory Visit: Payer: Medicaid Other | Admitting: Orthopedic Surgery

## 2022-04-30 DIAGNOSIS — S73192D Other sprain of left hip, subsequent encounter: Secondary | ICD-10-CM | POA: Diagnosis not present

## 2022-04-30 NOTE — Progress Notes (Deleted)
   Consult Note   Patient: [name]             Date of Birth: [birth year]           MRN: [id number]             Visit Date: 04/30/2022  Requested by: No referring provider defined for this encounter. PCP: Pcp, No  Thank you for asking me to evaluate this patient for No chief complaint on file.  Below are my findings.     Assessment & Plan: Visit Diagnoses: No diagnosis found.  Plan: ***  Follow Up Instructions: No follow-ups on file.  Orders: No orders of the defined types were placed in this encounter.  No orders of the defined types were placed in this encounter.    Procedures: No procedures performed   Clinical Data: No additional findings.   Subjective:  No chief complaint on file.    HPI  Review of Systems   Objective: Vital Signs: There were no vitals taken for this visit.  Physical Exam   Ortho Exam   Specialty Comments:  No specialty comments available.  Imaging:  No results found.   PMFS History: Patient Active Problem List   Diagnosis Date Noted   Dysmenorrhea 03/13/2014   Menorrhagia with regular cycle 03/13/2014   IUD (intrauterine device) in place 12/28/2013   Family history of breast cancer in female 07/14/2013   H. pylori infection 06/23/2013   GERD (gastroesophageal reflux disease) 06/22/2013   Visit for preventive health examination 06/22/2013   ANEMIA, MILD 07/24/2008   No past medical history on file.  Family History  Problem Relation Age of Onset   Breast cancer Mother 6       deceased 37   Cancer Father        prostate   Hypertension Father    Breast cancer Maternal Grandmother 63       deceased 24s   Past Surgical History:  Procedure Laterality Date   CESAREAN SECTION     Social History   Occupational History   Not on file  Tobacco Use   Smoking status: Former   Smokeless tobacco: Never  Scientific laboratory technician Use: Never used  Substance and Sexual Activity   Alcohol use: Yes    Comment: socially    Drug use: No   Sexual activity: Yes    Partners: Male    Birth control/protection: I.U.D.    Comment: 1si intercourse-21, partners- 5+, married- 37 yrs-Paragard inserted 2015

## 2022-04-30 NOTE — Progress Notes (Signed)
Orthopedic Spine Surgery Office Note  Assessment: Patient is a 42 y.o. female with with 2 chronic issues.   Left-sided posterior hip pain with some groin pain.  Exam seems to be consistent with labral tear with a positive FADIR and C sign.  MRI shows a labral tear Bilateral wrist and hand pain with positive Durkan bilaterally and pain at night.  This seems to be carpal tunnel syndrome.  Improved with conservative treatment   Plan: -Explained that initially conservative treatment is tried as a significant number of patients may experience relief with these treatment modalities. Discussed that the conservative treatments include:  -activity modification  -physical therapy  -over the counter pain medications  -steroid injections -Patient has tried PT, tylenol, NSAIDs, activity modification -Recommended diagnostic and therapeutic injection to the left hip.  Referral was provided to Dr. Rolena Infante -In regards to her bilateral carpal tunnel, recommended she continue with the braces since they have provided her with good relief  -Patient should return to office in 6 weeks, x-rays at next visit: None   Patient expressed understanding of the plan and all questions were answered to the patient's satisfaction.   ___________________________________________________________________________  History: Patient is a 42 y.o. female who has been previously seen in the office for symptoms of left posterior hip pain.  Since last time she was seen, pain in her posterior hip is about the same.  Pain is still worse with activity and improves with rest.  She is still ambulating without issue.  She has no pain on the right side.  She has no radiating pain down the extremity.  Denies paresthesia numbness in her lower extremities.  In regards to her bilateral hand pain that is worse at night.  That has gotten better with brace treatment.  Still present but not as painful.  Previous treatments: PT, tylenol, NSAIDs,  activity modification   COPY OF FIRST NOTE Patient is a 42 y.o. female who presents today for two issues.  Her first issue is left-sided posterior hip pain.  She feels pain in her groin and in the posterior aspect of her hip.  The posterior aspect of the hip is worse in the groin.  She has felt this for several months now.  There is no trauma or injury that brought on the pain.  Pain is worse with activity improves with rest.  Pain is gotten slightly better since she was last seen by Benjiman Core.  He referred her to rheumatology because she had a positive ANA.  She has been able to ambulate without assistive devices.  She has no pain on the right side.  She has no pain distal to the hip on either side.   Her second issue is bilateral hand pains.  Patient states that these been there for over 6 months.  She feels that in all of her fingers.  Starts at the distal forearm and goes into the fingers bilaterally.  She notices it is worse at night and improves during the day.  She feels numbness in that same distribution.  She sometimes has tingling in that distribution as well. END OF COPY  Physical Exam:  General: no acute distress, appears stated age Neurologic: alert, answering questions appropriately, following commands Respiratory: unlabored breathing on room air, symmetric chest rise Psychiatric: appropriate affect, normal cadence to speech   MSK (spine):  -Strength exam      Left  Right EHL    5/5  5/5 TA    5/5  5/5 [place]  5/5  5/5 Knee extension  5/5  5/5 Hip flexion   5/5  5/5  -Sensory exam    Sensation intact to light touch in L3-S1 nerve distributions of bilateral lower extremities  -Straight leg raise: negative -Contralateral straight leg raise: negative -Clonus: no beats bilaterally  -Left hip exam: positive FADIR, pain with flexion past 90 degrees, no pain with external rotation at the hip, negative FABER -Right hip exam: no pain through range of motion, negative  stinchfield  Imaging: XR of the scoli spine from 01/22/2022 was previously independently reviewed and interpreted, showing 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  No significant degenerative changes within the lumbar spine.  Lordotic alignment.  No significant degenerative changes within bilateral hips.  No fracture or dislocation seen.   MRI of the left hip from 04/14/2022 was independently reviewed and interpreted, showing left anterosuperior labral tear. No significant degenerative changes. No AVN. No periarticular joint reaction around the SI joints.    Patient name: [name] Patient MRN: [id number] Date of visit: 04/30/22

## 2022-05-04 ENCOUNTER — Other Ambulatory Visit: Payer: Self-pay | Admitting: Obstetrics & Gynecology

## 2022-05-08 NOTE — Telephone Encounter (Signed)
Last prescribed May 2023 with 2 refills. I recommend she see Dr. Dellis Filbert first for further refills.

## 2022-05-08 NOTE — Telephone Encounter (Signed)
Spoke to patient, patient requests refill on phentermine 37.5mg .  Patient states she prefers capsules.  Last AEX 08/09/21 with Dr. Dellis Filbert.  Weight 164lb Height 65.5in.  Sent to Marny Lowenstein NP for recommendation.

## 2022-05-09 ENCOUNTER — Ambulatory Visit: Payer: Medicaid Other | Admitting: Sports Medicine

## 2022-05-09 ENCOUNTER — Ambulatory Visit: Payer: Medicaid Other | Admitting: Orthopedic Surgery

## 2022-05-12 ENCOUNTER — Ambulatory Visit: Payer: Self-pay

## 2022-05-12 ENCOUNTER — Ambulatory Visit: Payer: Medicaid Other | Admitting: Sports Medicine

## 2022-05-12 DIAGNOSIS — M25552 Pain in left hip: Secondary | ICD-10-CM

## 2022-05-12 DIAGNOSIS — S73192D Other sprain of left hip, subsequent encounter: Secondary | ICD-10-CM | POA: Diagnosis not present

## 2022-05-12 MED ORDER — LIDOCAINE HCL 1 % IJ SOLN
4.0000 mL | INTRAMUSCULAR | Status: AC | PRN
  Administered 2022-05-12: 4 mL

## 2022-05-12 MED ORDER — METHYLPREDNISOLONE ACETATE 40 MG/ML IJ SUSP
80.0000 mg | INTRAMUSCULAR | Status: AC | PRN
  Administered 2022-05-12: 80 mg via INTRA_ARTICULAR

## 2022-05-12 NOTE — Progress Notes (Signed)
   Procedure Note  Patient: [name]             Date of Birth: 12/12/1980           MRN: [id number]             Visit Date: 05/12/2022  The use of an inpatient Spanish interpreter was used throughout the entirety of the visit.  Procedures: Visit Diagnoses:  1. Pain in left hip   2. Tear of left acetabular labrum, subsequent encounter    Large Joint Inj: L hip joint on 05/12/2022 4:23 PM Indications: pain Details: 22 G 3.5 in needle, ultrasound-guided anterior approach Medications: 4 mL lidocaine 1 %; 80 mg methylPREDNISolone acetate 40 MG/ML Outcome: tolerated well, no immediate complications  Procedure: US-guided intra-articular hip injection, left After discussion on risks/benefits/indications and informed verbal consent was obtained, a timeout was performed. Patient was lying supine on exam table. The hip was cleaned with betadine and alcohol swabs. Then utilizing ultrasound guidance, the patient's femoral head and neck junction was identified and subsequently injected with 4:2 lidocaine:depomedrol via an in-plane approach with ultrasound visualization of the injectate administered into the hip joint. Patient tolerated procedure well without immediate complications.  Procedure, treatment alternatives, risks and benefits explained, specific risks discussed. Consent was given by the patient. Immediately prior to procedure a time out was called to verify the correct patient, procedure, equipment, support staff and site/side marked as required. Patient was prepped and draped in the usual sterile fashion.     - I evaluated the patient about 10 minutes post-injection and she had good improvement in pain and range of motion - follow-up with Dr. Laurance Flatten as indicated; I am happy to see them as needed  Elba Barman, DO [place]  This note was dictated using Dragon naturally speaking software and may contain errors in  syntax, spelling, or content which have not been identified prior to signing this note.

## 2022-06-16 ENCOUNTER — Ambulatory Visit (INDEPENDENT_AMBULATORY_CARE_PROVIDER_SITE_OTHER): Payer: Medicaid Other | Admitting: Primary Care

## 2022-06-19 ENCOUNTER — Ambulatory Visit: Payer: Medicaid Other | Attending: Internal Medicine | Admitting: Internal Medicine

## 2022-06-19 ENCOUNTER — Ambulatory Visit: Payer: Medicaid Other | Admitting: Orthopedic Surgery

## 2022-06-19 ENCOUNTER — Encounter: Payer: Self-pay | Admitting: Internal Medicine

## 2022-06-19 VITALS — BP 110/71 | HR 76 | Resp 16 | Ht 62.5 in | Wt 177.0 lb

## 2022-06-19 DIAGNOSIS — M79641 Pain in right hand: Secondary | ICD-10-CM | POA: Diagnosis not present

## 2022-06-19 DIAGNOSIS — G8929 Other chronic pain: Secondary | ICD-10-CM | POA: Diagnosis not present

## 2022-06-19 DIAGNOSIS — M549 Dorsalgia, unspecified: Secondary | ICD-10-CM | POA: Diagnosis not present

## 2022-06-19 DIAGNOSIS — R768 Other specified abnormal immunological findings in serum: Secondary | ICD-10-CM

## 2022-06-19 DIAGNOSIS — M1612 Unilateral primary osteoarthritis, left hip: Secondary | ICD-10-CM

## 2022-06-19 NOTE — Progress Notes (Signed)
Office Visit Note  Patient: [name]             Date of Birth: [birth year]           MRN: [id number]             PCP: Pcp, No Referring: Lanae Crumbly, PA-C Visit Date: 06/19/2022 Occupation: Assembly aircraft components  Subjective:  New Patient (Initial Visit)   History of Present Illness: [name] is a 42 y.o. female here for evaluation of positive ANA checked associated with right hand pain, chronic back and bilateral hip pain.  Symptoms have been problematic for about the past 1 year.  She does not recall any preceding injury or specific change in activity with this.  Back pain and hip pain present daily but usually gets worse by later in the day with prolonged standing and bending does not have significant morning stiffness.  Right hand pain sometimes with numbness and pain sometimes radiates up to the shoulder.  She gets numbness in the morning most often provoked when she is using her hand for work doing parts assembly after long periods of time has to stop and stretch or shake out her hand.  Notices mild swelling but not all the time. She was recommended to use wrist brace at night for CTS which has been partially helpful.  For back problems she was referred to physical therapy does not think this was particularly beneficial.  She went for SI joint injection but also not sure how helpful this was. 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 Scoliosis present on spine xray she does not know if this has been present since childhood or not.  Lab testing demonstrated a moderately positive ANA otherwise normal inflammatory markers. She has had occasional painful mouth sores most recently on the left cheek.  Describes generalized hair thinning without any scalp rash or focal areas of hair loss.  No persistent lymphadenopathy.  No new rashes or Raynaud's symptoms.  Labs reviewed 01/2022 ANA 1:160 DFS RF neg ESR 2 CRP  0.5  Imaging reviewed 04/13/22 MRI Left Hip IMPRESSION: 1. Mild partial-thickness cartilage loss of the left femoral head and acetabulum. 2. Mild partial-thickness cartilage loss of the right femoral head and acetabulum. 3. Superior anterior left labral tear. 4. Mild tendinosis of the left hamstring origin.  Activities of Daily Living:  Patient reports morning stiffness for 0 minute.   Patient Reports nocturnal pain.  Difficulty dressing/grooming: Denies Difficulty climbing stairs: Denies Difficulty getting out of chair: Denies Difficulty using hands for taps, buttons, cutlery, and/or writing: Denies  Review of Systems  Constitutional:  Positive for fatigue.  HENT:  Positive for mouth sores and mouth dryness.   Eyes:  Positive for dryness.  Respiratory:  Negative for shortness of breath.   Cardiovascular:  Negative for chest pain and palpitations.  Gastrointestinal:  Positive for constipation and diarrhea. Negative for blood in stool.  Endocrine: Positive for increased urination.  Genitourinary:  Negative for involuntary urination.  Musculoskeletal:  Positive for joint pain, joint pain, myalgias, muscle weakness, muscle tenderness and myalgias. Negative for gait problem, joint swelling and morning stiffness.  Skin:  Positive for sensitivity to sunlight. Negative for color change, rash and hair loss.  Allergic/Immunologic: Negative for susceptible to infections.  Neurological:  Negative for dizziness and headaches.  Hematological:  Negative for swollen glands.  Psychiatric/Behavioral:  Positive for sleep disturbance. Negative for depressed mood. The patient is nervous/anxious.  PMFS History:  Patient Active Problem List   Diagnosis Date Noted   Positive ANA (antinuclear antibody) 06/19/2022   Back pain 06/19/2022   Arthritis of left hip 06/19/2022   Pain in right hand 06/19/2022   Dysmenorrhea 03/13/2014   Menorrhagia with regular cycle 03/13/2014   IUD (intrauterine  device) in place 12/28/2013   Family history of breast cancer in female 07/14/2013   H. pylori infection 06/23/2013   GERD (gastroesophageal reflux disease) 06/22/2013   Visit for preventive health examination 06/22/2013   ANEMIA, MILD 07/24/2008    History reviewed. No pertinent past medical history.  Family History  Problem Relation Age of Onset   Breast cancer Mother 42       deceased 87   Cancer Father        prostate   Hypertension Father    Breast cancer Maternal Grandmother 40       deceased 56s   Past Surgical History:  Procedure Laterality Date   CESAREAN SECTION     Social History   Social History Narrative   Not on file   Immunization History  Administered Date(s) Administered   Influenza Whole 01/14/2008   Influenza,inj,Quad PF,6+ Mos 03/13/2014, 01/13/2019   Influenza-Unspecified 01/26/2017     Objective: Vital Signs: BP 110/71 (BP Location: Right Arm, Patient Position: Sitting, Cuff Size: Normal)   Pulse 76   Resp 16   Ht 5' 2.5" (1.588 m)   Wt 177 lb (80.3 kg)   BMI 31.86 kg/m    Physical Exam Eyes:     Conjunctiva/sclera: Conjunctivae normal.  Cardiovascular:     Rate and Rhythm: Normal rate and regular rhythm.  Pulmonary:     Effort: Pulmonary effort is normal.     Breath sounds: Normal breath sounds.  Lymphadenopathy:     Cervical: No cervical adenopathy.  Skin:    General: Skin is warm and dry.     Findings: No rash.  Neurological:     Mental Status: She is alert.  Psychiatric:        Mood and Affect: Mood normal.      Musculoskeletal Exam:  Shoulders full ROM no tenderness or swelling Elbows full ROM no tenderness or swelling Wrists full ROM pain radiating upper arm with percussion of the right wrist or with prolonged flexed position Fingers full ROM no tenderness or swelling Left hip pain provoked with FABER maneuver range of motion is grossly intact, mild lateral tenderness to pressure Knees full ROM no tenderness or  swelling Ankles full ROM no tenderness or swelling   Investigation: No additional findings.  Imaging: No results found.  Recent Labs: Lab Results  Component Value Date   WBC 10.2 01/22/2022   HGB 14.1 01/22/2022   PLT 321 01/22/2022   NA 138 08/16/2021   K 4.1 08/16/2021   CL 105 08/16/2021   CO2 28 08/16/2021   GLUCOSE 99 08/16/2021   BUN 7 08/16/2021   CREATININE 0.56 08/16/2021   BILITOT 0.3 08/16/2021   ALKPHOS 51 06/22/2013   AST 16 08/16/2021   ALT 20 08/16/2021   PROT 6.8 08/16/2021   ALBUMIN 4.7 06/22/2013   CALCIUM 9.1 08/16/2021   GFRAA  05/31/2008    >60        The eGFR has been calculated using the MDRD equation. This calculation has not been validated in all clinical situations. eGFR's persistently <60 mL/min signify possible Chronic Kidney Disease.    Speciality Comments: No specialty comments available.  Procedures:  No  procedures performed Allergies: Patient has no known allergies.   Assessment / Plan:     Visit Diagnoses: Positive ANA (antinuclear antibody) - Plan: RNP Antibody, Anti-Smith antibody, Sjogrens syndrome-A extractable nuclear antibody, Anti-DNA antibody, double-stranded, C3 and C4, Sjogrens syndrome-B extractable nuclear antibody  Positive ANA with joint pains generalized alopecia but no peripheral synovitis appreciated on exam today no other specific clinical criteria.  Will check specific antibody panel and serum complements as detailed above with low pretest suspicion for active autoimmune disease causing symptoms.  Chronic bilateral back pain, unspecified back location Arthritis of left hip  Has structural causes for back pain with scoliosis bilateral osteoarthritis and labral tear.  By my review of MRI from January does not have any problem severe enough to need surgical intervention right now.  Pain in right hand  I think hand symptoms are consistent with carpal tunnel syndrome. She is using a wrist brace as directed by  Dr. Laurance Flatten. Provided ROM exercises handout for this as well. She is not interested in pursuing injections or carpal tunnel release surgery at this time so no need to do NCS/EMG unless getting worse.  Orders: Orders Placed This Encounter  Procedures   RNP Antibody   Anti-Smith antibody   Sjogrens syndrome-A extractable nuclear antibody   Anti-DNA antibody, double-stranded   C3 and C4   Sjogrens syndrome-B extractable nuclear antibody   No orders of the defined types were placed in this encounter.    Follow-Up Instructions: Return if symptoms worsen or fail to improve.   Collier Salina, MD  Note - This record has been created using [employer].  Chart creation errors have been sought, but may not always  have been located. Such creation errors do not reflect on  the standard of medical care.

## 2022-06-20 ENCOUNTER — Ambulatory Visit: Payer: Medicaid Other | Admitting: Sports Medicine

## 2022-06-20 ENCOUNTER — Ambulatory Visit: Payer: Medicaid Other | Admitting: Orthopedic Surgery

## 2022-06-20 LAB — SJOGRENS SYNDROME-B EXTRACTABLE NUCLEAR ANTIBODY: SSB (La) (ENA) Antibody, IgG: <1 AI

## 2022-06-20 LAB — RNP ANTIBODY: Ribonucleic Protein(ENA) Antibody, IgG: <1 AI

## 2022-06-20 LAB — ANTI-SMITH ANTIBODY: ENA SM Ab Ser-aCnc: <1 AI

## 2022-06-20 LAB — ANTI-DNA ANTIBODY, DOUBLE-STRANDED: ds DNA Ab: 2 IU/mL

## 2022-06-20 LAB — C3 AND C4
C3 Complement: 148 mg/dL (ref 83–193)
C4 Complement: 42 mg/dL (ref 15–57)

## 2022-06-20 LAB — SJOGRENS SYNDROME-A EXTRACTABLE NUCLEAR ANTIBODY: SSA (Ro) (ENA) Antibody, IgG: <1 AI

## 2022-06-27 ENCOUNTER — Other Ambulatory Visit: Payer: Self-pay | Admitting: Obstetrics & Gynecology

## 2022-06-27 DIAGNOSIS — N6310 Unspecified lump in the right breast, unspecified quadrant: Secondary | ICD-10-CM

## 2022-07-07 ENCOUNTER — Encounter (INDEPENDENT_AMBULATORY_CARE_PROVIDER_SITE_OTHER): Payer: Self-pay | Admitting: Primary Care

## 2022-07-07 ENCOUNTER — Ambulatory Visit: Payer: Medicaid Other | Admitting: Orthopedic Surgery

## 2022-07-07 ENCOUNTER — Ambulatory Visit (INDEPENDENT_AMBULATORY_CARE_PROVIDER_SITE_OTHER): Payer: Medicaid Other | Admitting: Primary Care

## 2022-07-07 VITALS — BP 112/78 | HR 86 | Resp 16 | Ht 62.0 in | Wt 178.0 lb

## 2022-07-07 DIAGNOSIS — H6121 Impacted cerumen, right ear: Secondary | ICD-10-CM

## 2022-07-07 DIAGNOSIS — E6609 Other obesity due to excess calories: Secondary | ICD-10-CM | POA: Diagnosis not present

## 2022-07-07 DIAGNOSIS — Z13220 Encounter for screening for lipoid disorders: Secondary | ICD-10-CM

## 2022-07-07 DIAGNOSIS — L709 Acne, unspecified: Secondary | ICD-10-CM

## 2022-07-07 DIAGNOSIS — Z7689 Persons encountering health services in other specified circumstances: Secondary | ICD-10-CM

## 2022-07-07 DIAGNOSIS — Z6834 Body mass index (BMI) 34.0-34.9, adult: Secondary | ICD-10-CM

## 2022-07-07 DIAGNOSIS — D649 Anemia, unspecified: Secondary | ICD-10-CM | POA: Diagnosis not present

## 2022-07-07 DIAGNOSIS — Z131 Encounter for screening for diabetes mellitus: Secondary | ICD-10-CM

## 2022-07-07 DIAGNOSIS — N946 Dysmenorrhea, unspecified: Secondary | ICD-10-CM | POA: Diagnosis not present

## 2022-07-07 NOTE — Progress Notes (Signed)
New Patient Office Visit  Subjective    Patient ID: [name], female    DOB: 1980-07-27  Age: 42 y.o. MRN: [id number]  CC:  Chief Complaint  Patient presents with   New Patient (Initial Visit)   Referral    Pt is requesting a referral to Dermatology    Weight Management Screening    Pt is requesting to restart Phentermine     HPI [name] is a 42 year old Hispanic female who is obese presents to establish care.  Her husband [name] was given permission to be at her appointment.  She is here today requesting a referral to dermatology she has been having acne breakouts for approximately a year.  Her skin becomes clear and then acne breakouts.  She does have irregular menstrual cycles and has a IUD for birth control. She has tried over-the-counter acne medication without good results.  She also is concerned about weight BMI is only 32.56. Patient has No headache, No chest pain, No abdominal pain - No Nausea, No new weakness tingling or numbness, No Cough - shortness of breath  Outpatient Encounter Medications as of 07/07/2022  Medication Sig   acetaminophen (TYLENOL) 325 MG tablet Take 2 tablets (650 mg total) by mouth every 6 (six) hours as needed for moderate pain. (Patient not taking: Reported on 07/07/2022)   cetirizine (ZYRTEC ALLERGY) 10 MG tablet Take 1 tablet (10 mg total) by mouth daily. (Patient not taking: Reported on 06/19/2022)   ibuprofen (ADVIL) 600 MG tablet Take 1 tablet (600 mg total) by mouth every 6 (six) hours as needed. (Patient not taking: Reported on 06/19/2022)   ipratropium (ATROVENT) 0.03 % nasal spray Place 2 sprays into both nostrils 2 (two) times daily. (Patient not taking: Reported on 06/19/2022)   MAGNESIUM PO Take by mouth. (Patient not taking: Reported on 07/07/2022)   Multiple Vitamin (MULTIVITAMIN) tablet Take 1 tablet by mouth daily. (Patient not taking: Reported on 07/07/2022)   promethazine-dextromethorphan (PROMETHAZINE-DM) 6.25-15 MG/5ML syrup Take  2.5 mLs by mouth 3 (three) times daily as needed for cough. (Patient not taking: Reported on 06/19/2022)   Vitamin D, Ergocalciferol, (DRISDOL) 1.25 MG (50000 UNIT) CAPS capsule Take 1 capsule (50,000 Units total) by mouth every 7 (seven) days. (Patient not taking: Reported on 07/07/2022)   No facility-administered encounter medications on file as of 07/07/2022.    No past medical history on file.  Past Surgical History:  Procedure Laterality Date   CESAREAN SECTION      Family History  Problem Relation Age of Onset   Breast cancer Mother 7       deceased 33   Cancer Father        prostate   Hypertension Father    Breast cancer Maternal Grandmother 79       deceased 6s    Social History   Socioeconomic History   Marital status: Married    Spouse name: Not on file   Number of children: Not on file   Years of education: Not on file   Highest education level: Not on file  Occupational History   Not on file  Tobacco Use   Smoking status: Former    Passive exposure: Never   Smokeless tobacco: Never  Vaping Use   Vaping Use: Never used  Substance and Sexual Activity   Alcohol use: Yes    Comment: socially   Drug use: No   Sexual activity: Yes    Partners: Male    Birth  control/protection: I.U.D.    Comment: 1si intercourse-21, partners- 5+, married- 58 yrs-Paragard inserted 2015  Other Topics Concern   Not on file  Social History Narrative   Not on file   Social Determinants of Health   Financial Resource Strain: Not on file  Food Insecurity: Not on file  Transportation Needs: Not on file  Physical Activity: Not on file  Stress: Not on file  Social Connections: Not on file  Intimate Partner Violence: Not on file    ROS Comprehensive ROS Pertinent positive and negative noted in HPI     Objective    Blood Pressure 112/78   Pulse 86   Respiration 16   Height 5\' 2"  (1.575 m)   Weight 178 lb (80.7 kg)   Oxygen Saturation 96%   Body Mass Index 32.56 kg/m    Physical exam: General: Vital signs reviewed.  Patient is well-developed and well-nourished, obese female  in no acute distress and cooperative with exam. Head: Normocephalic and atraumatic. Right ear impaction( uses q tips) Eyes: EOMI, conjunctivae normal, no scleral icterus. Neck: Supple, trachea midline, normal ROM, no JVD, masses, thyromegaly, or carotid bruit present. Cardiovascular: RRR, S1 normal, S2 normal, no murmurs, gallops, or rubs. Pulmonary/Chest: Clear to auscultation bilaterally, no wheezes, rales, or rhonchi. Abdominal: Soft, non-tender, non-distended, BS +, no masses, organomegaly, or guarding present. Musculoskeletal: No joint deformities, erythema, or stiffness, ROM full and nontender. Extremities: No lower extremity edema bilaterally,  pulses symmetric and intact bilaterally. No cyanosis or clubbing. Neurological: A&O x3, Strength is normal Skin: Warm, dry and intact. Acnes bilateral checks Psychiatric: Normal mood and affect. speech and behavior is normal. Cognition and memory are normal.    Assessment & Plan:  Jeanell was seen today for new patient (initial visit), referral and weight management screening.  Diagnoses and all orders for this visit:  Anemia, unspecified type -     CBC with Differential/Platelet  Screening for diabetes mellitus -     Hemoglobin A1c  Lipid screening -     Lipid panel  Dysmenorrhea IUD- -     CBC with Differential/Platelet -     CMP14+EGFR  Acne, unspecified acne type -     Ambulatory referral to Dermatology  Encounter to establish care    Hearing loss of right ear due to cerumen impaction  Rtn for a flush  Class 1 obesity due to excess calories without serious comorbidity with body mass index (BMI) of 34.0 to 34.9 in adult  Obesity is 30-39 indicating an excess in caloric intake or underlining conditions. This may lead to other co-morbidities. Educated on lifestyle modifications of diet and exercise which may reduce  obesity.    Kerin Perna, NP

## 2022-07-07 NOTE — Patient Instructions (Signed)
Recuento de caloras para bajar de peso Calorie Counting for Weight Loss Las caloras son unidades de energa. El cuerpo necesita una cierta cantidad de caloras de los alimentos para que lo ayuden a funcionar durante todo el da. Cuando se comen o beben ms caloras de las que el cuerpo necesita, este acumula las caloras adicionales mayormente como grasa. Cuando se comen o beben menos caloras de las que el cuerpo necesita, este quema grasa para obtener la energa que necesita. El recuento de caloras es el registro de la cantidad de caloras que se comen y beben cada da. El recuento de caloras puede ser de ayuda si necesita perder peso. Si come menos caloras de las que el cuerpo necesita, debera bajar de peso. Pregntele al mdico cul es un peso sano para usted. Para que el recuento de caloras funcione, usted tendr que ingerir la cantidad de caloras adecuadas cada da, para bajar una cantidad de peso saludable por semana. Un nutricionista puede ayudar a determinar la cantidad de caloras que usted necesita por da y sugerirle formas de alcanzar su objetivo calrico. Una cantidad de peso saludable para bajar cada semana suele ser entre 1 y 2 libras (0.5 a 0.9 kg). Esto habitualmente significa que su ingesta diaria de caloras se debera reducir en unas 500 a 750 caloras. Ingerir de 1200 a 1500 caloras por da puede ayudar a la mayora de las mujeres a bajar de peso. Ingerir de 1500 a 1800 caloras por da puede ayudar a la mayora de los hombres a bajar de peso. Qu debo saber acerca del recuento de caloras? Trabaje con el mdico o el nutricionista para determinar cuntas caloras debe recibir cada da. A fin de alcanzar su objetivo diario de caloras, tendr que: Averiguar cuntas caloras hay en cada alimento que le gustara comer. Intente hacerlo antes de comer. Decidir la cantidad que puede comer del alimento. Llevar un registro de los alimentos. Para esto, anote lo que comi y cuntas  caloras tena. Para perder peso con xito, es importante equilibrar el recuento de caloras con un estilo de vida saludable que incluya actividad fsica de forma regular. Dnde encuentro informacin sobre las caloras?  Es posible encontrar la cantidad de caloras que contiene un alimento en la etiqueta de informacin nutricional. Si un alimento no tiene una etiqueta de informacin nutricional, intente buscar las caloras en Internet o pida ayuda al nutricionista. Recuerde que las caloras se calculan por porcin. Si opta por comer ms de una porcin de un alimento, tendr que multiplicar las caloras de una porcin por la cantidad de porciones que planea comer. Por ejemplo, la etiqueta de un envase de pan puede decir que el tamao de una porcin es 1 rodaja, y que una porcin tiene 90 caloras. Si come 1 rodaja, habr comido 90 caloras. Si come 2 rodajas, habr comido 180 caloras. Cmo llevo un registro de comidas? Despus de cada vez que coma, anote lo siguiente en el registro de alimentos lo antes posible: Lo que comi. Asegrese de incluir los aderezos, las salsas y otros extras en los alimentos. La cantidad que comi. Esto se puede medir en tazas, onzas o cantidad de alimentos. Cuntas caloras haba en cada alimento y en cada bebida. La cantidad total de caloras en la comida que tom. Tenga a mano el registro de alimentos, por ejemplo, en un anotador de bolsillo o utilice una aplicacin o sitio web en el telfono mvil. Algunos programas calcularn las caloras por usted y le mostrarn la cantidad de   caloras que le quedan para llegar al objetivo diario. Cules son algunos consejos para controlar las porciones? Sepa cuntas caloras hay en una porcin. Esto lo ayudar a saber cuntas porciones de un alimento determinado puede comer. Use una taza medidora para medir los tamaos de las porciones. Tambin puede intentar pesar las porciones en una balanza de cocina. Con el tiempo, podr hacer  un clculo estimativo de los tamaos de las porciones de algunos alimentos. Dedique tiempo a poner porciones de diferentes alimentos en sus platos, tazones y tazas predilectos, a fin de saber cmo se ve una porcin. Intente no comer directamente de un envase de alimentos, por ejemplo, de una bolsa o una caja. Comer directamente del envase dificulta ver cunto est comiendo y puede conducir a comer en exceso. Ponga la cantidad que le gustara comer en una taza o un plato, a fin de asegurarse de que est comiendo la porcin correcta. Use platos, vasos y tazones ms pequeos para medir porciones ms pequeas y evitar no comer en exceso. Intente no realizar varias tareas al mismo tiempo. Por ejemplo, evite mirar televisin o usar la computadora mientras come. Si es la hora de comer, sintese a la mesa y disfrute de la comida. Esto lo ayudar a reconocer cundo est satisfecho. Tambin le permitir estar ms consciente de qu come y cunto come. Consejos para seguir este plan Al leer las etiquetas de los alimentos Controle el recuento de caloras en comparacin con el tamao de la porcin. El tamao de la porcin puede ser ms pequeo de lo que suele comer. Verifique la fuente de las caloras. Intente elegir alimentos ricos en protenas, fibras y vitaminas, y bajos en grasas saturadas, grasas trans y sodio. Al ir de compras Lea las etiquetas nutricionales cuando compre. Esto lo ayudar a tomar decisiones saludables sobre qu alimentos comprar. Preste atencin a las etiquetas nutricionales de alimentos bajos en grasas o sin grasas. Estos alimentos a veces tienen la misma cantidad de caloras o ms caloras que las versiones ricas en grasas. Con frecuencia, tambin tienen agregados de azcar, almidn o sal, para darles el sabor que fue eliminado con las grasas. Haga una lista de compras con los alimentos que tienen un menor contenido de caloras y resptela. Al cocinar Intente cocinar sus alimentos preferidos  de una manera ms saludable. Por ejemplo, pruebe hornear en vez de frer. Utilice productos lcteos descremados. Planificacin de las comidas Utilice ms frutas y verduras. La mitad de su plato debe ser de frutas y verduras. Incluya protenas magras, como pollo, pavo y pescado. Estilo de vida Cada semana, trate de hacer una de las siguientes cosas: 150 minutos de ejercicio moderado, como caminar. 75 minutos de ejercicio enrgico, como correr. Informacin general Sepa cuntas caloras tienen los alimentos que come con ms frecuencia. Esto le ayudar a contar las caloras ms rpidamente. Encuentre un mtodo para controlar las caloras que funcione para usted. Sea creativo. Pruebe aplicaciones o programas distintos, si llevar un registro de las caloras no funciona para usted. Qu alimentos debo consumir?  Consuma alimentos nutritivos. Es mejor comer un alimento nutritivo, de alto contenido calrico, como un aguacate, que uno con pocos nutrientes, como una bolsa de patatas fritas. Use sus caloras en alimentos y bebidas que lo sacien y no lo dejen con apetito apenas termina de comer. Ejemplos de alimentos que lo sacian son los frutos secos y mantequillas de frutos secos, verduras, protenas magras y alimentos con alto contenido de fibra como los cereales integrales. Los alimentos con alto   contenido de fibra son aquellos que tienen ms de 5 g de fibra por porcin. Preste atencin a las caloras en las bebidas. Las bebidas de bajas caloras incluyen agua y refrescos sin azcar. Es posible que los productos que se enumeran ms arriba no constituyan una lista completa de los alimentos y las bebidas que puede tomar. Consulte a un nutricionista para obtener ms informacin. Qu alimentos debo limitar? Limite el consumo de alimentos o bebidas que no sean buenas fuentes de vitaminas, minerales o protenas, o que tengan alto contenido de grasas no saludables. Estos incluyen: Caramelos. Otros  dulces. Refrescos, bebidas con caf especiales, alcohol y jugo. Es posible que los productos que se enumeran ms arriba no constituyan una lista completa de los alimentos y las bebidas que debe evitar. Consulte a un nutricionista para obtener ms informacin. Cmo puedo hacer el recuento de caloras cuando como afuera? Preste atencin a las porciones. A menudo, las porciones son mucho ms grandes al comer afuera. Pruebe con estos consejos para mantener las porciones ms pequeas: Considere la posibilidad de compartir una comida en lugar de tomarla toda usted solo. Si pide su propia comida, coma solo la mitad. Antes de empezar a comer, pida un recipiente y ponga la mitad de la comida en l. Cuando sea posible, considere la posibilidad de pedir porciones ms pequeas del men en lugar de porciones completas. Preste atencin a la eleccin de alimentos y bebidas. Saber la forma en que se cocinan los alimentos y lo que incluye la comida puede ayudarlo a ingerir menos caloras. Si se detallan las caloras en el men, elija las opciones que contengan la menor cantidad. Elija platos que incluyan verduras, frutas, cereales integrales, productos lcteos con bajo contenido de grasa y protenas magras. Opte por los alimentos hervidos, asados, cocidos a la parrilla o al vapor. Evite los alimentos a los que se les ponga mantequilla, que estn empanados o fritos, o que se sirvan con salsa a base de crema. Generalmente, los alimentos que se etiquetan como "crujientes" estn fritos, a menos que se indique lo contrario. Elija el agua, la leche descremada, el t helado sin azcar u otras bebidas que no contengan azcares agregados. Si desea una bebida alcohlica, escoja una opcin con menos caloras, como una copa de vino o una cerveza ligera. Ordene los aderezos, las salsas y los jarabes aparte. Estos son, con frecuencia, de alto contenido en caloras, por lo que debe limitar la cantidad que ingiere. Si desea una  ensalada, elija una de hortalizas y pida carnes a la parrilla. Evite las guarniciones adicionales como el tocino, el queso o los alimentos fritos. Ordene el aderezo aparte o pida aceite de oliva y vinagre o limn para aderezar. Haga un clculo estimativo de la cantidad de porciones que le sirven. Conocer el tamao de las porciones lo ayudar a estar atento a la cantidad de comida que come en los restaurantes. Dnde buscar ms informacin Centers for Disease Control and Prevention (Centros para el Control y la Prevencin de Enfermedades): www.cdc.gov [place] Department of Agriculture (Departamento de Agricultura de los [place]): myplate.gov Resumen El recuento de caloras es el registro de la cantidad de caloras que se comen y beben cada da. Si come menos caloras de las que el cuerpo necesita, debera bajar de peso. Una cantidad de peso saludable para bajar por semana suele ser entre 1 y 2 libras (0.5 a 0.9 kg). Esto significa, con frecuencia, reducir su ingesta diaria de caloras unas 500 a 750 caloras. Es posible   encontrar la cantidad de caloras que contiene un alimento en la etiqueta de informacin nutricional. Si un alimento no tiene una etiqueta de informacin nutricional, intente buscar las caloras en Internet o pida ayuda al nutricionista. Use platos, vasos y tazones ms pequeos para medir porciones ms pequeas y evitar no comer en exceso. Use sus caloras en alimentos y bebidas que lo sacien y no lo dejen con apetito poco tiempo despus de haber comido. Esta informacin no tiene como fin reemplazar el consejo del mdico. Asegrese de hacerle al mdico cualquier pregunta que tenga. Document Revised: 07/18/2019 Document Reviewed: 07/18/2019 Elsevier Patient Education  2023 Elsevier Inc.  

## 2022-07-08 LAB — CBC WITH DIFFERENTIAL/PLATELET
Basophils Absolute: 0 10*3/uL (ref 0.0–0.2)
Basos: 1 %
EOS (ABSOLUTE): 0.2 10*3/uL (ref 0.0–0.4)
Eos: 3 %
Hematocrit: 38.5 % (ref 34.0–46.6)
Hemoglobin: 12.7 g/dL (ref 11.1–15.9)
Immature Grans (Abs): 0 10*3/uL (ref 0.0–0.1)
Immature Granulocytes: 0 %
Lymphocytes Absolute: 1.5 10*3/uL (ref 0.7–3.1)
Lymphs: 27 %
MCH: 30.5 pg (ref 26.6–33.0)
MCHC: 33 g/dL (ref 31.5–35.7)
MCV: 92 fL (ref 79–97)
Monocytes Absolute: 0.4 10*3/uL (ref 0.1–0.9)
Monocytes: 8 %
Neutrophils Absolute: 3.2 10*3/uL (ref 1.4–7.0)
Neutrophils: 61 %
Platelets: 237 10*3/uL (ref 150–450)
RBC: 4.17 x10E6/uL (ref 3.77–5.28)
RDW: 12.9 % (ref 11.7–15.4)
WBC: 5.3 10*3/uL (ref 3.4–10.8)

## 2022-07-08 LAB — CMP14+EGFR
ALT: 18 IU/L (ref 0–32)
AST: 13 IU/L (ref 0–40)
Albumin/Globulin Ratio: 1.8 (ref 1.2–2.2)
Albumin: 4.3 g/dL (ref 3.9–4.9)
Alkaline Phosphatase: 62 IU/L (ref 44–121)
BUN/Creatinine Ratio: 16 (ref 9–23)
BUN: 9 mg/dL (ref 6–20)
Bilirubin Total: 0.4 mg/dL (ref 0.0–1.2)
CO2: 21 mmol/L (ref 20–29)
Calcium: 9.1 mg/dL (ref 8.7–10.2)
Chloride: 103 mmol/L (ref 96–106)
Creatinine, Ser: 0.58 mg/dL (ref 0.57–1.00)
Globulin, Total: 2.4 g/dL (ref 1.5–4.5)
Glucose: 109 mg/dL — ABNORMAL HIGH (ref 70–99)
Potassium: 4 mmol/L (ref 3.5–5.2)
Sodium: 139 mmol/L (ref 134–144)
Total Protein: 6.7 g/dL (ref 6.0–8.5)
eGFR: 118 mL/min/{1.73_m2} (ref >59)

## 2022-07-08 LAB — HEMOGLOBIN A1C
Est. average glucose Bld gHb Est-mCnc: 120 mg/dL
Hgb A1c MFr Bld: 5.8 % — ABNORMAL HIGH (ref 4.8–5.6)

## 2022-07-08 LAB — LIPID PANEL
Chol/HDL Ratio: 3.9 ratio (ref 0.0–4.4)
Cholesterol, Total: 176 mg/dL (ref 100–199)
HDL: 45 mg/dL (ref >39)
LDL Chol Calc (NIH): 70 mg/dL (ref 0–99)
Triglycerides: 387 mg/dL — ABNORMAL HIGH (ref 0–149)
VLDL Cholesterol Cal: 61 mg/dL — ABNORMAL HIGH (ref 5–40)

## 2022-07-08 LAB — TSH+FREE T4
Free T4: 1.29 ng/dL (ref 0.82–1.77)
TSH: 1.88 u[IU]/mL (ref 0.450–4.500)

## 2022-07-11 ENCOUNTER — Ambulatory Visit (INDEPENDENT_AMBULATORY_CARE_PROVIDER_SITE_OTHER): Payer: Medicaid Other

## 2022-07-11 DIAGNOSIS — E781 Pure hyperglyceridemia: Secondary | ICD-10-CM

## 2022-07-11 MED ORDER — GEMFIBROZIL 600 MG PO TABS: 600.0000 mg | ORAL_TABLET | Freq: Two times a day (BID) | ORAL | 1 refills | Status: DC

## 2022-07-14 ENCOUNTER — Ambulatory Visit (INDEPENDENT_AMBULATORY_CARE_PROVIDER_SITE_OTHER): Payer: Medicaid Other

## 2022-07-31 ENCOUNTER — Encounter (INDEPENDENT_AMBULATORY_CARE_PROVIDER_SITE_OTHER): Payer: Self-pay | Admitting: Primary Care

## 2022-08-18 ENCOUNTER — Ambulatory Visit
Admission: RE | Admit: 2022-08-18 | Discharge: 2022-08-18 | Disposition: A | Discharge status: Home / Self Care | Payer: Medicaid Other | Source: Ambulatory Visit | Attending: Obstetrics & Gynecology | Admitting: Obstetrics & Gynecology

## 2022-08-18 DIAGNOSIS — N6313 Unspecified lump in the right breast, lower outer quadrant: Secondary | ICD-10-CM | POA: Diagnosis not present

## 2022-08-18 DIAGNOSIS — N6310 Unspecified lump in the right breast, unspecified quadrant: Secondary | ICD-10-CM

## 2022-09-05 ENCOUNTER — Ambulatory Visit (INDEPENDENT_AMBULATORY_CARE_PROVIDER_SITE_OTHER): Payer: Medicaid Other | Admitting: Obstetrics & Gynecology

## 2022-09-05 ENCOUNTER — Encounter: Payer: Self-pay | Admitting: Obstetrics & Gynecology

## 2022-09-05 ENCOUNTER — Other Ambulatory Visit (HOSPITAL_COMMUNITY)
Admission: RE | Admit: 2022-09-05 | Discharge: 2022-09-05 | Disposition: A | Discharge status: Home / Self Care | Payer: Medicaid Other | Source: Ambulatory Visit | Attending: Obstetrics & Gynecology | Admitting: Obstetrics & Gynecology

## 2022-09-05 VITALS — BP 120/82 | HR 98 | Ht 61.75 in | Wt 171.0 lb

## 2022-09-05 DIAGNOSIS — E6609 Other obesity due to excess calories: Secondary | ICD-10-CM

## 2022-09-05 DIAGNOSIS — Z803 Family history of malignant neoplasm of breast: Secondary | ICD-10-CM | POA: Diagnosis not present

## 2022-09-05 DIAGNOSIS — Z01419 Encounter for gynecological examination (general) (routine) without abnormal findings: Secondary | ICD-10-CM | POA: Insufficient documentation

## 2022-09-05 DIAGNOSIS — Z975 Presence of (intrauterine) contraceptive device: Secondary | ICD-10-CM | POA: Diagnosis not present

## 2022-09-05 MED ORDER — PHENTERMINE HCL 37.5 MG PO TABS: 37.5000 mg | ORAL_TABLET | Freq: Every day | ORAL | 2 refills | Status: DC

## 2022-09-05 NOTE — Progress Notes (Signed)
SHYLEIGH IBRAHIM Jul 23, 1980 [id number]   History:    42 y.o.  G1P1L1  Married   RP:  Established patient presenting for annual gyn exam   HPI:  Menses normal every month.  Well on Paragard IUD x 12/2013. No pelvic pain.  Normal secretions.  Pap Neg Aug 25, 2021.  No h/o abnormal Pap. Pap/HPV, Gono-Chlam today.  Breasts normal.  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  Mother deceased from Breast Ca Dxed at 38 yo. Patient had genetic testing which was negative. 20% lifetime risk of Breast Ca, recommend Bilateral Breast MRI annually.  Mictions/BMs wnl.  BMI 31.53. Fighting weight gain.  Intermittent fasting.  Only able to loose weight when taking Phentermine.    Past medical history,surgical history, family history and social history were all reviewed and documented in the EPIC chart.  Gynecologic History Patient's last menstrual period was 08/19/2022 (exact date).  Obstetric History OB History  Gravida Para Term Preterm AB Living  1 1 1     1   SAB IAB Ectopic Multiple Live Births               # Outcome Date GA Lbr Len/2nd Weight Sex Delivery Anes PTL Lv  1 Term              ROS: A ROS was performed and pertinent positives and negatives are included in the history. GENERAL: No fevers or chills. HEENT: No change in vision, no earache, sore throat or sinus congestion. NECK: No pain or stiffness. CARDIOVASCULAR: No chest pain or pressure. No palpitations. PULMONARY: No shortness of breath, cough or wheeze. GASTROINTESTINAL: No abdominal pain, nausea, vomiting or diarrhea, melena or bright red blood per rectum. GENITOURINARY: No urinary frequency, urgency, hesitancy or dysuria. MUSCULOSKELETAL: No joint or muscle pain, no back pain, no recent trauma. DERMATOLOGIC: No rash, no itching, no lesions. ENDOCRINE: No polyuria, polydipsia, no heat or cold intolerance. No recent change in weight. HEMATOLOGICAL: No anemia or easy bruising or bleeding. NEUROLOGIC: No  headache, seizures, numbness, tingling or weakness. PSYCHIATRIC: No depression, no loss of interest in normal activity or change in sleep pattern.     Exam:   BP 120/82   Pulse 98   Ht 5' 1.75" (1.568 m)   Wt 171 lb (77.6 kg)   LMP 08/19/2022 (Exact Date) Comment: paragard inserted 9/15, sexually active  SpO2 99%   BMI 31.53 kg/m   Body mass index is 31.53 kg/m.  General appearance : Well developed well nourished female. No acute distress HEENT: Eyes: no retinal hemorrhage or exudates,  Neck supple, trachea midline, no carotid bruits, no thyroidmegaly Lungs: Clear to auscultation, no rhonchi or wheezes, or rib retractions  Heart: Regular rate and rhythm, no murmurs or gallops Breast:Examined in sitting and supine position were symmetrical in appearance, no palpable masses or tenderness,  no skin retraction, no nipple inversion, no nipple discharge, no skin discoloration, no axillary or supraclavicular lymphadenopathy Abdomen: no palpable masses or tenderness, no rebound or guarding Extremities: no edema or skin discoloration or tenderness  Pelvic: Vulva: Normal             Vagina: No gross lesions or discharge  Cervix: No gross lesions or discharge.  IUD strings visible at [facility]. Pap/HPV HR/Gono-Chlam done.  Uterus  AV, normal size, shape and consistency, non-tender and mobile  Adnexa  Without masses or tenderness  Anus: Normal   Assessment/Plan:  42 y.o. female for annual exam  1. Encounter for routine gynecological examination with Papanicolaou smear of cervix Menses normal every month.  Well on Paragard IUD x 12/2013. No pelvic pain.  Normal secretions.  Pap Neg 09-06-2021.  No h/o abnormal Pap. Pap/HPV, Gono-Chlam today.  Breasts normal.  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  Mother deceased from Breast Ca Dxed at 51 yo. Patient had genetic testing which was negative. 20% lifetime risk of Breast Ca, recommend Bilateral Breast MRI annually.   Mictions/BMs wnl.  BMI 31.53. Fighting weight gain.  Intermittent fasting.  Only able to loose weight when taking Phentermine.  - Cytology - PAP( [place])   2. IUD (intrauterine device) in place Well on Paragard IUD x 12/2013. No pelvic pain.  Normal secretions.  IUD in good position.  Time to change in 12/2023.  3. Family history of breast cancer in mother MRI of breasts annually.  4. Class 1 obesity due to excess calories without serious comorbidity with body mass index (BMI) of 31.0 to 31.9 in adult BMI 31.53. Fighting weight gain.  Intermittent fasting.  Only able to loose weight when taking Phentermine. Prescription sent to pharmacy.  Other orders - Multiple Vitamin (MULTIVITAMIN PO); Take by mouth. - Cholecalciferol (VITAMIN D-3 PO); Take by mouth. - MAGNESIUM PO; Take by mouth. - Cyanocobalamin (B-12 PO); Take by mouth. - phentermine (ADIPEX-P) 37.5 MG tablet; Take 1 tablet (37.5 mg total) by mouth daily before breakfast.   Genia Del MD, 2:48 PM

## 2022-09-11 LAB — CYTOLOGY - PAP
Chlamydia: NEGATIVE
Comment: NEGATIVE
Comment: NEGATIVE
Comment: NORMAL
Diagnosis: NEGATIVE
Diagnosis: REACTIVE
High risk HPV: NEGATIVE
Neisseria Gonorrhea: NEGATIVE

## 2022-11-10 ENCOUNTER — Ambulatory Visit: Payer: Medicaid Other | Admitting: Dermatology

## 2022-12-25 ENCOUNTER — Ambulatory Visit: Payer: Medicaid Other | Admitting: Dermatology

## 2022-12-25 DIAGNOSIS — Z7189 Other specified counseling: Secondary | ICD-10-CM

## 2022-12-25 DIAGNOSIS — Z79899 Other long term (current) drug therapy: Secondary | ICD-10-CM

## 2022-12-25 DIAGNOSIS — L700 Acne vulgaris: Secondary | ICD-10-CM

## 2022-12-25 MED ORDER — TRETINOIN 0.025 % EX CREA: TOPICAL_CREAM | CUTANEOUS | 3 refills | Status: DC

## 2022-12-25 MED ORDER — DOXYCYCLINE MONOHYDRATE 50 MG PO TABS: ORAL_TABLET | ORAL | 0 refills | Status: DC

## 2022-12-25 MED ORDER — SPIRONOLACTONE 25 MG PO TABS: ORAL_TABLET | ORAL | 0 refills | Status: DC

## 2022-12-25 NOTE — Progress Notes (Signed)
   New Patient Visit   Subjective  [name] is a 42 y.o. female who presents for the following: Acne Vulgaris - started two years, flares at times, hasn't noticed a correlation between menses and flares. Currently using OTC La Roche Posay serum, but hasn't noticed an improvement.   Accompanied by husband who contributes to history.   The following portions of the chart were reviewed this encounter and updated as appropriate: medications, allergies, medical history  Review of Systems:  No other skin or systemic complaints except as noted in HPI or Assessment and Plan.  Objective  Well appearing patient in no apparent distress; mood and affect are within normal limits.  Areas Examined: Face, chest and back  Relevant exam findings are noted in the Assessment and Plan.   Assessment & Plan          ACNE VULGARIS - pt has an IUD x 10 years, not planning on becoming pregnant   Exam: Comedones, papules, and pustules of the face.  Chronic and persistent condition with duration or expected duration over one year. Condition is symptomatic/ bothersome to patient. Not currently at goal.  Treatment Plan: Start Spironolactone 25 mg po QAM. Spironolactone can cause increased urination and cause blood pressure to decrease. Please watch for signs of lightheadedness and be cautious when changing position. It can sometimes cause breast tenderness or an irregular period in premenopausal women. It can also increase potassium. The increase in potassium usually is not a concern unless you are taking other medicines that also increase potassium, so please be sure your doctor knows all of the other medications you are taking. This medication should not be taken by pregnant women.  This medicine should also not be taken together with sulfa drugs like Bactrim (trimethoprim/sulfamethexazole).   Start Doxycycline 50 mg po QHS with dinner meal. Doxycycline should be taken with food to prevent nausea. Do  not lay down for 30 minutes after taking. Be cautious with sun exposure and use good sun protection while on this medication. Pregnant women should not take this medication.   Start Tretinoin 0.025% cream QHS. Topical retinoid medications like tretinoin/Retin-A, adapalene/Differin, tazarotene/Fabior, and Epiduo/Epiduo Forte can cause dryness and irritation when first started. Only apply a pea-sized amount to the entire affected area. Avoid applying it around the eyes, edges of mouth and creases at the nose. If you experience irritation, use a good moisturizer first and/or apply the medicine less often. If you are doing well with the medicine, you can increase how often you use it until you are applying every night. Be careful with sun protection while using this medication as it can make you sensitive to the sun. This medicine should not be used by pregnant women.   Discussed Isotretinoin.  Patient to contact office in 6 weeks if not improving.   Return in about 3 months (around 03/26/2023) for acne follow up.  Maylene Roes, CMA, am acting as scribe for Armida Sans, MD .  Documentation: I have reviewed the above documentation for accuracy and completeness, and I agree with the above.  Armida Sans, MD

## 2022-12-25 NOTE — Patient Instructions (Addendum)
Start Spironolactone 25 mg take one tab by mouth in the morning. Spironolactone can cause increased urination and cause blood pressure to decrease. Please watch for signs of lightheadedness and be cautious when changing position. It can sometimes cause breast tenderness or an irregular period in premenopausal women. It can also increase potassium. The increase in potassium usually is not a concern unless you are taking other medicines that also increase potassium, so please be sure your doctor knows all of the other medications you are taking. This medication should not be taken by pregnant women.  This medicine should also not be taken together with sulfa drugs like Bactrim (trimethoprim/sulfamethexazole).   Start Doxycycline 50 mg take one cap by mouth with dinner meal. Doxycycline should be taken with food to prevent nausea. Do not lay down for 30 minutes after taking. Be cautious with sun exposure and use good sun protection while on this medication. Pregnant women should not take this medication.   Start Tretinoin 0.025% cream apply a pea sized amount to the entire face at night. Topical retinoid medications like tretinoin/Retin-A, adapalene/Differin, tazarotene/Fabior, and Epiduo/Epiduo Forte can cause dryness and irritation when first started. Only apply a pea-sized amount to the entire affected area. Avoid applying it around the eyes, edges of mouth and creases at the nose. If you experience irritation, use a good moisturizer first and/or apply the medicine less often. If you are doing well with the medicine, you can increase how often you use it until you are applying every night. Be careful with sun protection while using this medication as it can make you sensitive to the sun. This medicine should not be used by pregnant women.   Due to recent changes in healthcare laws, you may see results of your pathology and/or laboratory studies on MyChart before the doctors have had a chance to review them. We  understand that in some cases there may be results that are confusing or concerning to you. Please understand that not all results are received at the same time and often the doctors may need to interpret multiple results in order to provide you with the best plan of care or course of treatment. Therefore, we ask that you please give [place] 2 business days to thoroughly review all your results before contacting the office for clarification. Should we see a critical lab result, you will be contacted sooner.   If You Need Anything After Your Visit  If you have any questions or concerns for your doctor, please call our main line at [phone] and press option 4 to reach your doctor's medical assistant. If no one answers, please leave a voicemail as directed and we will return your call as soon as possible. Messages left after 4 pm will be answered the following business day.   You may also send [place] a message via MyChart. We typically respond to MyChart messages within 1-2 business days.  For prescription refills, please ask your pharmacy to contact our office. Our fax number is [phone].  If you have an urgent issue when the clinic is closed that cannot wait until the next business day, you can page your doctor at the number below.    Please note that while we do our best to be available for urgent issues outside of office hours, we are not available 24/7.   If you have an urgent issue and are unable to reach [place], you may choose to seek medical care at your doctor's office, retail clinic, urgent care center,  or emergency room.  If you have a medical emergency, please immediately call 911 or go to the emergency department.  Pager Numbers  - Dr. Gwen Pounds: [phone]  - Dr. Roseanne Reno: [phone]  - Dr. Katrinka Blazing: [phone]   In the event of inclement weather, please call our main line at [phone] for an update on the status of any delays or closures.  Dermatology Medication Tips: Please  keep the boxes that topical medications come in in order to help keep track of the instructions about where and how to use these. Pharmacies typically print the medication instructions only on the boxes and not directly on the medication tubes.   If your medication is too expensive, please contact our office at [phone] option 4 or send [place] a message through MyChart.   We are unable to tell what your co-pay for medications will be in advance as this is different depending on your insurance coverage. However, we may be able to find a substitute medication at lower cost or fill out paperwork to get insurance to cover a needed medication.   If a prior authorization is required to get your medication covered by your insurance company, please allow [place] 1-2 business days to complete this process.  Drug prices often vary depending on where the prescription is filled and some pharmacies may offer cheaper prices.  The website www.goodrx.com contains coupons for medications through different pharmacies. The prices here do not account for what the cost may be with help from insurance (it may be cheaper with your insurance), but the website can give you the price if you did not use any insurance.  - You can print the associated coupon and take it with your prescription to the pharmacy.  - You may also stop by our office during regular business hours and pick up a GoodRx coupon card.  - If you need your prescription sent electronically to a different pharmacy, notify our office through [facility] or by phone at [phone] option 4.     Si Usted Necesita Algo Despus de Su Visita  Tambin puede enviarnos un mensaje a travs de Clinical cytogeneticist. Por lo general respondemos a los mensajes de MyChart en el transcurso de 1 a 2 das hbiles.  Para renovar recetas, por favor pida a su farmacia que se ponga en contacto con nuestra oficina. Annie Sable de fax es [place] [phone].  Si tiene un asunto urgente  cuando la clnica est cerrada y que no puede esperar hasta el siguiente da hbil, puede llamar/localizar a su doctor(a) al nmero que aparece a continuacin.   Por favor, tenga en cuenta que aunque hacemos todo lo posible para estar disponibles para asuntos urgentes fuera del horario de [place], no estamos disponibles las 24 horas del da, los 7 [address]  [address] de la [place].   Si tiene un problema urgente y no puede comunicarse con nosotros, puede optar por buscar atencin mdica  en el consultorio de su doctor(a), en una clnica privada, en un centro de atencin urgente o en una sala de emergencias.  Si tiene Engineer, drilling, por favor llame inmediatamente al 911 o vaya a la sala de emergencias.  Nmeros de bper  - Dr. Gwen Pounds: [phone]  - Dra. Roseanne Reno: [phone]  - Dr. Katrinka Blazing: [phone]   En caso de inclemencias del tiempo, por favor llame a Lacy Duverney principal al [phone] para una actualizacin sobre el [place] de cualquier retraso o cierre.  Consejos para la medicacin en dermatologa: Por favor, guarde las cajas en  las que vienen los medicamentos de uso tpico para ayudarle a seguir las instrucciones sobre dnde y cmo usarlos. Las farmacias generalmente imprimen las instrucciones del medicamento slo en las cajas y no directamente en los tubos del [place].   Si su medicamento es muy caro, por favor, pngase en contacto con Rolm Gala llamando al [phone] y presione la opcin 4 o envenos un mensaje a travs de Clinical cytogeneticist.   No podemos decirle cul ser su copago por los medicamentos por adelantado ya que esto es diferente dependiendo de la cobertura de su seguro. Sin embargo, es posible que podamos encontrar un medicamento sustituto a Audiological scientist un formulario para que el seguro cubra el medicamento que se considera necesario.   Si se requiere una autorizacin previa para que su compaa de seguros [place] su medicamento, por favor permtanos de 1 a 2  das hbiles para completar [address]  Los precios de los medicamentos varan con frecuencia dependiendo del Environmental consultant de dnde se surte la receta y alguna farmacias pueden ofrecer precios ms baratos.  El sitio web www.goodrx.com tiene cupones para medicamentos de Health and safety inspector. Los precios aqu no tienen en cuenta lo que podra costar con la ayuda del seguro (puede ser ms barato con su seguro), pero el sitio web puede darle el precio si no utiliz Tourist information centre manager.  - Puede imprimir el cupn correspondiente y llevarlo con su receta a la farmacia.  - Tambin puede pasar por nuestra oficina durante el horario de atencin regular y Education officer, museum una tarjeta de cupones de GoodRx.  - Si necesita que su receta se enve electrnicamente a una farmacia diferente, informe a nuestra oficina a travs de MyChart de [place] o por telfono llamando al [phone] y presione la opcin 4.

## 2022-12-30 ENCOUNTER — Encounter: Payer: Self-pay | Admitting: Dermatology

## 2023-01-12 ENCOUNTER — Other Ambulatory Visit: Payer: Self-pay | Admitting: Obstetrics & Gynecology

## 2023-01-12 DIAGNOSIS — Z1239 Encounter for other screening for malignant neoplasm of breast: Secondary | ICD-10-CM

## 2023-03-26 ENCOUNTER — Other Ambulatory Visit: Payer: Self-pay

## 2023-03-26 MED ORDER — SPIRONOLACTONE 25 MG PO TABS: ORAL_TABLET | ORAL | 0 refills | Status: DC

## 2023-03-26 MED ORDER — DOXYCYCLINE MONOHYDRATE 50 MG PO TABS: ORAL_TABLET | ORAL | 0 refills | Status: AC

## 2023-03-26 NOTE — Progress Notes (Signed)
Patient needing rf of medications due to being out this week.

## 2023-04-08 DIAGNOSIS — Z9189 Other specified personal risk factors, not elsewhere classified: Secondary | ICD-10-CM

## 2023-04-08 HISTORY — DX: Other specified personal risk factors, not elsewhere classified: Z91.89

## 2023-04-13 ENCOUNTER — Other Ambulatory Visit: Payer: Self-pay | Admitting: Obstetrics and Gynecology

## 2023-04-13 DIAGNOSIS — Z1239 Encounter for other screening for malignant neoplasm of breast: Secondary | ICD-10-CM

## 2023-04-16 ENCOUNTER — Ambulatory Visit: Payer: Medicaid Other | Admitting: Dermatology

## 2023-04-22 ENCOUNTER — Ambulatory Visit: Payer: Medicaid Other | Admitting: Dermatology

## 2023-04-22 DIAGNOSIS — D2239 Melanocytic nevi of other parts of face: Secondary | ICD-10-CM

## 2023-04-22 DIAGNOSIS — L918 Other hypertrophic disorders of the skin: Secondary | ICD-10-CM | POA: Diagnosis not present

## 2023-04-22 DIAGNOSIS — L738 Other specified follicular disorders: Secondary | ICD-10-CM

## 2023-04-22 DIAGNOSIS — Z79899 Other long term (current) drug therapy: Secondary | ICD-10-CM

## 2023-04-22 DIAGNOSIS — L700 Acne vulgaris: Secondary | ICD-10-CM

## 2023-04-22 DIAGNOSIS — R238 Other skin changes: Secondary | ICD-10-CM

## 2023-04-22 DIAGNOSIS — D229 Melanocytic nevi, unspecified: Secondary | ICD-10-CM

## 2023-04-22 MED ORDER — SPIRONOLACTONE 50 MG PO TABS: 50.0000 mg | ORAL_TABLET | Freq: Every day | ORAL | 4 refills | Status: DC

## 2023-04-22 MED ORDER — TRETINOIN 0.05 % EX CREA: TOPICAL_CREAM | Freq: Every day | CUTANEOUS | 3 refills | Status: DC

## 2023-04-22 MED ORDER — DOXYCYCLINE HYCLATE 20 MG PO TABS: 40.0000 mg | ORAL_TABLET | Freq: Every day | ORAL | 4 refills | Status: DC

## 2023-04-22 NOTE — Progress Notes (Signed)
 Follow-Up Visit   Subjective  [name] is a 43 y.o. female who presents for the following: Acne face, 17m f/u, Doxycycline  50mg  qd, Spironolactone  25mg  qam, Tretinoin  0.025% qhs, acne improved, check spots face, chest, neck The patient has spots, moles and lesions to be evaluated, some may be new or changing and the patient may have concern these could be cancer.  Patient accompanied by husband.  The following portions of the chart were reviewed this encounter and updated as appropriate: medications, allergies, medical history  Review of Systems:  No other skin or systemic complaints except as noted in HPI or Assessment and Plan.  Objective  Well appearing patient in no apparent distress; mood and affect are within normal limits.   A focused examination was performed of the following areas: Face, neck, chest  Relevant exam findings are noted in the Assessment and Plan.    Assessment & Plan   ACNE VULGARIS face Exam: violaceous macules cheeks few closed comedones face  Chronic and persistent condition with duration or expected duration over one year. Condition is improving with treatment but not currently at goal.   Treatment Plan: D/c Spironolactone  25mg  Start Spironolactone  50mg  1 po qd  D/c Doxycycline  50mg  Start Doxycycline  20mg  2 po qd with food and drink D/C Tretinoin  0.025% cr Start Tretinoin  0.05% cr qhs to face  Spironolactone  can cause increased urination and cause blood pressure to decrease. Please watch for signs of lightheadedness and be cautious when changing position. It can sometimes cause breast tenderness or an irregular period in premenopausal women. It can also increase potassium. The increase in potassium usually is not a concern unless you are taking other medicines that also increase potassium, so please be sure your doctor knows all of the other medications you are taking. This medication should not be taken by pregnant women.  This medicine should  also not be taken together with sulfa drugs like Bactrim (trimethoprim/sulfamethexazole).   Doxycycline  should be taken with food to prevent nausea. Do not lay down for 30 minutes after taking. Be cautious with sun exposure and use good sun protection while on this medication. Pregnant women should not take this medication.   Topical retinoid medications like tretinoin /Retin-A , adapalene/Differin, tazarotene/Fabior, and Epiduo/Epiduo Forte can cause dryness and irritation when first started. Only apply a pea-sized amount to the entire affected area. Avoid applying it around the eyes, edges of mouth and creases at the nose. If you experience irritation, use a good moisturizer first and/or apply the medicine less often. If you are doing well with the medicine, you can increase how often you use it until you are applying every night. Be careful with sun protection while using this medication as it can make you sensitive to the sun. This medicine should not be used by pregnant women.    Sebaceous Hyperplasia face - Small yellow papules with a central dell upper forehead - Benign-appearing - Observe. Call for changes.  Discussed cosmetic procedure ED, noncovered.  $60 for 1st lesion and $15 for each additional lesion if done on the same day.  Maximum charge $350.  One touch-up treatment included no charge. Discussed risks of treatment including dyspigmentation, small scar, and/or recurrence. Recommend daily broad spectrum sunscreen SPF 30+/photoprotection to treated areas once healed.   Acrochordons (Skin Tags) L neck - Fleshy, skin-colored pedunculated papules - Benign appearing.  - Observe. - If desired, they can be removed with an in office procedure that is not covered by insurance. - Please call  the clinic if you notice any new or changing lesions.   FIBROUS PAPULE L nasal ala Exam: 2.34mm firm flesh pap L nasal ala  Treatment: Benign-appearing.  Observation.  Call clinic for new or changing  lesions.  Pt will return for shave removal since irritating  NEVUS vs SK L breast Exam: 4.62mm tan papule  Treatment Plan: Benign-appearing.  Observation.  Call clinic for new or changing lesions.  Recommend daily use of broad spectrum spf 30+ sunscreen to sun-exposed areas.      Return for Shave removal of Fibrous pap, Skin tag L neck, ED to Sebaceous Hyperplasia, 3-34m acne f/u.  I, Rollie Clipper, RMA, am acting as scribe for Artemio Larry, MD .   Documentation: I have reviewed the above documentation for accuracy and completeness, and I agree with the above.  Artemio Larry, MD

## 2023-04-22 NOTE — Patient Instructions (Signed)

## 2023-05-18 ENCOUNTER — Ambulatory Visit: Payer: Medicaid Other | Admitting: Dermatology

## 2023-07-02 ENCOUNTER — Other Ambulatory Visit: Payer: Self-pay | Admitting: Dermatology

## 2023-08-19 ENCOUNTER — Ambulatory Visit
Admission: RE | Admit: 2023-08-19 | Discharge: 2023-08-19 | Disposition: A | Discharge status: Home / Self Care | Payer: Medicaid Other | Source: Ambulatory Visit | Attending: Obstetrics and Gynecology | Admitting: Obstetrics and Gynecology

## 2023-08-19 DIAGNOSIS — Z1231 Encounter for screening mammogram for malignant neoplasm of breast: Secondary | ICD-10-CM | POA: Diagnosis not present

## 2023-08-19 DIAGNOSIS — Z1239 Encounter for other screening for malignant neoplasm of breast: Secondary | ICD-10-CM

## 2023-08-24 ENCOUNTER — Ambulatory Visit: Payer: Self-pay | Admitting: Obstetrics and Gynecology

## 2023-08-25 ENCOUNTER — Telehealth: Payer: Self-pay | Admitting: *Deleted

## 2023-08-25 NOTE — Telephone Encounter (Signed)
 Message left to return call to Triage at [phone], option 4 via [address] [place], [place] #: [id number].

## 2023-08-25 NOTE — Telephone Encounter (Signed)
-----   Message from Palmer V sent at 08/25/2023  8:53 AM EDT ----- Pt lvm requesting call back from provider, has questions about mammogram.

## 2023-08-26 NOTE — Telephone Encounter (Signed)
 I am happy to help the patient with supplemental screening when I see her for her annual exam appointment with me on 09/07/23.   I would like to update her breast cancer risk assessment.  I am unable to find this in her chart.   I see her family history of breast cancer and her negative genetic testing.

## 2023-08-26 NOTE — Telephone Encounter (Signed)
 Pt returned call, unable to hear what VM said.

## 2023-08-26 NOTE — Telephone Encounter (Signed)
 Pt returned call: Per BLV, pt seen/understood mammogram results. However, wants to do the supplemental breast cancer screening that was recommended and done by Dr. Lavoie in the past.   Per EMR investigation: last done on 04/11/2022  Please advise.

## 2023-08-26 NOTE — Telephone Encounter (Signed)
 Used interpreter line: ID [name] #[id number]  [place] to VM x 2. LDVM on machine per DPR advising the pt to cb or send mychart msg with questions/concerns.

## 2023-08-27 NOTE — Telephone Encounter (Signed)
 Per CS: "Detailed message on voicemail/ OK per DPR."  Encounter closed.

## 2023-09-07 ENCOUNTER — Ambulatory Visit (INDEPENDENT_AMBULATORY_CARE_PROVIDER_SITE_OTHER): Payer: Medicaid Other | Admitting: Obstetrics and Gynecology

## 2023-09-07 ENCOUNTER — Encounter: Payer: Self-pay | Admitting: Obstetrics and Gynecology

## 2023-09-07 ENCOUNTER — Other Ambulatory Visit (HOSPITAL_COMMUNITY)
Admission: RE | Admit: 2023-09-07 | Discharge: 2023-09-07 | Disposition: A | Discharge status: Home / Self Care | Source: Ambulatory Visit | Attending: Obstetrics and Gynecology | Admitting: Obstetrics and Gynecology

## 2023-09-07 VITALS — BP 124/82 | HR 102 | Ht 62.5 in | Wt 173.0 lb

## 2023-09-07 DIAGNOSIS — Z114 Encounter for screening for human immunodeficiency virus [HIV]: Secondary | ICD-10-CM

## 2023-09-07 DIAGNOSIS — Z01419 Encounter for gynecological examination (general) (routine) without abnormal findings: Secondary | ICD-10-CM

## 2023-09-07 DIAGNOSIS — Z1159 Encounter for screening for other viral diseases: Secondary | ICD-10-CM | POA: Diagnosis not present

## 2023-09-07 DIAGNOSIS — Z113 Encounter for screening for infections with a predominantly sexual mode of transmission: Secondary | ICD-10-CM | POA: Diagnosis not present

## 2023-09-07 DIAGNOSIS — Z308 Encounter for other contraceptive management: Secondary | ICD-10-CM

## 2023-09-07 DIAGNOSIS — Z0000 Encounter for general adult medical examination without abnormal findings: Secondary | ICD-10-CM

## 2023-09-07 DIAGNOSIS — Z803 Family history of malignant neoplasm of breast: Secondary | ICD-10-CM

## 2023-09-07 DIAGNOSIS — Z8042 Family history of malignant neoplasm of prostate: Secondary | ICD-10-CM | POA: Diagnosis not present

## 2023-09-07 DIAGNOSIS — R7309 Other abnormal glucose: Secondary | ICD-10-CM

## 2023-09-07 DIAGNOSIS — R92343 Mammographic extreme density, bilateral breasts: Secondary | ICD-10-CM

## 2023-09-07 DIAGNOSIS — Z1331 Encounter for screening for depression: Secondary | ICD-10-CM

## 2023-09-07 DIAGNOSIS — Z5320 Procedure and treatment not carried out because of patient's decision for unspecified reasons: Secondary | ICD-10-CM

## 2023-09-07 DIAGNOSIS — R635 Abnormal weight gain: Secondary | ICD-10-CM | POA: Diagnosis not present

## 2023-09-07 NOTE — Progress Notes (Signed)
 43 y.o. [id number] Married Hispanic female here for annual exam.  Would like to discuss IUD removal and reinsertion (Paragard) before October and referral for MRI of breast.   Interpretor is present for the visit today.   Would like to loose weight.  Used Phentermine  in past.  Difficulty sleeping.   Wants STD testing, routine labs.  She has hx elevated triglycerides and elevated glucose, and she is concerned that her labs were done when she was not fasting.   Had intake of coffee only today.   Has 20% lifetime risk of breast cancer on chart review, but she would like further stratification of this risk.  Her mother and maternal grandmother had breast cancer, and her father had prostate cancer.   Her work is to assemble parts for airplanes.    PCP: Marius Siemens, NP   Patient's last menstrual period was 08/26/2023 (approximate).     Period Cycle (Days): 28 (sometimes twice a month) Period Duration (Days): 7-10 Period Pattern: (!) Irregular (sometimes twice a month) Menstrual Flow: Moderate Menstrual Control: Maxi pad Dysmenorrhea: (!) Moderate Dysmenorrhea Symptoms: Cramping     Sexually active: Yes.    The current method of family planning is IUD.   Paragard placed 9/205.  Menopausal hormone therapy:  n/a Exercising: No.    Smoker:  Former  OB History  Gravida Para Term Preterm AB Living  1 1 1   1   SAB IAB Ectopic Multiple Live Births          # Outcome Date GA Lbr Len/2nd Weight Sex Type Anes PTL Lv  1 Term              HEALTH MAINTENANCE: Last 2 paps:  09/05/22 neg HR HPV neg, 08/09/21 neg  History of abnormal Pap or positive HPV:  no Mammogram:   08/19/23 Breast Density Cat D, BIRADS Cat 1 neg  Colonoscopy:  n/a Bone Density:  n/a  Result  n/a   Immunization History  Administered Date(s) Administered   Influenza Whole 01/14/2008   Influenza,inj,Quad PF,6+ Mos 03/13/2014, 01/13/2019   Influenza-Unspecified 01/26/2017      reports that she has quit  smoking. She has never been exposed to tobacco smoke. She has never used smokeless tobacco. She reports current alcohol use. She reports that she does not use drugs.  History reviewed. No pertinent past medical history.  Past Surgical History:  Procedure Laterality Date   CESAREAN SECTION      Current Outpatient Medications  Medication Sig Dispense Refill   Cholecalciferol (VITAMIN D -3 PO) Take by mouth.     Cyanocobalamin (B-12 PO) Take by mouth.     doxycycline  (ADOXA) 50 MG tablet Take one tab po QHS with dinner meal. 90 tablet 0   doxycycline  (PERIOSTAT ) 20 MG tablet Take 2 tablets (40 mg total) by mouth daily. Take with food and drink 60 tablet 4   MAGNESIUM PO Take by mouth.     Multiple Vitamin (MULTIVITAMIN PO) Take by mouth.     spironolactone  (ALDACTONE ) 50 MG tablet Take 1 tablet (50 mg total) by mouth daily. 1 pill a day 30 tablet 4   tretinoin  (RETIN-A ) 0.05 % cream Apply topically at bedtime. Pea size amount to face nightly for acne 45 g 3   phentermine  (ADIPEX-P ) 37.5 MG tablet Take 1 tablet (37.5 mg total) by mouth daily before breakfast. (Patient not taking: Reported on 09/07/2023) 30 tablet 2   No current facility-administered medications for this visit.    ALLERGIES:  Patient has no known allergies.  Family History  Problem Relation Age of Onset   Breast cancer Mother 58       deceased 45   Cancer Father        prostate   Hypertension Father    Breast cancer Maternal Grandmother 64       deceased 75s    Review of Systems  All other systems reviewed and are negative.   PHYSICAL EXAM:  BP 124/82 (BP Location: Left Arm, Patient Position: Sitting)   Pulse (!) 102   Ht 5' 2.5" (1.588 m)   Wt 173 lb (78.5 kg)   LMP 08/26/2023 (Approximate)   SpO2 99%   BMI 31.14 kg/m     General appearance: alert, cooperative and appears stated age Head: normocephalic, without obvious abnormality, atraumatic Neck: no adenopathy, supple, symmetrical, trachea midline and  thyroid  normal to inspection and palpation Lungs: clear to auscultation bilaterally Breasts: normal appearance, no masses or tenderness, No nipple retraction or dimpling, No nipple discharge or bleeding, No axillary adenopathy Heart: regular rate and rhythm Abdomen: soft, non-tender; no masses, no organomegaly Extremities: extremities normal, atraumatic, no cyanosis or edema Skin: skin color, texture, turgor normal. No rashes or lesions Lymph nodes: cervical, supraclavicular, and axillary nodes normal. Neurologic: grossly normal  Pelvic: External genitalia:  no lesions              No abnormal inguinal nodes palpated.              Urethra:  normal appearing urethra with no masses, tenderness or lesions              Bartholins and Skenes: normal                 Vagina: normal appearing vagina with normal color and discharge, no lesions              Cervix: no lesions.  IUD strings noted.               Pap taken: no. Bimanual Exam:  Uterus:  normal size, contour, position, consistency, mobility, non-tender              Adnexa: no mass, fullness, tenderness              Rectal exam: yes.  Confirms.              Anus:  normal sphincter tone, no lesions  Chaperone was present for exam:  Cottie Diss, CMA  ASSESSMENT: Well woman visit with gynecologic exam. FH breast cancer in mother and grandmother.  Family history of prostate cancer in father.  Personal negative genetic testing.  Dense breast tissue.  Category D.  Paragard IUD.  Weight gain.  STD testing.  Elevated A1C.  Routine labs.  PHQ-9: 0  PLAN: Mammogram screening discussed. Self breast awareness reviewed. Pap and HRV collected:  No.  Due in 2029.  Guidelines for Calcium, Vitamin D , regular exercise program including cardiovascular and weight bearing exercise. Medication refills:  NA. Routine labs, A1C, STD screening.  Referral to [facility] for weight loss.  Breast MRI.  Referral back to genetics for risk assessment.   Return for Paragard IUD exchange.  Follow up:  1 year and prn.    20 min  total time was spent for this patient encounter, including preparation, face-to-face counseling with the patient, coordination of care, and documentation of the encounter in addition to doing the routine annual exam.

## 2023-09-07 NOTE — Patient Instructions (Signed)

## 2023-09-08 ENCOUNTER — Telehealth: Payer: Self-pay | Admitting: Obstetrics and Gynecology

## 2023-09-08 LAB — CBC
HCT: 41 % (ref 35.0–45.0)
Hemoglobin: 13.4 g/dL (ref 11.7–15.5)
MCH: 31 pg (ref 27.0–33.0)
MCHC: 32.7 g/dL (ref 32.0–36.0)
MCV: 94.9 fL (ref 80.0–100.0)
MPV: 10.9 fL (ref 7.5–12.5)
Platelets: 301 10*3/uL (ref 140–400)
RBC: 4.32 10*6/uL (ref 3.80–5.10)
RDW: 12.4 % (ref 11.0–15.0)
WBC: 7.2 10*3/uL (ref 3.8–10.8)

## 2023-09-08 LAB — T4, FREE: Free T4: 1.1 ng/dL (ref 0.8–1.8)

## 2023-09-08 LAB — RPR: RPR Ser Ql: NONREACTIVE

## 2023-09-08 LAB — COMPREHENSIVE METABOLIC PANEL WITH GFR
AG Ratio: 1.9 (calc) (ref 1.0–2.5)
ALT: 20 U/L (ref 6–29)
AST: 15 U/L (ref 10–30)
Albumin: 4.8 g/dL (ref 3.6–5.1)
Alkaline phosphatase (APISO): 54 U/L (ref 31–125)
BUN: 9 mg/dL (ref 7–25)
CO2: 24 mmol/L (ref 20–32)
Calcium: 9.6 mg/dL (ref 8.6–10.2)
Chloride: 101 mmol/L (ref 98–110)
Creat: 0.6 mg/dL (ref 0.50–0.99)
Globulin: 2.5 g/dL (ref 1.9–3.7)
Glucose, Bld: 85 mg/dL (ref 65–99)
Potassium: 3.5 mmol/L (ref 3.5–5.3)
Sodium: 136 mmol/L (ref 135–146)
Total Bilirubin: 0.7 mg/dL (ref 0.2–1.2)
Total Protein: 7.3 g/dL (ref 6.1–8.1)
eGFR: 116 mL/min/{1.73_m2} (ref >=60)

## 2023-09-08 LAB — HEPATITIS C ANTIBODY: Hepatitis C Ab: NONREACTIVE

## 2023-09-08 LAB — LIPID PANEL
Cholesterol: 201 mg/dL — ABNORMAL HIGH (ref <200)
HDL: 55 mg/dL (ref >=50)
LDL Cholesterol (Calc): 115 mg/dL — ABNORMAL HIGH
Non-HDL Cholesterol (Calc): 146 mg/dL — ABNORMAL HIGH (ref <130)
Total CHOL/HDL Ratio: 3.7 (calc) (ref <5.0)
Triglycerides: 195 mg/dL — ABNORMAL HIGH (ref <150)

## 2023-09-08 LAB — HEMOGLOBIN A1C
Hgb A1c MFr Bld: 5.5 % (ref <5.7)
Mean Plasma Glucose: 111 mg/dL
eAG (mmol/L): 6.2 mmol/L

## 2023-09-08 LAB — HIV ANTIBODY (ROUTINE TESTING W REFLEX): HIV 1&2 Ab, 4th Generation: NONREACTIVE

## 2023-09-08 LAB — TSH: TSH: 3.27 m[IU]/L

## 2023-09-08 NOTE — Telephone Encounter (Signed)
 Patient needs referral to [facility].  Her BMI is 31.14.   She has weight gain.   I am placing an order also for a breast MRI due to family history of breast cancer and Category D dense breast tissue.

## 2023-09-09 ENCOUNTER — Ambulatory Visit: Payer: Self-pay | Admitting: Obstetrics and Gynecology

## 2023-09-09 ENCOUNTER — Other Ambulatory Visit: Payer: Self-pay | Admitting: Obstetrics and Gynecology

## 2023-09-09 ENCOUNTER — Other Ambulatory Visit: Payer: Self-pay

## 2023-09-09 DIAGNOSIS — R635 Abnormal weight gain: Secondary | ICD-10-CM

## 2023-09-09 LAB — CERVICOVAGINAL ANCILLARY ONLY
Chlamydia: NEGATIVE
Comment: NEGATIVE
Comment: NEGATIVE
Comment: NORMAL
Neisseria Gonorrhea: NEGATIVE
Trichomonas: NEGATIVE

## 2023-09-09 NOTE — Progress Notes (Signed)
Referral to medical weight management

## 2023-09-09 NOTE — Telephone Encounter (Signed)
 Referral placed to healthy weight & wellness.

## 2023-09-27 ENCOUNTER — Other Ambulatory Visit: Payer: Self-pay | Admitting: Dermatology

## 2023-09-28 ENCOUNTER — Ambulatory Visit: Payer: Medicaid Other | Admitting: Dermatology

## 2023-09-28 DIAGNOSIS — D2239 Melanocytic nevi of other parts of face: Secondary | ICD-10-CM

## 2023-09-28 DIAGNOSIS — L700 Acne vulgaris: Secondary | ICD-10-CM

## 2023-09-28 DIAGNOSIS — D492 Neoplasm of unspecified behavior of bone, soft tissue, and skin: Secondary | ICD-10-CM

## 2023-09-28 DIAGNOSIS — L918 Other hypertrophic disorders of the skin: Secondary | ICD-10-CM

## 2023-09-28 DIAGNOSIS — Z792 Long term (current) use of antibiotics: Secondary | ICD-10-CM

## 2023-09-28 DIAGNOSIS — Z79899 Other long term (current) drug therapy: Secondary | ICD-10-CM | POA: Diagnosis not present

## 2023-09-28 DIAGNOSIS — L738 Other specified follicular disorders: Secondary | ICD-10-CM

## 2023-09-28 MED ORDER — TRETINOIN 0.05 % EX CREA: TOPICAL_CREAM | Freq: Every day | CUTANEOUS | 11 refills | Status: AC

## 2023-09-28 NOTE — Patient Instructions (Signed)

## 2023-09-28 NOTE — Progress Notes (Signed)
 Follow-Up Visit   Subjective  Stephenson Jill is a 43 y.o. female who presents for the following: Acne face, Spironolactone  50mg  1 po qd, Doxycycline  20mg  2 po qd, Tretinoin  0.05% cr qhs, acne improved,  Sebaceous Hyperplasia face, ed today, Skin Tag L neck, Fibrous pap L nasal ala shave removal today The patient has spots, moles and lesions to be evaluated, some may be new or changing and the patient may have concern these could be cancer.   The following portions of the chart were reviewed this encounter and updated as appropriate: medications, allergies, medical history  Review of Systems:  No other skin or systemic complaints except as noted in HPI or Assessment and Plan.  Objective  Well appearing patient in no apparent distress; mood and affect are within normal limits.   A focused examination was performed of the following areas: Face, neck  Relevant exam findings are noted in the Assessment and Plan.  L nasal ala 2.19mm firm flesh pap L nasal ala   forehead x 2 (2) Small yellow papules with a central dell L neck x 2 (2) Fleshy, brown-colored pedunculated papules.    Assessment & Plan   ACNE VULGARIS Face BP 113/69 Exam: mild erythema at cheeks, otherwise clear  Chronic condition with duration or expected duration over one year. Currently well-controlled.   Treatment Plan: Cont Spironolactone  50mg  1 po qd Cont Doxycycline  20mg  2 po qd Cont Tretinoin  0.05% cr qhs  Spironolactone  can cause increased urination and cause blood pressure to decrease. Please watch for signs of lightheadedness and be cautious when changing position. It can sometimes cause breast tenderness or an irregular period in premenopausal women. It can also increase potassium. The increase in potassium usually is not a concern unless you are taking other medicines that also increase potassium, so please be sure your doctor knows all of the other medications you are taking. This medication should not  be taken by pregnant women.  This medicine should also not be taken together with sulfa drugs like Bactrim (trimethoprim/sulfamethexazole).   Doxycycline  should be taken with food to prevent nausea. Do not lay down for 30 minutes after taking. Be cautious with sun exposure and use good sun protection while on this medication. Pregnant women should not take this medication.    Topical retinoid medications like tretinoin /Retin-A , adapalene/Differin, tazarotene/Fabior, and Epiduo/Epiduo Forte can cause dryness and irritation when first started. Only apply a pea-sized amount to the entire affected area. Avoid applying it around the eyes, edges of mouth and creases at the nose. If you experience irritation, use a good moisturizer first and/or apply the medicine less often. If you are doing well with the medicine, you can increase how often you use it until you are applying every night. Be careful with sun protection while using this medication as it can make you sensitive to the sun. This medicine should not be used by pregnant women.     NEOPLASM OF SKIN L nasal ala Epidermal / dermal shaving  Lesion diameter (cm):  0.2 Informed consent: discussed and consent obtained   Patient was prepped and draped in usual sterile fashion: area prepped with alcohol. Anesthesia: the lesion was anesthetized in a standard fashion   Anesthetic:  1% lidocaine  w/ epinephrine 1-100,000 buffered w/ 8.4% NaHCO3 Instrument used: flexible razor blade   Hemostasis achieved with: pressure, aluminum chloride and electrodesiccation   Outcome: patient tolerated procedure well   Post-procedure details: wound care instructions given   Post-procedure details comment:  Ointment and small bandage applied Specimen 2 - Surgical pathology Differential Diagnosis: Fibrous Papule vs other  Check Margins: yes 2.70mm firm flesh pap L nasal ala SEBACEOUS HYPERPLASIA forehead x 2 (2) Discussed cosmetic procedure ED, noncovered.  $60 for  1st lesion and $15 for each additional lesion if done on the same day.  Maximum charge $350.  One touch-up treatment included no charge. Discussed risks of treatment including dyspigmentation, small scar, and/or recurrence. Recommend daily broad spectrum sunscreen SPF 30+/photoprotection to treated areas once healed  SKIN TAG (2) L neck x 2 (2) Symptomatic, irritating, patient would like treated.   SKIN TAGS (Acrochordons)  - Benign appearing.  - Symptomatic, irritating, patient would like treated.  Procedure Note: - Prior to the procedure, reviewed the expected small wound. Also reviewed the risk of leaving a small scar and the small risk of infection.  PROCEDURE - The areas were prepped with isopropyl alcohol. A small amount of lidocaine  1% with epinephrine was injected at the base of each lesion to achieve good local anesthesia. The skin tags were removed using a snip technique. Aluminum chloride was used for hemostasis. Petrolatum and a bandage were applied. The procedure was tolerated well. - Wound care was reviewed with the patient. They were advised to call with any concerns.  - Locations: neck - Total number of treated acrochordons 2    Return in about 1 year (around 09/27/2024) for Acne f/u.  I, Grayce Saunas, RMA, am acting as scribe for Jill Rattler, MD .   Documentation: I have reviewed the above documentation for accuracy and completeness, and I agree with the above.  Jill Rattler, MD

## 2023-10-01 ENCOUNTER — Ambulatory Visit (INDEPENDENT_AMBULATORY_CARE_PROVIDER_SITE_OTHER): Admitting: Obstetrics and Gynecology

## 2023-10-01 ENCOUNTER — Encounter: Payer: Self-pay | Admitting: Obstetrics and Gynecology

## 2023-10-01 VITALS — BP 110/70 | HR 78 | Resp 14 | Wt 173.0 lb

## 2023-10-01 DIAGNOSIS — Z30433 Encounter for removal and reinsertion of intrauterine contraceptive device: Secondary | ICD-10-CM

## 2023-10-01 DIAGNOSIS — Z01812 Encounter for preprocedural laboratory examination: Secondary | ICD-10-CM

## 2023-10-01 DIAGNOSIS — R635 Abnormal weight gain: Secondary | ICD-10-CM

## 2023-10-01 DIAGNOSIS — Z308 Encounter for other contraceptive management: Secondary | ICD-10-CM

## 2023-10-01 LAB — PREGNANCY, URINE: Preg Test, Ur: NEGATIVE

## 2023-10-01 LAB — SURGICAL PATHOLOGY

## 2023-10-01 MED ORDER — PHENTERMINE HCL 37.5 MG PO TABS: 37.5000 mg | ORAL_TABLET | Freq: Every day | ORAL | 2 refills | Status: AC

## 2023-10-01 MED ORDER — PARAGARD INTRAUTERINE COPPER IU IUD
1.0000 | INTRAUTERINE_SYSTEM | Freq: Once | INTRAUTERINE | Status: AC
Start: 2023-10-01 — End: 2023-10-01
  Administered 2023-10-01: 1 via INTRAUTERINE

## 2023-10-01 NOTE — Addendum Note (Signed)
 Addended by: MELITON PERKINS T on: 10/01/2023 11:30 AM   Modules accepted: Orders

## 2023-10-01 NOTE — Progress Notes (Signed)
 GYNECOLOGY  VISIT   HPI: 43 y.o.   Married  Hispanic female   G1P1001 with Patient's last menstrual period was 08/26/2023 (approximate).   here for: Paragard IUD exchange.. her IUD was placed 12/2013.  She declined an interpretor today.    Wants weight loss medication.  Had taken Phentermine  tablet in the past without problem.  Her insurance was not accepted by weight loss clinic.  UPT:  neg    GYNECOLOGIC HISTORY: Patient's last menstrual period was 08/26/2023 (approximate). Contraception:  Paragard IUD, placed 2015.  Menopausal hormone therapy:  none Last 2 paps:  09/05/22 negative, HR HPV negative, 08/09/21 negative  History of abnormal Pap or positive HPV:  no Mammogram:  08/19/23 density D/BIRADS 1 negative         OB History     Gravida  1   Para  1   Term  1   Preterm      AB      Living  1      SAB      IAB      Ectopic      Multiple      Live Births                 Patient Active Problem List   Diagnosis Date Noted   Positive ANA (antinuclear antibody) 06/19/2022   Back pain 06/19/2022   Arthritis of left hip 06/19/2022   Pain in right hand 06/19/2022   Dysmenorrhea 03/13/2014   Menorrhagia with regular cycle 03/13/2014   IUD (intrauterine device) in place 12/28/2013   Family history of breast cancer in female 07/14/2013   H. 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  Past Surgical History:  Procedure Laterality Date   CESAREAN SECTION      Current Outpatient Medications  Medication Sig Dispense Refill   Cholecalciferol (VITAMIN D -3 PO) Take by mouth.     Cyanocobalamin (B-12 PO) Take by mouth.     doxycycline  (ADOXA) 50 MG tablet Take one tab po QHS with dinner meal. 90 tablet 0   doxycycline  (PERIOSTAT ) 20 MG tablet TAKE 2 TABLETS BY MOUTH DAILY WITH FOOD AND DRINK 60 tablet  11   MAGNESIUM PO Take by mouth.     Multiple Vitamin (MULTIVITAMIN PO) Take by mouth.     phentermine  (ADIPEX-P ) 37.5 MG tablet Take 1 tablet (37.5 mg total) by mouth daily before breakfast. 30 tablet 2   spironolactone  (ALDACTONE ) 50 MG tablet TAKE 1 TABLET BY MOUTH DAILY 30 tablet 11   tretinoin  (RETIN-A ) 0.05 % cream Apply topically at bedtime. Pea size amount to face nightly for acne 45 g 11   No current facility-administered medications for this visit.     ALLERGIES: Patient has no known allergies.  Family History  Problem Relation Age of Onset   Breast cancer Mother 57       deceased 68   Cancer Father        prostate   Hypertension Father    Breast cancer Maternal Grandmother 36       deceased 32s    Social History   Socioeconomic History   Marital status: Married    Spouse name: Not on file   Number of children: Not on file   Years of education: Not on file   Highest education level: Not on  file  Occupational History   Not on file  Tobacco Use   Smoking status: Former    Passive exposure: Never   Smokeless tobacco: Never  Vaping Use   Vaping status: Never Used  Substance and Sexual Activity   Alcohol use: Yes    Comment: socially   Drug use: No   Sexual activity: Yes    Partners: Male    Birth control/protection: I.U.D.    Comment: 1si intercourse-21, partners- 5+, -Paragard inserted 2015  Other Topics Concern   Not on file  Social History Narrative   Not on file   Social Drivers of Health   Financial Resource Strain: Not on file  Food Insecurity: Not on file  Transportation Needs: Not on file  Physical Activity: Not on file  Stress: Not on file  Social Connections: Not on file  Intimate Partner Violence: Not on file    Review of Systems  All other systems reviewed and are negative.   PHYSICAL EXAMINATION:   BP 110/70   Pulse 78   Resp 14   Wt 173 lb (78.5 kg)   LMP 08/26/2023 (Approximate)   BMI 31.14 kg/m     General appearance:  alert, cooperative and appears stated age   Pelvic: External genitalia:  no lesions              Urethra:  normal appearing urethra with no masses, tenderness or lesions              Bartholins and Skenes: normal                 Vagina: normal appearing vagina with normal color and discharge, no lesions              Cervix: no lesions                Bimanual Exam:  Uterus:  normal size, contour, position, consistency, mobility, non-tender              Adnexa: no mass, fullness, tenderness         Procedure - IUD removal and reinsertion of Paragard IUD.     Paragard IUD - lot number  [id number], exp July 2027. Consent and time out for procedure.  Speculum placed in vagina.  Sterile prep of cervix with Hibiclens.  IUD removed with forceps, intact, shown to patient, and discarded.  Paracervical block with 10 cc 1% lidocaine , lot number [id number], exp 12/2024. Tenaculum to anterior cervical lip.  Uterus sounded to 7.5 cm.  Paragard placed without difficulty. Strings trimmed. Bimanual exam repeated. No change.  Silver nitrate to tenaculum site.  Good hemostasis. Minimal EBL.  No complications.    Chaperone was present for exam:  Damien BROCKS, RN  ASSESSMENT:  Paragard IUD exchange.  Weight gain.   PLAN:  Paragard IUD instruction brochure to patient.  Abstain from sex for one week. FU in 4 weeks for IUD check, medication and weight check.  IUD removal due in 10 years.    In addition to IUD exchange, patient received a prescription for Phentermine  37.5 mg tablet daily.  We discussed potential for elevated blood pressure and need for office follow up in 4 weeks and monthly.

## 2023-10-03 ENCOUNTER — Inpatient Hospital Stay
Admission: RE | Admit: 2023-10-03 | Discharge: 2023-10-03 | Discharge status: Home / Self Care | Source: Ambulatory Visit | Attending: Obstetrics and Gynecology | Admitting: Obstetrics and Gynecology

## 2023-10-03 DIAGNOSIS — Z1239 Encounter for other screening for malignant neoplasm of breast: Secondary | ICD-10-CM | POA: Diagnosis not present

## 2023-10-03 DIAGNOSIS — R92343 Mammographic extreme density, bilateral breasts: Secondary | ICD-10-CM

## 2023-10-03 DIAGNOSIS — Z803 Family history of malignant neoplasm of breast: Secondary | ICD-10-CM

## 2023-10-03 MED ORDER — GADOPICLENOL 0.5 MMOL/ML IV SOLN
8.0000 mL | Freq: Once | INTRAVENOUS | Status: AC | PRN
  Administered 2023-10-03: 8 mL via INTRAVENOUS

## 2023-10-05 ENCOUNTER — Ambulatory Visit: Payer: Self-pay | Admitting: Dermatology

## 2023-10-05 NOTE — Telephone Encounter (Signed)
-----   Message from Rexene Rattler sent at 10/05/2023  8:41 AM EDT ----- 1. Skin, L nasal ala :       FIBROUS PAPULE   Benign - please call patient ----- Message ----- From: Interface, [facility] Sent: 10/01/2023   4:18 PM EDT To: Rexene Rattler, MD

## 2023-10-05 NOTE — Telephone Encounter (Signed)
 Advised pt of bx results/sh ?

## 2023-10-27 ENCOUNTER — Encounter (INDEPENDENT_AMBULATORY_CARE_PROVIDER_SITE_OTHER): Payer: Self-pay

## 2023-10-30 ENCOUNTER — Ambulatory Visit: Admitting: Dietician

## 2023-11-04 ENCOUNTER — Ambulatory Visit: Admitting: Obstetrics and Gynecology

## 2023-11-04 ENCOUNTER — Encounter: Payer: Self-pay | Admitting: Obstetrics and Gynecology

## 2023-11-04 VITALS — BP 114/76 | HR 101 | Wt 163.0 lb

## 2023-11-04 DIAGNOSIS — Z5181 Encounter for therapeutic drug level monitoring: Secondary | ICD-10-CM | POA: Diagnosis not present

## 2023-11-04 DIAGNOSIS — Z30431 Encounter for routine checking of intrauterine contraceptive device: Secondary | ICD-10-CM | POA: Diagnosis not present

## 2023-11-04 NOTE — Progress Notes (Signed)
 GYNECOLOGY  VISIT   HPI: 43 y.o.   Married  Hispanic female   G1P1001 with No LMP recorded. (Menstrual status: IUD).   here for: IUD Check   No problems with IUD.  No bleeding.  No pain.  Has not had a period yet.   Taking Phentermine  for one month.  Thinks she has lost weight but she has not weighed at home.  Wants to continue the medication.    Sleeping less.     Changed her diet.    GYNECOLOGIC HISTORY: No LMP recorded. (Menstrual status: IUD). Contraception:  IUD - Paragard  Menopausal hormone therapy:  n/a Last 2 paps:  09/05/22 neg HR HPV neg, 08/09/21 neg  History of abnormal Pap or positive HPV:  no Mammogram:  08/19/23 Breast Density Cat D, BIRADS Cat 1 neg         OB History     Gravida  1   Para  1   Term  1   Preterm      AB      Living  1      SAB      IAB      Ectopic      Multiple      Live Births                 Patient Active Problem List   Diagnosis Date Noted   Positive ANA (antinuclear antibody) 06/19/2022   Back pain 06/19/2022   Arthritis of left hip 06/19/2022   Pain in right hand 06/19/2022   Dysmenorrhea 03/13/2014   Menorrhagia with regular cycle 03/13/2014   IUD (intrauterine device) in place 12/28/2013   Family history of breast cancer in female 07/14/2013   H. pylori infection 06/23/2013   GERD (gastroesophageal reflux disease) 06/22/2013   Visit for preventive health examination 06/22/2013   ANEMIA, MILD 07/24/2008    History reviewed. No pertinent past medical history.  Past Surgical History:  Procedure Laterality Date   CESAREAN SECTION      Current Outpatient Medications  Medication Sig Dispense Refill   Cholecalciferol (VITAMIN D -3 PO) Take by mouth.     Cyanocobalamin (B-12 PO) Take by mouth.     doxycycline  (ADOXA) 50 MG tablet Take one tab po QHS with dinner meal. 90 tablet 0   doxycycline  (PERIOSTAT ) 20 MG tablet TAKE 2 TABLETS BY MOUTH DAILY WITH FOOD AND DRINK 60 tablet 11   MAGNESIUM PO Take  by mouth.     Multiple Vitamin (MULTIVITAMIN PO) Take by mouth.     phentermine  (ADIPEX-P ) 37.5 MG tablet Take 1 tablet (37.5 mg total) by mouth daily before breakfast. 30 tablet 2   spironolactone  (ALDACTONE ) 50 MG tablet TAKE 1 TABLET BY MOUTH DAILY 30 tablet 11   tretinoin  (RETIN-A ) 0.05 % cream Apply topically at bedtime. Pea size amount to face nightly for acne 45 g 11   No current facility-administered medications for this visit.     ALLERGIES: Patient has no known allergies.  Family History  Problem Relation Age of Onset   Breast cancer Mother 46       deceased 62   Cancer Father        prostate   Hypertension Father    Breast cancer Maternal Grandmother 52       deceased 94s    Social History   Socioeconomic History   Marital status: Married    Spouse name: Not on file   Number of children: Not on file  Years of education: Not on file   Highest education level: Not on file  Occupational History   Not on file  Tobacco Use   Smoking status: Former    Passive exposure: Never   Smokeless tobacco: Never  Vaping Use   Vaping status: Never Used  Substance and Sexual Activity   Alcohol use: Yes    Comment: socially   Drug use: No   Sexual activity: Yes    Partners: Male    Birth control/protection: I.U.D.    Comment: 1si intercourse-21, partners- 5+, -Paragard  inserted 2015  Other Topics Concern   Not on file  Social History Narrative   Not on file   Social Drivers of Health   Financial Resource Strain: Not on file  Food Insecurity: Not on file  Transportation Needs: Not on file  Physical Activity: Not on file  Stress: Not on file  Social Connections: Not on file  Intimate Partner Violence: Not on file    Review of Systems  All other systems reviewed and are negative.   PHYSICAL EXAMINATION:   BP 114/76 (BP Location: Left Arm, Patient Position: Sitting)   Pulse (!) 101   Wt 163 lb (73.9 kg)   SpO2 100%   BMI 29.34 kg/m      General  appearance: alert, cooperative and appears stated age Lungs:  CTA bilaterally.  Cor:  S1S2 borderline tachycardia.  Pelvic: External genitalia:  no lesions              Urethra:  normal appearing urethra with no masses, tenderness or lesions              Bartholins and Skenes: normal                 Vagina: normal appearing vagina with normal color and discharge, no lesions              Cervix: no lesions.  IUD strings noted.                 Bimanual Exam:  Uterus:  normal size, contour, position, consistency, mobility, non-tender              Adnexa: no mass, fullness, tenderness    Chaperone was present for exam:  Kari HERO, CMA  ASSESSMENT:  IUD check up.  Encounter for medication monitoring.  Weight loss on Phentermine .  Borderline tachycardia.   PLAN:  Reassurance regarding IUD position.  Ok to continue Phentermine  37.5 mg daily.  She will monitor her heart rate and stop the medication if it is over 100 beats per minute.  She has an Apple watch.   Return visit in 4 weeks.

## 2023-11-30 ENCOUNTER — Encounter: Payer: Self-pay | Admitting: Genetic Counselor

## 2023-11-30 ENCOUNTER — Inpatient Hospital Stay: Attending: Internal Medicine | Admitting: Genetic Counselor

## 2023-11-30 ENCOUNTER — Inpatient Hospital Stay

## 2023-11-30 DIAGNOSIS — Z1379 Encounter for other screening for genetic and chromosomal anomalies: Secondary | ICD-10-CM | POA: Diagnosis not present

## 2023-11-30 DIAGNOSIS — Z8042 Family history of malignant neoplasm of prostate: Secondary | ICD-10-CM

## 2023-11-30 DIAGNOSIS — Z803 Family history of malignant neoplasm of breast: Secondary | ICD-10-CM | POA: Diagnosis not present

## 2023-11-30 LAB — GENETIC SCREENING ORDER

## 2023-11-30 NOTE — Progress Notes (Signed)
 REFERRING PROVIDER: Cathlyn JAYSON Nikki Bobie FORBES, MD [address] [place],  [place] [address]  PRIMARY PROVIDER:  Celestia Rosaline SQUIBB, NP  PRIMARY REASON FOR VISIT:  1. Family history of malignant neoplasm of breast   2. Family history of malignant neoplasm of prostate      HISTORY OF PRESENT ILLNESS:   Ms. Edmunds, a 43 y.o. female, was seen for a [facility] genetics consultation at the request of Dr. Cathlyn JAYSON Nikki due to a family history of cancer.  Ms. Lyons presents to clinic today to discuss the possibility of a hereditary predisposition to cancer, to discuss genetic testing, and to further clarify her future cancer risks, as well as potential cancer risks for family members. [employer] interpreter, Koren, was present for this visit. Ms. Trego son was also present.   Ms. Reid is a 43 y.o. female with no personal history of cancer.  She had negative BRCA1/2 testing in 2015 through [facility] that was normal.   RELEVANT MEDICAL HISTORY:  Menarche was at age 80.  First live birth at age 20.  OCP use for approximately 0 years. Does have an IUD, paragard .  Ovaries intact: yes.  Uterus intact: yes.  Menopausal status: premenopausal.  HRT use: 0 years. Colonoscopy: no; not examined. Mammogram within the last year: yes. Extremely dense  Number of breast biopsies: 1.  No past medical history on file.  Past Surgical History:  Procedure Laterality Date   CESAREAN SECTION      Social History   Socioeconomic History   Marital status: Married    Spouse name: Not on file   Number of children: Not on file   Years of education: Not on file   Highest education level: Not on file  Occupational History   Not on file  Tobacco Use   Smoking status: Former    Passive exposure: Never   Smokeless tobacco: Never  Vaping Use   Vaping status: Never Used  Substance and Sexual Activity   Alcohol use: Yes    Comment: socially   Drug use: No   Sexual activity: Yes     Partners: Male    Birth control/protection: I.U.D.    Comment: 1si intercourse-21, partners- 5+, -Paragard  inserted 2015  Other Topics Concern   Not on file  Social History Narrative   Not on file   Social Drivers of Health   Financial Resource Strain: Not on file  Food Insecurity: Not on file  Transportation Needs: Not on file  Physical Activity: Not on file  Stress: Not on file  Social Connections: Not on file     FAMILY HISTORY:  We obtained a detailed, 4-generation family history.  Significant diagnoses are listed below: Family History  Problem Relation Age of Onset   Breast cancer Mother 36       deceased 39   Hypertension Father    Prostate cancer Father 70       metastatic   Breast cancer Maternal Grandmother 61 - 84       d. 36    Ms. Riesgo is believes her maternal half sister had negative genetic testing within the last few months. There is no reported Ashkenazi Jewish ancestry.      GENETIC COUNSELING ASSESSMENT: Ms. Baller is a 43 y.o. female with a family history of cancer which is suggestive of a hereditary predisposition to cancer given the family history of a first degree and second degree maternal relative with breast cancer under  50 and a first degree paternal relative with metastatic prostate cancer. We, therefore, discussed and recommended the following at today's visit.   DISCUSSION: We discussed that 5 - 10% of cancer is hereditary, with most cases of hereditary breast cancer associated with BRCA1/2 pathogenic variants .  There are other genes that can be associated with hereditary breast cancer syndromes that were not included in Ms. Shakoor's 2015 genetic test.  We discussed that testing is beneficial for several reasons, including knowing about other cancer risks, identifying potential screening and risk-reduction options that may be appropriate, and to understanding if other family members could be at risk for cancer and allowing them to undergo genetic  testing.  We reviewed the characteristics, features and inheritance patterns of hereditary cancer syndromes. We also discussed genetic testing, including the appropriate family members to test, the process of testing, insurance coverage and turn-around-time for results. We discussed the implications of a negative, positive, carrier and/or variant of uncertain significant result. We discussed that negative results would be uninformative given that Ms. Mandella does not have a personal history of cancer. We recommended Ms. Dunn pursue genetic testing for a panel that contains genes associated with breast cancer.  Ms. Romito was offered a common hereditary cancer panel (40 genes) and an expanded pan-cancer panel (77 genes). Ms. Boardman was informed of the benefits and limitations of each panel, including that expanded pan-cancer panels contain several genes that do not have clear management guidelines at this point in time.  We also discussed that as the number of genes included on a panel increases, the chances of variants of uncertain significance increases.  After considering the benefits and limitations of each gene panel, Ms. Boy elected to have [facility] CancerNext +RNAinsight panel. The [facility] CancerNext+RNAinsight Panel includes sequencing, rearrangement analysis, and RNA analysis for the following 40 genes: APC, ATM, BAP1, BARD1, BMPR1A, BRCA1, BRCA2, BRIP1, CDH1, CDKN2A, CHEK2, FH, FLCN, MET, MLH1, MSH2, MSH6, MUTYH, NF1, NTHL1, PALB2, PMS2, PTEN, RAD51C, RAD51D, RPS20, SMAD4, STK11, TP53, TSC1, TSC2 and VHL (sequencing and deletion/duplication); AXIN2, HOXB13, MBD4, MSH3, POLD1 and POLE (sequencing only); EPCAM and GREM1 (deletion/duplication only).    Based on Ms. File's personal and family history of cancer, she meets medical criteria for genetic testing. Though Ms. Jakob is not personally affected, there are no affected family members that are willing/able to undergo hereditary cancer testing. Despite  that she meets criteria, she may still have an out of pocket cost. We discussed that if her out of pocket cost for testing is over $100, the laboratory should contact them to discuss self-pay prices, patient pay assistance programs, if applicable, and other billing options.  We discussed that some people do not want to undergo genetic testing due to fear of genetic discrimination.  A federal law called the Genetic Information Non-Discrimination Act (GINA) of 2008 helps protect individuals against genetic discrimination based on their genetic test results.  It impacts both health insurance and employment.  With health insurance, it protects against increased premiums, being kicked off insurance or being forced to take a test in order to be insured.  For employment it protects against hiring, firing and promoting decisions based on genetic test results.  GINA does not apply to those in the [employer], those who work for companies with less than 15 employees, and new life insurance or long-term disability insurance policies.  Health status due to a cancer diagnosis is not protected under GINA.  PLAN: After considering the risks, benefits, and limitations, Ms. Doughman  provided informed consent to pursue genetic testing and the blood sample was sent to [facility] for analysis of the CancerNext +RNAinsight test. Results should be available within approximately 2-3 weeks' time, at which point they will be disclosed by telephone to Ms. Creegan, as will any additional recommendations warranted by these results. Ms. Friesen will receive a summary of her genetic counseling visit and a copy of her results once available. This information will also be available in Epic.   Lastly, we encouraged Ms. Starner to remain in contact with cancer genetics annually so that we can continuously update the family history and inform her of any changes in cancer genetics and testing that may be of benefit for this family.   Ms.  Wroe questions were answered to her satisfaction today. Our contact information was provided should additional questions or concerns arise. Thank you for the referral and allowing us  to share in the care of your patient.   Burnard Ogren, MS, [facility] Licensed, Retail [email or web address] phone: [phone]   55 minutes were spent on the date of the encounter in service to the patient including preparation, face-to-face consultation, documentation and care coordination.  The patient brought her son to this appointment.  Drs. Gudena and/or Lanny were available to discuss this case as needed.  _______________________________________________________________________ For Office Staff:  Number of people involved in session: 2 Was an Intern/ student involved with case: no

## 2023-12-15 ENCOUNTER — Telehealth: Payer: Self-pay | Admitting: Genetic Counselor

## 2023-12-15 NOTE — Telephone Encounter (Signed)
 Interpreter Melissa [phone]. LVM asking for call back to review results of genetic testing.

## 2023-12-23 ENCOUNTER — Ambulatory Visit: Admitting: Obstetrics and Gynecology

## 2024-01-12 ENCOUNTER — Ambulatory Visit: Admitting: Medical

## 2024-01-12 ENCOUNTER — Encounter: Payer: Self-pay | Admitting: Medical

## 2024-01-12 VITALS — BP 110/70 | HR 73 | Temp 98.5°F | Resp 16 | Ht 62.5 in | Wt 157.0 lb

## 2024-01-12 DIAGNOSIS — E559 Vitamin D deficiency, unspecified: Secondary | ICD-10-CM

## 2024-01-12 DIAGNOSIS — E785 Hyperlipidemia, unspecified: Secondary | ICD-10-CM | POA: Diagnosis not present

## 2024-01-12 DIAGNOSIS — R739 Hyperglycemia, unspecified: Secondary | ICD-10-CM | POA: Diagnosis not present

## 2024-01-12 DIAGNOSIS — L709 Acne, unspecified: Secondary | ICD-10-CM | POA: Diagnosis not present

## 2024-01-12 DIAGNOSIS — Z0184 Encounter for antibody response examination: Secondary | ICD-10-CM

## 2024-01-12 NOTE — Patient Instructions (Signed)
 Acne Chronic acne managed with spironolactone , doxycycline , and tretinoin . Tretinoin  monitored for teratogenic effects, not a concern due to IUD and no interest in more children.  Overweight Weight decreased from 171 lbs to 157 lbs with dietary changes. Phentermine  discontinued due to tachycardia. Occasional postprandial tachycardia persists. Let me know if pulse goes over 125. - Order comprehensive metabolic panel. - Order lipid panel. - Order hemoglobin A1c.  Vitamin D  deficiency Chronic deficiency managed with supplementation. Recent levels unavailable, supplementation continues. - Order vitamin D  level. future fasing.  High cholesterol  -future lipid panel fasting  Follow up date to be determined after lab review.

## 2024-01-12 NOTE — Progress Notes (Signed)
   Subjective:    Patient ID: [name], female    DOB: 1980-06-20, 43 y.o.   MRN: [id number]  HPI  Pt in for first time.  [name] is a 43 year old female who presents for evaluation of her current medications and symptoms.  She has a history of vitamin D  deficiency and regularly takes vitamin D  supplements, though she is unsure of her current levels. She also takes vitamin B12 supplements without a diagnosed deficiency.  She takes spironolactone , doxycycline , and tretinoin  for acne as recommended by her dermatologist. She has an IUD in place and reports that her doctors check for pregnancy before prescribing tretinoin , as it is not recommended during pregnancy. She has no interest in having more children.  She was prescribed phentermine  in June for weight loss, which she took for one month before discontinuing due to heart palpitations. Her weight decreased from 171 to 157 pounds during this period, and she maintains her weight through dietary changes, including reduced carbohydrate intake.  She has a history of elevated blood sugar levels, previously in the prediabetes range, with a past average of 118. Her levels have improved, with a recent HbA1c of 5.5, achieved before starting phentermine .  She experiences occasional heart palpitations, particularly after eating. Her heart rate can exceed 100 bpm at times, as monitored by her watch. But max just about 105.  She drinks one cup of decaffeinated coffee daily and exercises infrequently due to her work schedule. 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 She consumes alcohol occasionally, mainly during social events, and quit smoking two years ago after a brief period of smoking daily.  She reports not sleeping much and has a 20 year old child. Her thyroid  function was checked in June and was normal.   Review of Systems See hpi    Objective:   Physical Exam  General Mental Status- Alert. General  Appearance- Not in acute distress.   Skin General: Color- Normal Color. Moisture- Normal Moisture.  Neck Carotid Arteries- Normal color. Moisture- Normal Moisture. No carotid bruits. No JVD.  Chest and Lung Exam Auscultation: Breath Sounds:-CTA  Cardiovascular Auscultation:Rythm- RRR Murmurs & Other Heart Sounds:Auscultation of the heart reveals- No Murmurs.  Abdomen Inspection:-Inspeection Normal. Palpation/Percussion:Note:No mass. Palpation and Percussion of the abdomen reveal- Non Tender, Non Distended + BS, no rebound or guarding.    Neurologic Cranial Nerve exam:- CN III-XII intact(No nystagmus), symmetric smile. Drift Test:- No drift. Romberg Exam:- Negative.  Heal to Toe Gait exam:-Normal. Finger to Nose:- Normal/Intact Strength:- 5/5 equal and symmetric strength both upper and lower extremities.       Assessment & Plan:   Patient Instructions  Acne Chronic acne managed with spironolactone , doxycycline , and tretinoin . Tretinoin  monitored for teratogenic effects, not a concern due to IUD and no interest in more children.  Overweight Weight decreased from 171 lbs to 157 lbs with dietary changes. Phentermine  discontinued due to tachycardia. Occasional postprandial tachycardia persists. Let me know if pulse goes over 125. - Order comprehensive metabolic panel. - Order lipid panel. - Order hemoglobin A1c.  Vitamin D  deficiency Chronic deficiency managed with supplementation. Recent levels unavailable, supplementation continues. - Order vitamin D  level. future fasing.  High cholesterol  -future lipid panel fasting  Follow up date to be determined after lab review.   Jill Bryngelson, PA-C

## 2024-01-15 NOTE — Telephone Encounter (Signed)
 Interpreter [place]ada [phone]. LVM asking for call back to review results of genetic testing.

## 2024-01-22 ENCOUNTER — Ambulatory Visit: Payer: Self-pay | Admitting: Genetic Counselor

## 2024-01-22 ENCOUNTER — Other Ambulatory Visit

## 2024-01-22 DIAGNOSIS — Z1379 Encounter for other screening for genetic and chromosomal anomalies: Secondary | ICD-10-CM | POA: Insufficient documentation

## 2024-01-22 DIAGNOSIS — E785 Hyperlipidemia, unspecified: Secondary | ICD-10-CM | POA: Diagnosis not present

## 2024-01-22 DIAGNOSIS — E559 Vitamin D deficiency, unspecified: Secondary | ICD-10-CM

## 2024-01-22 DIAGNOSIS — R739 Hyperglycemia, unspecified: Secondary | ICD-10-CM | POA: Diagnosis not present

## 2024-01-22 DIAGNOSIS — Z0184 Encounter for antibody response examination: Secondary | ICD-10-CM

## 2024-01-22 LAB — COMPREHENSIVE METABOLIC PANEL WITH GFR
ALT: 14 U/L (ref 0–35)
AST: 12 U/L (ref 0–37)
Albumin: 4.9 g/dL (ref 3.5–5.2)
Alkaline Phosphatase: 42 U/L (ref 39–117)
BUN: 8 mg/dL (ref 6–23)
CO2: 29 meq/L (ref 19–32)
Calcium: 9.4 mg/dL (ref 8.4–10.5)
Chloride: 103 meq/L (ref 96–112)
Creatinine, Ser: 0.58 mg/dL (ref 0.40–1.20)
GFR: 112.81 mL/min (ref >60.00)
Glucose, Bld: 88 mg/dL (ref 70–99)
Potassium: 4.2 meq/L (ref 3.5–5.1)
Sodium: 140 meq/L (ref 135–145)
Total Bilirubin: 0.8 mg/dL (ref 0.2–1.2)
Total Protein: 7.4 g/dL (ref 6.0–8.3)

## 2024-01-22 LAB — LIPID PANEL
Cholesterol: 176 mg/dL (ref 0–200)
HDL: 54.3 mg/dL (ref >39.00)
LDL Cholesterol: 103 mg/dL — ABNORMAL HIGH (ref 0–99)
NonHDL: 122.12
Total CHOL/HDL Ratio: 3
Triglycerides: 96 mg/dL (ref 0.0–149.0)
VLDL: 19.2 mg/dL (ref 0.0–40.0)

## 2024-01-22 LAB — VITAMIN D 25 HYDROXY (VIT D DEFICIENCY, FRACTURES): VITD: 40.1 ng/mL (ref 30.00–100.00)

## 2024-01-22 LAB — HEMOGLOBIN A1C: Hgb A1c MFr Bld: 5.7 % (ref 4.6–6.5)

## 2024-01-22 LAB — HEPATITIS B SURFACE ANTIBODY, QUANTITATIVE: Hep B S AB Quant (Post): <5 m[IU]/mL — ABNORMAL LOW (ref >=10)

## 2024-01-22 NOTE — Telephone Encounter (Signed)
 Interpreter [phone]. LVM asking for call back to review results of genetic testing.   Unable to reach patient to discuss results of genetic testing after three call attempts. Will release results to [name] via [employer] and share with the referring provider.   [name] had genetic testing with [facility] CancerNext +RNAinsight panel. 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 We do not use VUS to make management decisions or identify at risk family members.   Calculated the [employer] breast cancer risk model. [name] has been determined to be at high risk for breast cancer. her Tyrer-Cuzick risk score is 33.9%.  For women with a greater than 20% lifetime risk of breast cancer, the [employer] (NCCN) recommends the following:   Clinical encounter every 6-12 months to begin when identified as being at increased risk, but not before age 21  Annual mammograms. Tomosynthesis is recommended starting 10 years earlier than the youngest breast cancer diagnosis in the family or at age 14 (whichever comes first), but not before age 51   Annual breast MRI starting 10 years earlier than the youngest breast cancer diagnosis in the family or at age 74 (whichever comes first), but not before age 50.   We recommend that [name] to be followed by a high-risk breast cancer clinic; in addition to a yearly mammogram and physical exam by a healthcare provider, she should discuss the usefulness of an annual breast MRI with the high-risk clinic providers.

## 2024-01-22 NOTE — Telephone Encounter (Signed)
 Received call from Nikola and her husband Hudson. Discussed results and recommendations. Plan to discuss with Isaac's primary car provider regarding increased breast cancer surveillance.

## 2024-01-22 NOTE — Progress Notes (Signed)
 HPI:  [name] was previously seen in the [facility] due to a family history of cancer and concerns regarding a hereditary predisposition to cancer. Please refer to our prior cancer genetics clinic note for more information regarding our discussion, assessment and recommendations, at the time. This note serves to summaries [name]'s recent genetic test results  and recommendations warranted by these results. We have been trying to reach [name] by phone to discuss but have been able to reach her. Results and this note will be sent to [name] and her referring provider, Cathlyn JAYSON Nikki Bobie FORBES, MD, via myChart and the EMR.   FAMILY HISTORY:  We obtained a detailed, 4-generation family history.  Significant diagnoses are listed below: Family History  Problem Relation Age of Onset   Breast cancer Mother 66       deceased 4   Hypertension Father    Prostate cancer Father 7       metastatic   Breast cancer Maternal Grandmother 35 - 73       d. 2    Ms. Bryars is believes her maternal half sister had negative genetic testing within the last few months. There is no reported Ashkenazi Jewish ancestry.      GENETIC TEST RESULTS: Genetic testing reported out on 12/09/23 through the [facility] +RNAinsight panel found no pathogenic mutations. The [facility] CancerNext+RNAinsight Panel includes sequencing, rearrangement analysis, and RNA analysis for the following 40 genes: APC, ATM, BAP1, BARD1, BMPR1A, BRCA1, BRCA2, BRIP1, CDH1, CDKN2A, CHEK2, FH, FLCN, MET, MLH1, MSH2, MSH6, MUTYH, NF1, NTHL1, PALB2, PMS2, PTEN, RAD51C, RAD51D, RPS20, SMAD4, STK11, TP53, TSC1, TSC2 and VHL (sequencing and deletion/duplication); AXIN2, HOXB13, MBD4, MSH3, POLD1 and POLE (sequencing only); EPCAM and GREM1 (deletion/duplication only).. The test report has been scanned into EPIC and is located under the Molecular Pathology section of the Results Review tab.  A portion of the result report is  included below for reference.     Current genetic testing is not perfect, it is possible there may be a gene mutation in one of these genes that current testing cannot detect, but that chance is small.  There could be another gene that has not yet been discovered, or that we have not yet tested, that is responsible for the cancer diagnoses in the family. It is also possible there is a hereditary cause for the cancer in the family that [name] did not inherit and therefore was not identified in her testing.  Therefore, it is important to remain in touch with cancer genetics in the future so that we can continue to offer [name] the most up to date genetic testing.   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  At this time, it is unknown if this variant is associated with increased cancer risk or if this is a normal finding, but most variants such as this get reclassified to being inconsequential. It should not be used to make medical management decisions. With time, we suspect the lab will determine the significance of this variant, if any. If we do learn more about it, we will try to contact Ms. Vasseur to discuss it further. However, it is important to stay in touch with us  periodically and keep the address and phone number up to date.  ADDITIONAL GENETIC TESTING: There are other genes that are associated with increased cancer risk that can be analyzed.  Should [name] wish to pursue additional genetic testing, we are happy to discuss and coordinate this testing, at any time.    CANCER SCREENING RECOMMENDATIONS: Ms. Old's test result is considered negative (normal).  This means that we have not identified a hereditary cause for her family history of cancer at this time. Most cancers happen by chance and this negative test suggests that her family history of cancer may fall into this category.    Possible reasons  for [name]'s negative genetic test include:  1. There may be a gene mutation in one of these genes that current testing methods cannot detect but that chance is small.  2. There could be another gene that has not yet been discovered, or that we have not yet tested, that is responsible for the cancer diagnoses in the family.  3.  There may be no hereditary risk for cancer in the family. The cancers in [name] and/or her family may be sporadic/familial or due to other genetic and environmental factors. 4. It is also possible there is a hereditary cause for the cancer in the family that [name] did not inherit.  Therefore, it is recommended she continue to follow the cancer management and screening guidelines provided by her primary healthcare provider. An individual's cancer risk and medical management are not determined by genetic test results alone. Overall cancer risk assessment incorporates additional factors, including personal medical history, family history, and any available genetic information that may result in a personalized plan for cancer prevention and surveillance  Given Ms. Cavitt's family history, we must interpret these negative results with some caution.  Families with features suggestive of hereditary risk for cancer tend to have multiple family members with cancer, diagnoses in multiple generations and diagnoses before the age of 59. [name]'s family exhibits some of these features. Thus, this result may simply reflect our current inability to detect all mutations within these genes or there may be a different gene that has not yet been discovered or tested.   [name] has been determined to be at high risk for breast cancer. her Tyrer-Cuzick risk score is 33.9%.  For women with a greater than 20% lifetime risk of breast cancer, the [employer] (NCCN) recommends the following:   Clinical encounter every 6-12 months to begin when identified as being at  increased risk, but not before age 83  Annual mammograms. Tomosynthesis is recommended starting 10 years earlier than the youngest breast cancer diagnosis in the family or at age 68 (whichever comes first), but not before age 32   Annual breast MRI starting 10 years earlier than the youngest breast cancer diagnosis in the family or at age 41 (whichever comes first), but not before age 61.   It iss reasonable for [name] to be followed by a high-risk breast cancer clinic; in addition to a yearly mammogram and physical exam by a healthcare provider, she should discuss the usefulness of an annual breast MRI with the high-risk clinic providers.      RECOMMENDATIONS FOR FAMILY MEMBERS:  Individuals in this family might be at some increased risk of developing cancer, over the general population risk, simply due to the family history of cancer.  We recommended women in this family have a yearly mammogram beginning at age 77, or 46 years younger than the earliest onset of cancer, an annual clinical breast exam, and perform monthly breast self-exams. Women in this family should also have a gynecological exam as  recommended by their primary provider. All family members should be referred for colonoscopy starting at age 38, or 29 years younger than the earliest onset of cancer.   It is also possible there is a hereditary cause for the cancer in [name]'s family that she did not inherit and therefore was not identified in her. Other relatives may still benefit from completing genetic testing to assess their own risk for cancer based on the family history.   FOLLOW-UP: Cancer genetics is a rapidly advancing field and it is possible that new genetic tests will be appropriate for her and/or her family members in the future. We encouraged her to remain in contact with cancer genetics on an annual basis so we can update her personal and family histories and let her know of advances in cancer genetics that may  benefit this family.   [name] is welcome to call us  at anytime with additional questions or concerns.   Burnard Ogren, MS, [facility] Licensed, Retail [email or web address] [phone]

## 2024-01-23 ENCOUNTER — Ambulatory Visit: Payer: Self-pay | Admitting: Medical

## 2024-01-25 ENCOUNTER — Telehealth: Payer: Self-pay

## 2024-01-25 NOTE — Telephone Encounter (Signed)
 Called pt to schedule nv for hep b vaccine pt is scheduled for 10/29

## 2024-01-25 NOTE — Telephone Encounter (Signed)
 Copied from CRM [id number]. Topic: Clinical - Request for Lab/Test Order >> Jan 25, 2024 10:36 AM [name] wrote: Reason for CRM: Patient is requesting to get her HepB shot. She stated the doctor wanted to get the shot but he didn't put the order in. Callback number [phone]

## 2024-01-31 ENCOUNTER — Encounter: Payer: Self-pay | Admitting: Obstetrics and Gynecology

## 2024-02-03 ENCOUNTER — Ambulatory Visit

## 2024-02-03 DIAGNOSIS — Z23 Encounter for immunization: Secondary | ICD-10-CM | POA: Diagnosis not present

## 2024-02-03 NOTE — Progress Notes (Signed)
 Patient here today for heplisav- b , immunization tolerated well

## 2024-03-29 ENCOUNTER — Encounter: Payer: Self-pay | Admitting: Obstetrics and Gynecology

## 2024-09-27 ENCOUNTER — Ambulatory Visit: Admitting: Dermatology
